# Patient Record
Sex: Male | Born: 1945 | Race: White | Hispanic: No | Marital: Married | Smoking: Former smoker
Health system: Southern US, Community
[De-identification: ages and names within clinical notes are randomized; demographics above are authoritative.]

## PROBLEM LIST (undated history)

## (undated) DIAGNOSIS — F4310 Post-traumatic stress disorder, unspecified: Secondary | ICD-10-CM

## (undated) DIAGNOSIS — E119 Type 2 diabetes mellitus without complications: Secondary | ICD-10-CM

## (undated) DIAGNOSIS — K7689 Other specified diseases of liver: Secondary | ICD-10-CM

## (undated) DIAGNOSIS — R911 Solitary pulmonary nodule: Secondary | ICD-10-CM

## (undated) DIAGNOSIS — K644 Residual hemorrhoidal skin tags: Secondary | ICD-10-CM

## (undated) DIAGNOSIS — M778 Other enthesopathies, not elsewhere classified: Secondary | ICD-10-CM

## (undated) DIAGNOSIS — M47812 Spondylosis without myelopathy or radiculopathy, cervical region: Secondary | ICD-10-CM

## (undated) DIAGNOSIS — I700 Atherosclerosis of aorta: Secondary | ICD-10-CM

## (undated) DIAGNOSIS — Z860100 Personal history of colon polyps, unspecified: Secondary | ICD-10-CM

## (undated) DIAGNOSIS — K759 Inflammatory liver disease, unspecified: Secondary | ICD-10-CM

## (undated) DIAGNOSIS — C4491 Basal cell carcinoma of skin, unspecified: Secondary | ICD-10-CM

## (undated) DIAGNOSIS — I10 Essential (primary) hypertension: Secondary | ICD-10-CM

## (undated) DIAGNOSIS — I639 Cerebral infarction, unspecified: Secondary | ICD-10-CM

## (undated) DIAGNOSIS — F419 Anxiety disorder, unspecified: Secondary | ICD-10-CM

## (undated) DIAGNOSIS — M4802 Spinal stenosis, cervical region: Secondary | ICD-10-CM

## (undated) DIAGNOSIS — M1990 Unspecified osteoarthritis, unspecified site: Secondary | ICD-10-CM

## (undated) DIAGNOSIS — R252 Cramp and spasm: Secondary | ICD-10-CM

## (undated) DIAGNOSIS — M7581 Other shoulder lesions, right shoulder: Secondary | ICD-10-CM

## (undated) DIAGNOSIS — C61 Malignant neoplasm of prostate: Secondary | ICD-10-CM

## (undated) DIAGNOSIS — G4730 Sleep apnea, unspecified: Secondary | ICD-10-CM

## (undated) DIAGNOSIS — M5127 Other intervertebral disc displacement, lumbosacral region: Secondary | ICD-10-CM

## (undated) DIAGNOSIS — I739 Peripheral vascular disease, unspecified: Secondary | ICD-10-CM

## (undated) DIAGNOSIS — I6529 Occlusion and stenosis of unspecified carotid artery: Secondary | ICD-10-CM

## (undated) DIAGNOSIS — G709 Myoneural disorder, unspecified: Secondary | ICD-10-CM

## (undated) DIAGNOSIS — M5030 Other cervical disc degeneration, unspecified cervical region: Secondary | ICD-10-CM

## (undated) HISTORY — DX: Type 2 diabetes mellitus without complications: E11.9

## (undated) HISTORY — PX: TRIGGER FINGER RELEASE: SHX641

## (undated) HISTORY — PX: EYE SURGERY: SHX253

## (undated) HISTORY — DX: Occlusion and stenosis of unspecified carotid artery: I65.29

---

## 1964-05-18 HISTORY — PX: OTHER SURGICAL HISTORY: SHX169

## 2001-02-17 ENCOUNTER — Other Ambulatory Visit: Admission: RE | Admit: 2001-02-17 | Discharge: 2001-02-17 | Payer: Self-pay | Admitting: Dermatology

## 2002-10-13 ENCOUNTER — Encounter: Payer: Self-pay | Admitting: Family Medicine

## 2002-10-13 ENCOUNTER — Ambulatory Visit (HOSPITAL_COMMUNITY): Admission: RE | Admit: 2002-10-13 | Discharge: 2002-10-13 | Payer: Self-pay | Admitting: Family Medicine

## 2005-05-22 DIAGNOSIS — K219 Gastro-esophageal reflux disease without esophagitis: Secondary | ICD-10-CM | POA: Insufficient documentation

## 2009-05-18 HISTORY — PX: COLONOSCOPY W/ POLYPECTOMY: SHX1380

## 2009-06-17 ENCOUNTER — Other Ambulatory Visit: Admission: RE | Admit: 2009-06-17 | Discharge: 2009-06-17 | Payer: Self-pay | Admitting: Orthopaedic Surgery

## 2009-08-13 ENCOUNTER — Ambulatory Visit (HOSPITAL_COMMUNITY): Admission: RE | Admit: 2009-08-13 | Discharge: 2009-08-13 | Payer: Self-pay | Admitting: Family Medicine

## 2011-07-09 ENCOUNTER — Encounter (HOSPITAL_COMMUNITY): Payer: Self-pay | Admitting: Pharmacy Technician

## 2011-07-14 ENCOUNTER — Encounter (HOSPITAL_COMMUNITY): Payer: Self-pay

## 2011-07-14 ENCOUNTER — Encounter (HOSPITAL_COMMUNITY)
Admission: RE | Admit: 2011-07-14 | Discharge: 2011-07-14 | Disposition: A | Discharge status: Home / Self Care | Payer: Medicare Other | Source: Ambulatory Visit | Attending: Ophthalmology | Admitting: Ophthalmology

## 2011-07-14 HISTORY — DX: Essential (primary) hypertension: I10

## 2011-07-14 HISTORY — DX: Sleep apnea, unspecified: G47.30

## 2011-07-14 HISTORY — DX: Cramp and spasm: R25.2

## 2011-07-14 HISTORY — DX: Unspecified osteoarthritis, unspecified site: M19.90

## 2011-07-14 LAB — HEMOGLOBIN AND HEMATOCRIT, BLOOD
HCT: 48.6 % (ref 39.0–52.0)
Hemoglobin: 17 g/dL (ref 13.0–17.0)

## 2011-07-14 LAB — BASIC METABOLIC PANEL
Calcium: 10.9 mg/dL — ABNORMAL HIGH (ref 8.4–10.5)
Creatinine, Ser: 0.93 mg/dL (ref 0.50–1.35)
GFR calc Af Amer: >90 mL/min (ref >90)
GFR calc non Af Amer: 85 mL/min — ABNORMAL LOW (ref >90)

## 2011-07-14 MED ORDER — CYCLOPENTOLATE-PHENYLEPHRINE 0.2-1 % OP SOLN
OPHTHALMIC | Status: AC
  Filled 2011-07-14: qty 2

## 2011-07-14 NOTE — Progress Notes (Signed)
07/14/11 1131  OBSTRUCTIVE SLEEP APNEA  Have you ever been diagnosed with sleep apnea through a sleep study? No  Do you snore loudly (loud enough to be heard through closed doors)?  1  Do you often feel tired, fatigued, or sleepy during the daytime? 0  Has anyone observed you stop breathing during your sleep? 1  Do you have, or are you being treated for high blood pressure? 1  BMI more than 35 kg/m2? 0  Age over 68 years old? 1  Neck circumference greater than 40 cm/18 inches? 0  Gender: 1  Obstructive Sleep Apnea Score 5

## 2011-07-14 NOTE — Pre-Procedure Instructions (Signed)
Pt had a recent EKG from Jan 2013 done at Dr. Lamar Blinks office that he brought with him to pre-op interview. It was shown to Dr. Jayme Cloud and he said he was fine with [place] using it. The pt's label sticker was placed on the EKG and it was put with the pt's chart for his upcoming cataract surgery.

## 2011-07-14 NOTE — Patient Instructions (Signed)
Robert Zhang  07/14/2011   Your procedure is scheduled on:  07/20/11  Report to [facility] at 09:00 AM.  Call this number if you have problems the morning of surgery: [phone]   Remember:   Do not eat food:After Midnight.  May have clear liquids:until Midnight .  Clear liquids include soda, tea, black coffee, apple or grape juice, broth.  Take these medicines the morning of surgery with A SIP OF WATER: Benicar-HCT.   Do not wear jewelry, make-up or nail polish.  Do not wear lotions, powders, or perfumes. You may wear deodorant.  Do not shave 48 hours prior to surgery.  Do not bring valuables to the hospital.  Contacts, dentures or bridgework may not be worn into surgery.  Leave suitcase in the car. After surgery it may be brought to your room.  For patients admitted to the hospital, checkout time is 11:00 AM the day of discharge.   Patients discharged the day of surgery will not be allowed to drive home.  Name and phone number of your driver:   Special Instructions: CHG Shower Use Special Wash: 1/2 bottle night before surgery and 1/2 bottle morning of surgery.   Please read over the following fact sheets that you were given: Anesthesia Post-op Instructions and Care and Recovery After Surgery    Cataract A cataract is a clouding of the lens of the eye. It is most often related to aging. A cataract is not a "film" over the surface of the eye. The lens is inside the eye and changes size of the pupil. The lens can enlarge to let more light enter the eye in dark environments and contract the size of the pupil to let in bright light. The lens is the part of the eye that helps focus light on the retina. The retina is the eye's light-sensitive layer. It is in the back of the eye that sends visual signals to the brain. In a normal eye, light passes through the lens and gets focused on the retina. To help produce a sharp image, the lens must remain clear. When a lens becomes cloudy, vision is  compromised by the degree and nature of the clouding. Certain cataracts make people more near-sighted as they develop, others increase glare, and all reduce vision to some degree or another. A cataract that is so dense that it becomes milky Robert Zhang and a Robert Zhang opacity can be seen through the pupil. When the Robert Zhang color is seen, it is called a "mature" or "hyper-mature cataract." Such cataracts cause total blindness in the affected eye. The cataract must be removed to prevent damage to the eye itself. Some types of cataracts can cause a secondary disease of the eye, such as certain types of glaucoma. In the early stages, better lighting and eyeglasses may lessen vision problems caused by cataracts. At a certain point, surgery may be needed to improve vision. CAUSES   Aging. However, cataracts may occur at any age, even in newborns.   Certain drugs.   Trauma to the eye.   Certain diseases (such as diabetes).   Inherited or acquired medical syndromes.  SYMPTOMS   Gradual, progressive drop in vision in the affected eye. Cataracts may develop at different rates in each eye. Cataracts may even be in just one eye with the other unaffected.   Cataracts due to trauma may develop quickly, sometimes over a matter or days or even hours. The result is severe and rapid visual loss.  DIAGNOSIS  To detect a cataract, an eye doctor examines the lens. A well developed cataract can be diagnosed without dilating the pupil. Early cataracts and others of a specific nature are best diagnosed with an exam of the eyes with the pupils dilated by drops. TREATMENT   For an early cataract, vision may improve by using different eyeglasses or stronger lighting.   If the above measures do not help, surgery is the only effective treatment. This treatment removes the cloudy lens and replaces it with a substitute lens (Intraocular lens, or IOL). Newly developed IOL technology allows the implanted lens to improve vision both at a  distance and up close. Discuss with your eye surgeon about the possibility of still needing glasses. Also discuss how visual coordination between both eyes will be affected.  A cataract needs to be removed only when vision loss interferes with your everyday activities such as driving, reading or watching TV. You and your eye doctor can make that decision together. In most cases, waiting until you are ready to have cataract surgery will not harm your eye. If you have cataracts in both eyes, only one should be removed at a time. This allows the operated eye to heal and be out of danger from serious problems (such as infection or poor wound healing) before having the other eye undergo surgery.  Sometimes, a cataract should be removed even if it does not cause problems with your vision. For example, a cataract should be removed if it prevents examination or treatment of another eye problem. Just as you cannot see out of the affected eye well, your doctor cannot see into your eye well through a cataract. The vast majority of people who have cataract surgery have better vision afterward. CATARACT REMOVAL There are two primary ways to remove a cataract. Your doctor can explain the differences and help determine which is best for you:  Phacoemulsification (small incision cataract surgery). This involves making a small cut (incision) on the edge of the clear, dome-shaped surface that covers the front of the eye (the cornea). An injection behind the eye or eye drops are given to make this a painless procedure. The doctor then inserts a tiny probe into the eye. This device emits ultrasound waves that soften and break up the cloudy center of the lens so it can be removed by suction. Most cataract surgery is done this way. The cuts are usually so small and performed in such a manner that often no sutures are needed to keep it closed.   Extracapsular surgery. Your doctor makes a slightly longer incision on the side of  the cornea. The doctor removes the hard center of the lens. The remainder of the lens is then removed by suction. In some cases, extremely fine sutures are needed which the doctor may, or may not remove in the office after the surgery.  When an IOL is implanted, it needs no care. It becomes a permanent part of your eye and cannot be seen or felt.  Some people cannot have an IOL. They may have problems during surgery, or maybe they have another eye disease. For these people, a soft contact lens may be suggested. If an IOL or contact lens cannot be used, very powerful and thick glasses are required after surgery. Since vision is very different through such thick glasses, it is important to have your doctor discuss the impact on your vision after any cataract surgery where there is no plan to implant an IOL. The normal lens  of the eye is covered by a clear capsule. Both phacoemulsification and extracapsular surgery require that the back surface of this lens capsule be left in place. This helps support IOLs and prevents the IOL from dislocating and falling back into the deeper interior of the eye. Right after surgery, and often permanently this "posterior capsule" remains clear. In some cases however, it can become cloudy, presenting the same type of visual compromise that the original cataract did since light is again obstructed as it passes through the clear IOL. This condition is often referred to as an "after-cataract." Fortunately, after-cataracts are easily treated using a painless and very fast laser treatment that is performed without anesthesia or incisions. It is done in a matter of minutes in an outpatient environment. Visual improvement is often immediate.  HOME CARE INSTRUCTIONS   Your surgeon will discuss pre and post operative care with you prior to surgery. The majority of people are able to do almost all normal activities right away. Although, it is often advised to avoid strenuous activity for a  period of time.   Postoperative drops and careful avoidance of infection will be needed. Many surgeons suggest the use of a protective shield during the first few days after surgery.   There is a very small incidence of complication from modern cataract surgery, but it can happen. Infection that spreads to the inside of the eye (endophthalmitis) can result in total visual loss and even loss of the eye itself. In extremely rare instances, the inflammation of endophthalmitis can spread to both eyes (sympathetic ophthalmia). Appropriate post-operative care under the close observation of your surgeon is essential to a successful outcome.  SEEK IMMEDIATE MEDICAL CARE IF:   You have any sudden drop of vision in the operated eye.   You have pain in the operated eye.   You see a large number of floating dots in the field of vision in the operated eye.   You see flashing lights, or if a portion of your side vision in any direction appears black (like a curtain being drawn into your field of vision) in the operated eye.  Document Released: 05/04/2005 Document Revised: 01/14/2011 Document Reviewed: 06/20/2007 [facility] Patient Information 2012 [place], [place].   PATIENT INSTRUCTIONS POST-ANESTHESIA  IMMEDIATELY FOLLOWING SURGERY:  Do not drive or operate machinery for the first twenty four hours after surgery.  Do not make any important decisions for twenty four hours after surgery or while taking narcotic pain medications or sedatives.  If you develop intractable nausea and vomiting or a severe headache please notify your doctor immediately.  FOLLOW-UP:  Please make an appointment with your surgeon as instructed. You do not need to follow up with anesthesia unless specifically instructed to do so.  WOUND CARE INSTRUCTIONS (if applicable):  Keep a dry clean dressing on the anesthesia/puncture wound site if there is drainage.  Once the wound has quit draining you may leave it open to air.  Generally you  should leave the bandage intact for twenty four hours unless there is drainage.  If the epidural site drains for more than 36-48 hours please call the anesthesia department.  QUESTIONS?:  Please feel free to call your physician or the hospital operator if you have any questions, and they will be happy to assist you.     [facility] Anesthesia Department [address] [place] [phone]

## 2011-07-20 ENCOUNTER — Ambulatory Visit (HOSPITAL_COMMUNITY): Payer: Medicare Other | Admitting: Anesthesiology

## 2011-07-20 ENCOUNTER — Encounter (HOSPITAL_COMMUNITY): Payer: Self-pay

## 2011-07-20 ENCOUNTER — Encounter (HOSPITAL_COMMUNITY)
Admission: RE | Disposition: A | Discharge status: Home / Self Care | Payer: Self-pay | Source: Ambulatory Visit | Attending: Ophthalmology

## 2011-07-20 ENCOUNTER — Ambulatory Visit (HOSPITAL_COMMUNITY)
Admission: RE | Admit: 2011-07-20 | Discharge: 2011-07-20 | Disposition: A | Discharge status: Home / Self Care | Payer: Medicare Other | Source: Ambulatory Visit | Attending: Ophthalmology | Admitting: Ophthalmology

## 2011-07-20 ENCOUNTER — Encounter (HOSPITAL_COMMUNITY): Payer: Self-pay | Admitting: Anesthesiology

## 2011-07-20 DIAGNOSIS — Z79899 Other long term (current) drug therapy: Secondary | ICD-10-CM | POA: Insufficient documentation

## 2011-07-20 DIAGNOSIS — I10 Essential (primary) hypertension: Secondary | ICD-10-CM | POA: Insufficient documentation

## 2011-07-20 DIAGNOSIS — Z01812 Encounter for preprocedural laboratory examination: Secondary | ICD-10-CM | POA: Insufficient documentation

## 2011-07-20 DIAGNOSIS — H2510 Age-related nuclear cataract, unspecified eye: Secondary | ICD-10-CM | POA: Insufficient documentation

## 2011-07-20 DIAGNOSIS — Z01810 Encounter for preprocedural cardiovascular examination: Secondary | ICD-10-CM | POA: Insufficient documentation

## 2011-07-20 HISTORY — PX: CATARACT EXTRACTION W/PHACO: SHX586

## 2011-07-20 SURGERY — PHACOEMULSIFICATION, CATARACT, WITH IOL INSERTION
Anesthesia: Monitor Anesthesia Care | Site: Eye | Laterality: Right | Wound class: Clean

## 2011-07-20 MED ORDER — FENTANYL CITRATE 0.05 MG/ML IJ SOLN
INTRAMUSCULAR | Status: AC
  Filled 2011-07-20: qty 2

## 2011-07-20 MED ORDER — EPINEPHRINE HCL 1 MG/ML IJ SOLN
INTRAMUSCULAR | Status: AC
  Filled 2011-07-20: qty 1

## 2011-07-20 MED ORDER — BSS IO SOLN
INTRAOCULAR | Status: DC | PRN
  Administered 2011-07-20: 15 mL via INTRAOCULAR

## 2011-07-20 MED ORDER — MIDAZOLAM HCL 2 MG/2ML IJ SOLN
1.0000 mg | INTRAMUSCULAR | Status: DC | PRN
  Administered 2011-07-20 (×2): 2 mg via INTRAVENOUS

## 2011-07-20 MED ORDER — MIDAZOLAM HCL 2 MG/2ML IJ SOLN
INTRAMUSCULAR | Status: AC
  Filled 2011-07-20: qty 2

## 2011-07-20 MED ORDER — NA HYALUR & NA CHOND-NA HYALUR 0.55-0.5 ML IO KIT
PACK | INTRAOCULAR | Status: DC | PRN
  Administered 2011-07-20: 1 via OPHTHALMIC

## 2011-07-20 MED ORDER — CYCLOPENTOLATE-PHENYLEPHRINE 0.2-1 % OP SOLN
1.0000 [drp] | Freq: Once | OPHTHALMIC | Status: AC
  Administered 2011-07-20: 1 [drp] via OPHTHALMIC

## 2011-07-20 MED ORDER — LIDOCAINE HCL 3.5 % OP GEL
OPHTHALMIC | Status: AC
  Filled 2011-07-20: qty 5

## 2011-07-20 MED ORDER — GATIFLOXACIN 0.5 % OP SOLN OPTIME - NO CHARGE
OPHTHALMIC | Status: DC | PRN
  Administered 2011-07-20: 1 [drp] via OPHTHALMIC

## 2011-07-20 MED ORDER — MIDAZOLAM HCL 2 MG/2ML IJ SOLN
INTRAMUSCULAR | Status: AC
  Administered 2011-07-20: 2 mg via INTRAVENOUS
  Filled 2011-07-20: qty 2

## 2011-07-20 MED ORDER — LIDOCAINE HCL 3.5 % OP GEL: 1.0000 "application " | Freq: Once | OPHTHALMIC | Status: DC

## 2011-07-20 MED ORDER — TETRACAINE HCL 0.5 % OP SOLN
1.0000 [drp] | Freq: Once | OPHTHALMIC | Status: AC
  Administered 2011-07-20: 1 [drp] via OPHTHALMIC

## 2011-07-20 MED ORDER — FENTANYL CITRATE 0.05 MG/ML IJ SOLN
INTRAMUSCULAR | Status: DC | PRN
  Administered 2011-07-20 (×4): 25 ug via INTRAVENOUS

## 2011-07-20 MED ORDER — TETRACAINE HCL 0.5 % OP SOLN
OPHTHALMIC | Status: AC
  Administered 2011-07-20: 1 [drp] via OPHTHALMIC
  Filled 2011-07-20: qty 2

## 2011-07-20 MED ORDER — KETOROLAC TROMETHAMINE 0.4 % OP SOLN - NO CHARGE
1.0000 [drp] | Freq: Once | OPHTHALMIC | Status: AC
  Administered 2011-07-20: 1 [drp] via OPHTHALMIC
  Filled 2011-07-20: qty 5

## 2011-07-20 MED ORDER — LIDOCAINE 3.5 % OP GEL OPTIME - NO CHARGE
OPHTHALMIC | Status: DC | PRN
  Administered 2011-07-20: 1 [drp] via OPHTHALMIC

## 2011-07-20 MED ORDER — LACTATED RINGERS IV SOLN
INTRAVENOUS | Status: DC
  Administered 2011-07-20: 08:00:00 via INTRAVENOUS

## 2011-07-20 MED ORDER — TETRACAINE 0.5 % OP SOLN OPTIME - NO CHARGE
OPHTHALMIC | Status: DC | PRN
  Administered 2011-07-20: 1 [drp] via OPHTHALMIC

## 2011-07-20 MED ORDER — GATIFLOXACIN 0.5 % OP SOLN OPTIME - NO CHARGE
1.0000 [drp] | Freq: Once | OPHTHALMIC | Status: AC
  Administered 2011-07-20: 1 [drp] via OPHTHALMIC
  Filled 2011-07-20: qty 2.5

## 2011-07-20 MED ORDER — EPINEPHRINE HCL 1 MG/ML IJ SOLN
INTRAOCULAR | Status: DC | PRN
  Administered 2011-07-20: 09:00:00

## 2011-07-20 SURGICAL SUPPLY — 27 items
CAPSULAR TENSION RING-AMO (OPHTHALMIC RELATED) IMPLANT
CLOTH BEACON ORANGE TIMEOUT ST (SAFETY) ×2 IMPLANT
GLOVE BIO SURGEON STRL SZ7.5 (GLOVE) IMPLANT
GLOVE BIOGEL M 6.5 STRL (GLOVE) IMPLANT
GLOVE BIOGEL PI IND STRL 6.5 (GLOVE) IMPLANT
GLOVE BIOGEL PI IND STRL 7.0 (GLOVE) ×1 IMPLANT
GLOVE BIOGEL PI INDICATOR 6.5 (GLOVE)
GLOVE BIOGEL PI INDICATOR 7.0 (GLOVE) ×1
GLOVE ECLIPSE 6.5 STRL STRAW (GLOVE) IMPLANT
GLOVE ECLIPSE 7.5 STRL STRAW (GLOVE) IMPLANT
GLOVE EXAM NITRILE LRG STRL (GLOVE) IMPLANT
GLOVE EXAM NITRILE MD LF STRL (GLOVE) ×2 IMPLANT
GLOVE SKINSENSE NS SZ6.5 (GLOVE)
GLOVE SKINSENSE NS SZ7.0 (GLOVE)
GLOVE SKINSENSE STRL SZ6.5 (GLOVE) IMPLANT
GLOVE SKINSENSE STRL SZ7.0 (GLOVE) IMPLANT
INST SET CATARACT ~~LOC~~ (KITS) ×2 IMPLANT
KIT VITRECTOMY (OPHTHALMIC RELATED) IMPLANT
PAD ARMBOARD 7.5X6 YLW CONV (MISCELLANEOUS) ×2 IMPLANT
PROC W NO LENS (INTRAOCULAR LENS)
PROC W SPEC LENS (INTRAOCULAR LENS)
PROCESS W NO LENS (INTRAOCULAR LENS) IMPLANT
PROCESS W SPEC LENS (INTRAOCULAR LENS) IMPLANT
RING MALYGIN (MISCELLANEOUS) IMPLANT
SIGHTPATH CAT PROC W REG LENS (Ophthalmic Related) ×2 IMPLANT
VISCOELASTIC ADDITIONAL (OPHTHALMIC RELATED) IMPLANT
WATER STERILE IRR 250ML POUR (IV SOLUTION) ×2 IMPLANT

## 2011-07-20 NOTE — Anesthesia Procedure Notes (Signed)
Procedure Name: MAC Date/Time: 07/20/2011 8:29 AM Performed by: Minerva Areola Pre-anesthesia Checklist: Patient identified, Emergency Drugs available, Suction available, Timeout performed and Patient being monitored Patient Re-evaluated:Patient Re-evaluated prior to inductionOxygen Delivery Method: Nasal Cannula

## 2011-07-20 NOTE — Op Note (Signed)
See scanned op note done in another system

## 2011-07-20 NOTE — Transfer of Care (Signed)
Immediate Anesthesia Transfer of Care Note  Patient: Robert Zhang  Procedure(s) Performed: Procedure(s) (LRB): CATARACT EXTRACTION PHACO AND INTRAOCULAR LENS PLACEMENT (IOC) (Right)  Patient Location: Shortstay  Anesthesia Type: MAC  Level of Consciousness: awake  Airway & Oxygen Therapy: Patient Spontanous Breathing   Post-op Assessment: Report given to PACU RN, Post -op Vital signs reviewed and stable and Patient moving all extremities  Post vital signs: Reviewed and stable  Complications: No apparent anesthesia complications   

## 2011-07-20 NOTE — Anesthesia Preprocedure Evaluation (Signed)
Anesthesia Evaluation  Patient identified by MRN, date of birth, ID band Patient awake    Airway Mallampati: II      Dental  (+) Teeth Intact   Pulmonary  breath sounds clear to auscultation        Cardiovascular Rhythm:Regular Rate:Normal     Neuro/Psych    GI/Hepatic   Endo/Other    Renal/GU      Musculoskeletal   Abdominal   Peds  Hematology   Anesthesia Other Findings   Reproductive/Obstetrics                           Anesthesia Physical Anesthesia Plan  ASA: II  Anesthesia Plan: MAC   Post-op Pain Management:    Induction: Intravenous  Airway Management Planned: Nasal Cannula  Additional Equipment:   Intra-op Plan:   Post-operative Plan:   Informed Consent: I have reviewed the patients History and Physical, chart, labs and discussed the procedure including the risks, benefits and alternatives for the proposed anesthesia with the patient or authorized representative who has indicated his/her understanding and acceptance.     Plan Discussed with:   Anesthesia Plan Comments:         Anesthesia Quick Evaluation

## 2011-07-20 NOTE — Brief Op Note (Signed)
07/20/2011  9:19 AM  PATIENT:  Cleda Mccreedy  66 y.o. male  PRE-OPERATIVE DIAGNOSIS:  Surgical Cataract Right Eye  POST-OPERATIVE DIAGNOSIS:  Surgical Cataract Right Eye  PROCEDURE:  Procedure(s): CATARACT EXTRACTION PHACO AND INTRAOCULAR LENS PLACEMENT (IOC)  SURGEON:  Surgeon(s): Susa Simmonds, MD  ASSISTANTS: Valda Lamb, CST   ANESTHESIA STAFF: Minerva Areola, CRNA - CRNA Laurene Footman, MD - Anesthesiologist  ANESTHESIA:   topical/MAC  REQUESTED LENS POWER: 22.0  LENS IMPLANT INFORMATION:  Alcon SN60 WF  S/n:  [id number]  Exp 09/2015  CUMULATIVE DISSIPATED ENERGY:25.73  INDICATIONS:  See H&P  OP FINDINGS:dense NS  COMPLICATIONS:None  DICTATION #: see scanned op note  PLAN OF CARE: see H&P  PATIENT DISPOSITION:  Short Stay

## 2011-07-20 NOTE — Anesthesia Postprocedure Evaluation (Deleted)
Immediate Anesthesia Transfer of Care Note  Patient: Robert Zhang  Procedure(s) Performed: Procedure(s) (LRB): CATARACT EXTRACTION PHACO AND INTRAOCULAR LENS PLACEMENT (IOC) (Right)  Patient Location: Shortstay  Anesthesia Type: MAC  Level of Consciousness: awake  Airway & Oxygen Therapy: Patient Spontanous Breathing   Post-op Assessment: Report given to PACU RN, Post -op Vital signs reviewed and stable and Patient moving all extremities  Post vital signs: Reviewed and stable  Complications: No apparent anesthesia complications

## 2011-07-20 NOTE — H&P (Signed)
The patient was examined and interviewed without significant changes.  Please see full H&P from office.

## 2011-07-20 NOTE — Anesthesia Postprocedure Evaluation (Signed)
  Anesthesia Post-op Note  Patient: Robert Zhang  Procedure(s) Performed: Procedure(s) (LRB): CATARACT EXTRACTION PHACO AND INTRAOCULAR LENS PLACEMENT (IOC) (Right)  Patient Location:  Short Stay  Anesthesia Type: MAC  Level of Consciousness: awake  Airway and Oxygen Therapy: Patient Spontanous Breathing  Post-op Pain: none  Post-op Assessment: Post-op Vital signs reviewed, Patient's Cardiovascular Status Stable, Respiratory Function Stable, Patent Airway, No signs of Nausea or vomiting and Pain level controlled  Post-op Vital Signs: Reviewed and stable  Complications: No apparent anesthesia complications

## 2011-07-22 ENCOUNTER — Encounter (HOSPITAL_COMMUNITY): Payer: Self-pay | Admitting: Ophthalmology

## 2011-08-21 ENCOUNTER — Encounter (HOSPITAL_COMMUNITY): Payer: Self-pay

## 2011-08-24 NOTE — Patient Instructions (Addendum)
18 Robert Zhang  08/24/2011   Your procedure is scheduled on:   08/31/2011  Report to [facility] at  800  AM.  Call this number if you have problems the morning of surgery: [phone]   Remember:   Do not eat food:After Midnight.  May have clear liquids:until Midnight .  Clear liquids include soda, tea, black coffee, apple or grape juice, broth.  Take these medicines the morning of surgery with A SIP OF WATER:  benicar   Do not wear jewelry, make-up or nail polish.  Do not wear lotions, powders, or perfumes. You may wear deodorant.  Do not shave 48 hours prior to surgery.  Do not bring valuables to the hospital.  Contacts, dentures or bridgework may not be worn into surgery.  Leave suitcase in the car. After surgery it may be brought to your room.  For patients admitted to the hospital, checkout time is 11:00 AM the day of discharge.   Patients discharged the day of surgery will not be allowed to drive home.  Name and phone number of your driver: family  Special Instructions: N/A   Please read over the following fact sheets that you were given: Pain Booklet, Surgical Site Infection Prevention, Anesthesia Post-op Instructions and Care and Recovery After Surgery Cataract A cataract is a clouding of the lens of the eye. When a lens becomes cloudy, vision is reduced based on the degree and nature of the clouding. Many cataracts reduce vision to some degree. Some cataracts make people more near-sighted as they develop. Other cataracts increase glare. Cataracts that are ignored and become worse can sometimes look white. The white color can be seen through the pupil. CAUSES   Aging. However, cataracts may occur at any age, even in newborns.   Certain drugs.   Trauma to the eye.   Certain diseases such as diabetes.   Specific eye diseases such as chronic inflammation inside the eye or a sudden attack of a rare form of glaucoma.   Inherited or acquired medical problems.  SYMPTOMS    Gradual, progressive drop in vision in the affected eye.   Severe, rapid visual loss. This most often happens when trauma is the cause.  DIAGNOSIS  To detect a cataract, an eye doctor examines the lens. Cataracts are best diagnosed with an exam of the eyes with the pupils enlarged (dilated) by drops.  TREATMENT  For an early cataract, vision may improve by using different eyeglasses or stronger lighting. If that does not help your vision, surgery is the only effective treatment. A cataract needs to be surgically removed when vision loss interferes with your everyday activities, such as driving, reading, or watching TV. A cataract may also have to be removed if it prevents examination or treatment of another eye problem. Surgery removes the cloudy lens and usually replaces it with a substitute lens (intraocular lens, IOL).  At a time when both you and your doctor agree, the cataract will be surgically removed. If you have cataracts in both eyes, only one is usually removed at a time. This allows the operated eye to heal and be out of danger from any possible problems after surgery (such as infection or poor wound healing). In rare cases, a cataract may be doing damage to your eye. In these cases, your caregiver may advise surgical removal right away. The vast majority of people who have cataract surgery have better vision afterward. HOME CARE INSTRUCTIONS  If you are not planning surgery, you may  be asked to do the following:  Use different eyeglasses.   Use stronger or brighter lighting.   Ask your eye doctor about reducing your medicine dose or changing medicines if it is thought that a medicine caused your cataract. Changing medicines does not make the cataract go away on its own.   Become familiar with your surroundings. Poor vision can lead to injury. Avoid bumping into things on the affected side. You are at a higher risk for tripping or falling.   Exercise extreme care when driving or  operating machinery.   Wear sunglasses if you are sensitive to bright light or experiencing problems with glare.  SEEK IMMEDIATE MEDICAL CARE IF:   You have a worsening or sudden vision loss.   You notice redness, swelling, or increasing pain in the eye.   You have a fever.  Document Released: 05/04/2005 Document Revised: 04/23/2011 Document Reviewed: 12/26/2010 [facility] Patient Information 2012 [place].PATIENT INSTRUCTIONS POST-ANESTHESIA  IMMEDIATELY FOLLOWING SURGERY:  Do not drive or operate machinery for the first twenty four hours after surgery.  Do not make any important decisions for twenty four hours after surgery or while taking narcotic pain medications or sedatives.  If you develop intractable nausea and vomiting or a severe headache please notify your doctor immediately.  FOLLOW-UP:  Please make an appointment with your surgeon as instructed. You do not need to follow up with anesthesia unless specifically instructed to do so.  WOUND CARE INSTRUCTIONS (if applicable):  Keep a dry clean dressing on the anesthesia/puncture wound site if there is drainage.  Once the wound has quit draining you may leave it open to air.  Generally you should leave the bandage intact for twenty four hours unless there is drainage.  If the epidural site drains for more than 36-48 hours please call the anesthesia department.  QUESTIONS?:  Please feel free to call your physician or the hospital operator if you have any questions, and they will be happy to assist you.

## 2011-08-25 ENCOUNTER — Encounter (HOSPITAL_COMMUNITY)
Admission: RE | Admit: 2011-08-25 | Discharge: 2011-08-25 | Disposition: A | Discharge status: Home / Self Care | Payer: Medicare Other | Source: Ambulatory Visit | Attending: Ophthalmology | Admitting: Ophthalmology

## 2011-08-25 ENCOUNTER — Encounter (HOSPITAL_COMMUNITY): Payer: Self-pay

## 2011-08-25 MED ORDER — CYCLOPENTOLATE-PHENYLEPHRINE 0.2-1 % OP SOLN
OPHTHALMIC | Status: AC
  Filled 2011-08-25: qty 2

## 2011-08-28 MED ORDER — LIDOCAINE HCL 3.5 % OP GEL
OPHTHALMIC | Status: AC
  Filled 2011-08-28: qty 5

## 2011-08-28 MED ORDER — TETRACAINE HCL 0.5 % OP SOLN
OPHTHALMIC | Status: AC
  Filled 2011-08-28: qty 2

## 2011-08-31 ENCOUNTER — Encounter (HOSPITAL_COMMUNITY): Payer: Self-pay | Admitting: Anesthesiology

## 2011-08-31 ENCOUNTER — Encounter (HOSPITAL_COMMUNITY)
Admission: RE | Disposition: A | Discharge status: Home / Self Care | Payer: Self-pay | Source: Ambulatory Visit | Attending: Ophthalmology

## 2011-08-31 ENCOUNTER — Encounter (HOSPITAL_COMMUNITY): Payer: Self-pay | Admitting: *Deleted

## 2011-08-31 ENCOUNTER — Ambulatory Visit (HOSPITAL_COMMUNITY): Payer: Medicare Other | Admitting: Anesthesiology

## 2011-08-31 ENCOUNTER — Ambulatory Visit (HOSPITAL_COMMUNITY)
Admission: RE | Admit: 2011-08-31 | Discharge: 2011-08-31 | Disposition: A | Discharge status: Home / Self Care | Payer: Medicare Other | Source: Ambulatory Visit | Attending: Ophthalmology | Admitting: Ophthalmology

## 2011-08-31 DIAGNOSIS — Z01812 Encounter for preprocedural laboratory examination: Secondary | ICD-10-CM | POA: Insufficient documentation

## 2011-08-31 DIAGNOSIS — Z01810 Encounter for preprocedural cardiovascular examination: Secondary | ICD-10-CM | POA: Insufficient documentation

## 2011-08-31 DIAGNOSIS — Z79899 Other long term (current) drug therapy: Secondary | ICD-10-CM | POA: Insufficient documentation

## 2011-08-31 DIAGNOSIS — H2510 Age-related nuclear cataract, unspecified eye: Secondary | ICD-10-CM | POA: Insufficient documentation

## 2011-08-31 DIAGNOSIS — I10 Essential (primary) hypertension: Secondary | ICD-10-CM | POA: Insufficient documentation

## 2011-08-31 HISTORY — PX: CATARACT EXTRACTION W/PHACO: SHX586

## 2011-08-31 SURGERY — PHACOEMULSIFICATION, CATARACT, WITH IOL INSERTION
Anesthesia: Monitor Anesthesia Care | Site: Eye | Laterality: Left | Wound class: Clean

## 2011-08-31 MED ORDER — GATIFLOXACIN 0.5 % OP SOLN OPTIME - NO CHARGE
1.0000 [drp] | Freq: Once | OPHTHALMIC | Status: AC
  Administered 2011-08-31: 1 [drp] via OPHTHALMIC
  Filled 2011-08-31: qty 2.5

## 2011-08-31 MED ORDER — MIDAZOLAM HCL 2 MG/2ML IJ SOLN
1.0000 mg | INTRAMUSCULAR | Status: DC | PRN
Start: 2011-08-31 — End: 2011-08-31
  Administered 2011-08-31: 2 mg via INTRAVENOUS

## 2011-08-31 MED ORDER — MIDAZOLAM HCL 2 MG/2ML IJ SOLN
INTRAMUSCULAR | Status: AC
  Administered 2011-08-31: 2 mg via INTRAVENOUS
  Filled 2011-08-31: qty 2

## 2011-08-31 MED ORDER — EPINEPHRINE HCL 1 MG/ML IJ SOLN
INTRAOCULAR | Status: DC | PRN
  Administered 2011-08-31: 09:00:00

## 2011-08-31 MED ORDER — KETOROLAC TROMETHAMINE 0.4 % OP SOLN - NO CHARGE
1.0000 [drp] | Freq: Once | OPHTHALMIC | Status: AC
  Administered 2011-08-31: 1 [drp] via OPHTHALMIC
  Filled 2011-08-31: qty 5

## 2011-08-31 MED ORDER — ONDANSETRON HCL 4 MG/2ML IJ SOLN: 4.0000 mg | Freq: Once | INTRAMUSCULAR | Status: DC | PRN

## 2011-08-31 MED ORDER — FENTANYL CITRATE 0.05 MG/ML IJ SOLN: 25.0000 ug | INTRAMUSCULAR | Status: DC | PRN

## 2011-08-31 MED ORDER — MOXIFLOXACIN HCL 0.5 % OP SOLN - NO CHARGE
1.0000 [drp] | Freq: Once | OPHTHALMIC | Status: AC
  Administered 2011-08-31: 1 [drp] via OPHTHALMIC
  Filled 2011-08-31: qty 3

## 2011-08-31 MED ORDER — LIDOCAINE 3.5 % OP GEL OPTIME - NO CHARGE
OPHTHALMIC | Status: DC | PRN
  Administered 2011-08-31: 1 [drp] via OPHTHALMIC

## 2011-08-31 MED ORDER — TETRACAINE 0.5 % OP SOLN OPTIME - NO CHARGE
OPHTHALMIC | Status: DC | PRN
  Administered 2011-08-31: 1 [drp] via OPHTHALMIC

## 2011-08-31 MED ORDER — NA HYALUR & NA CHOND-NA HYALUR 0.55-0.5 ML IO KIT
PACK | INTRAOCULAR | Status: DC | PRN
  Administered 2011-08-31: 1 via OPHTHALMIC

## 2011-08-31 MED ORDER — MIDAZOLAM HCL 5 MG/5ML IJ SOLN
INTRAMUSCULAR | Status: DC | PRN
  Administered 2011-08-31: 2 mg via INTRAVENOUS

## 2011-08-31 MED ORDER — LACTATED RINGERS IV SOLN
INTRAVENOUS | Status: DC
  Administered 2011-08-31: 09:00:00 via INTRAVENOUS

## 2011-08-31 MED ORDER — GATIFLOXACIN 0.5 % OP SOLN OPTIME - NO CHARGE
OPHTHALMIC | Status: DC | PRN
  Administered 2011-08-31: 1 [drp] via OPHTHALMIC

## 2011-08-31 MED ORDER — BSS IO SOLN
INTRAOCULAR | Status: DC | PRN
  Administered 2011-08-31: 15 mL via INTRAOCULAR

## 2011-08-31 MED ORDER — MIDAZOLAM HCL 2 MG/2ML IJ SOLN
INTRAMUSCULAR | Status: AC
  Filled 2011-08-31: qty 2

## 2011-08-31 MED ORDER — TETRACAINE HCL 0.5 % OP SOLN
1.0000 [drp] | Freq: Once | OPHTHALMIC | Status: AC
  Administered 2011-08-31: 1 [drp] via OPHTHALMIC

## 2011-08-31 MED ORDER — EPINEPHRINE HCL 1 MG/ML IJ SOLN
INTRAMUSCULAR | Status: AC
  Filled 2011-08-31: qty 1

## 2011-08-31 SURGICAL SUPPLY — 27 items
CAPSULAR TENSION RING-AMO (OPHTHALMIC RELATED) IMPLANT
CLOTH BEACON ORANGE TIMEOUT ST (SAFETY) ×2 IMPLANT
GLOVE BIO SURGEON STRL SZ7.5 (GLOVE) IMPLANT
GLOVE BIOGEL M 6.5 STRL (GLOVE) IMPLANT
GLOVE BIOGEL PI IND STRL 6.5 (GLOVE) ×1 IMPLANT
GLOVE BIOGEL PI IND STRL 7.0 (GLOVE) IMPLANT
GLOVE BIOGEL PI INDICATOR 6.5 (GLOVE) ×1
GLOVE BIOGEL PI INDICATOR 7.0 (GLOVE)
GLOVE ECLIPSE 6.5 STRL STRAW (GLOVE) IMPLANT
GLOVE ECLIPSE 7.5 STRL STRAW (GLOVE) IMPLANT
GLOVE EXAM NITRILE LRG STRL (GLOVE) IMPLANT
GLOVE EXAM NITRILE MD LF STRL (GLOVE) ×2 IMPLANT
GLOVE SKINSENSE NS SZ6.5 (GLOVE)
GLOVE SKINSENSE NS SZ7.0 (GLOVE)
GLOVE SKINSENSE STRL SZ6.5 (GLOVE) IMPLANT
GLOVE SKINSENSE STRL SZ7.0 (GLOVE) IMPLANT
INST SET CATARACT ~~LOC~~ (KITS) ×2 IMPLANT
KIT VITRECTOMY (OPHTHALMIC RELATED) IMPLANT
PAD ARMBOARD 7.5X6 YLW CONV (MISCELLANEOUS) ×2 IMPLANT
PROC W NO LENS (INTRAOCULAR LENS)
PROC W SPEC LENS (INTRAOCULAR LENS)
PROCESS W NO LENS (INTRAOCULAR LENS) IMPLANT
PROCESS W SPEC LENS (INTRAOCULAR LENS) IMPLANT
RING MALYGIN (MISCELLANEOUS) IMPLANT
SIGHTPATH CAT PROC W REG LENS (Ophthalmic Related) ×2 IMPLANT
VISCOELASTIC ADDITIONAL (OPHTHALMIC RELATED) IMPLANT
WATER STERILE IRR 250ML POUR (IV SOLUTION) ×2 IMPLANT

## 2011-08-31 NOTE — H&P (Signed)
I have reviewed the pre printed H&P, the patient was re-examined, and I have identified no significant interval changes in the patient's medical condition.  There is no change in the plan of care since the history and physical of record. 

## 2011-08-31 NOTE — Transfer of Care (Signed)
Immediate Anesthesia Transfer of Care Note  Patient: Robert Zhang  Procedure(s) Performed: Procedure(s) (LRB): CATARACT EXTRACTION PHACO AND INTRAOCULAR LENS PLACEMENT (IOC) (Left)  Patient Location: Shortstay  Anesthesia Type: MAC  Level of Consciousness: awake  Airway & Oxygen Therapy: Patient Spontanous Breathing   Post-op Assessment: Report given to PACU RN, Post -op Vital signs reviewed and stable and Patient moving all extremities  Post vital signs: Reviewed and stable  Complications: No apparent anesthesia complications

## 2011-08-31 NOTE — Anesthesia Postprocedure Evaluation (Signed)
  Anesthesia Post-op Note  Patient: Robert Zhang  Procedure(s) Performed: Procedure(s) (LRB): CATARACT EXTRACTION PHACO AND INTRAOCULAR LENS PLACEMENT (IOC) (Left)  Patient Location:  Short Stay  Anesthesia Type: MAC  Level of Consciousness: awake  Airway and Oxygen Therapy: Patient Spontanous Breathing  Post-op Pain: none  Post-op Assessment: Post-op Vital signs reviewed, Patient's Cardiovascular Status Stable, Respiratory Function Stable, Patent Airway, No signs of Nausea or vomiting and Pain level controlled  Post-op Vital Signs: Reviewed and stable  Complications: No apparent anesthesia complications

## 2011-08-31 NOTE — Anesthesia Preprocedure Evaluation (Signed)
Anesthesia Evaluation  Patient identified by MRN, date of birth, ID band Patient awake    Airway Mallampati: II      Dental  (+) Teeth Intact   Pulmonary  breath sounds clear to auscultation        Cardiovascular hypertension, Pt. on medications Rhythm:Regular Rate:Normal     Neuro/Psych    GI/Hepatic   Endo/Other    Renal/GU      Musculoskeletal   Abdominal   Peds  Hematology   Anesthesia Other Findings   Reproductive/Obstetrics                           Anesthesia Physical Anesthesia Plan  ASA: II  Anesthesia Plan: MAC   Post-op Pain Management:    Induction: Intravenous  Airway Management Planned: Nasal Cannula  Additional Equipment:   Intra-op Plan:   Post-operative Plan:   Informed Consent: I have reviewed the patients History and Physical, chart, labs and discussed the procedure including the risks, benefits and alternatives for the proposed anesthesia with the patient or authorized representative who has indicated his/her understanding and acceptance.     Plan Discussed with:   Anesthesia Plan Comments:         Anesthesia Quick Evaluation

## 2011-08-31 NOTE — Op Note (Signed)
See scanned op note done today in another system 

## 2011-08-31 NOTE — Anesthesia Procedure Notes (Signed)
Procedure Name: MAC Date/Time: 08/31/2011 9:16 AM Performed by: Franco Nones Pre-anesthesia Checklist: Patient identified, Emergency Drugs available, Suction available, Timeout performed and Patient being monitored Patient Re-evaluated:Patient Re-evaluated prior to inductionOxygen Delivery Method: Nasal Cannula

## 2011-08-31 NOTE — Brief Op Note (Signed)
DATE: 08/31/2011   TIME: 9:48 AM   PATIENT:  Robert Zhang, 66 y.o., male   PRE-OPERATIVE DIAGNOSIS: nuclear cataract left eye    POST-OPERATIVE DIAGNOSIS:  nuclear cataract left eye    PROCEDURE(S):  Procedure(s): CATARACT EXTRACTION PHACO AND INTRAOCULAR LENS PLACEMENT (IOC)    SURGEON:  Surgeon(s) and Role:    * Susa Simmonds, MD - Primary    ASSISTANT:  Cyndie Chime, RN - Circulator Hurshel Party, CST - Scrub Person Kirstie Peri, RN - Circulator Assistant Lizabeth Leyden, RN - Relief Circulator Cyndie Chime, RN - Circulator    ANESTHESIA: Topical and MAC   FINDINGS:  Dense nuclear cataract   IMPLANTS:  Implant Name Type Inv. Item Serial No. Manufacturer Lot No. LRB No. Used Action  SIGHTPATH CAT PROC W REG LENS - [id number] Ophthalmic Related SIGHTPATH CAT PROC W REG LENS [id number] SIGHTPATH   Left 1 Implanted      INDICATIONS: See H&P    PLAN OF CARE:  Discharge home per discharge instructions

## 2011-08-31 NOTE — Discharge Instructions (Signed)
Robert Zhang 08/31/2011 Dr. Lita Mains Post operative Instructions for Cataract Patients  These instructions are for Robert Zhang and pertain to the operative eye.  1.  Resume your normal diet and previous oral medicines.  2. Your Follow-up appointment is at Dr. Lita Mains' office in [place] on 09/01/11 at 10:45.  3. You may leave the hospital when your driver is present and your nurse releases you.  4. Begin Pred Forte (prednisolone acetate 1%), Acular LS (ketorolac tromethamine .4%) and Gatifloxacin 0.5% eye drops; 1 drop each 4 times daily to operative eye. Begin 3 hours after discharge from Short Stay Unit.  Moxifloxacin 0.5% may be substituted for Gatifloxacin using the same instructions.  5. Page Dr. Lita Mains via beeper [phone] for significant pain in or around operative eye that is not relieved by Tylenol.  6. If you took Plavix before surgery, restart it at the usual dose on the evening of surgery.  7. Wear dark glasses as necessary for excessive light sensitivity.  8. Do no forcefully rub you your operative eye.  9. Keep your operative eye dry for 1 week. You may gently clean your eyelids with a damp washcloth.  10. You may resume normal occupational activities in one week and resume driving as tolerated after the first post operative visit.  11. It is normal to have blurred vision and a scratchy sensation following surgery.  Dr. Lita Mains: [phone]

## 2011-09-02 ENCOUNTER — Encounter (HOSPITAL_COMMUNITY): Payer: Self-pay | Admitting: Ophthalmology

## 2012-03-21 ENCOUNTER — Other Ambulatory Visit (HOSPITAL_COMMUNITY): Payer: Self-pay | Admitting: Family Medicine

## 2012-03-21 DIAGNOSIS — Z139 Encounter for screening, unspecified: Secondary | ICD-10-CM

## 2012-03-22 ENCOUNTER — Ambulatory Visit (HOSPITAL_COMMUNITY)
Admission: RE | Admit: 2012-03-22 | Discharge: 2012-03-22 | Disposition: A | Discharge status: Home / Self Care | Payer: Medicare Other | Source: Ambulatory Visit | Attending: Family Medicine | Admitting: Family Medicine

## 2012-03-22 DIAGNOSIS — Z139 Encounter for screening, unspecified: Secondary | ICD-10-CM

## 2012-03-22 DIAGNOSIS — I7090 Unspecified atherosclerosis: Secondary | ICD-10-CM | POA: Insufficient documentation

## 2012-03-24 ENCOUNTER — Other Ambulatory Visit (HOSPITAL_COMMUNITY): Payer: Self-pay | Admitting: Family Medicine

## 2012-03-24 DIAGNOSIS — M5137 Other intervertebral disc degeneration, lumbosacral region: Secondary | ICD-10-CM

## 2012-03-25 ENCOUNTER — Ambulatory Visit (HOSPITAL_COMMUNITY)
Admission: RE | Admit: 2012-03-25 | Discharge: 2012-03-25 | Disposition: A | Discharge status: Home / Self Care | Payer: Medicare Other | Source: Ambulatory Visit | Attending: Family Medicine | Admitting: Family Medicine

## 2012-03-25 DIAGNOSIS — M79609 Pain in unspecified limb: Secondary | ICD-10-CM | POA: Insufficient documentation

## 2012-03-25 DIAGNOSIS — M545 Low back pain, unspecified: Secondary | ICD-10-CM | POA: Insufficient documentation

## 2012-03-25 DIAGNOSIS — M5137 Other intervertebral disc degeneration, lumbosacral region: Secondary | ICD-10-CM

## 2012-03-25 DIAGNOSIS — M5126 Other intervertebral disc displacement, lumbar region: Secondary | ICD-10-CM | POA: Insufficient documentation

## 2012-08-22 ENCOUNTER — Ambulatory Visit (HOSPITAL_COMMUNITY)
Admission: RE | Admit: 2012-08-22 | Discharge: 2012-08-22 | Disposition: A | Discharge status: Home / Self Care | Payer: Medicare Other | Source: Ambulatory Visit | Attending: Family Medicine | Admitting: Family Medicine

## 2012-08-22 ENCOUNTER — Other Ambulatory Visit (HOSPITAL_COMMUNITY): Payer: Self-pay | Admitting: Family Medicine

## 2012-08-22 DIAGNOSIS — M7989 Other specified soft tissue disorders: Secondary | ICD-10-CM | POA: Insufficient documentation

## 2012-08-22 DIAGNOSIS — M898X9 Other specified disorders of bone, unspecified site: Secondary | ICD-10-CM | POA: Insufficient documentation

## 2012-08-22 DIAGNOSIS — M79609 Pain in unspecified limb: Secondary | ICD-10-CM | POA: Insufficient documentation

## 2012-08-22 DIAGNOSIS — M109 Gout, unspecified: Secondary | ICD-10-CM

## 2014-05-18 DIAGNOSIS — I639 Cerebral infarction, unspecified: Secondary | ICD-10-CM

## 2014-05-18 HISTORY — DX: Cerebral infarction, unspecified: I63.9

## 2014-06-20 DIAGNOSIS — L821 Other seborrheic keratosis: Secondary | ICD-10-CM | POA: Diagnosis not present

## 2014-06-20 DIAGNOSIS — Z1283 Encounter for screening for malignant neoplasm of skin: Secondary | ICD-10-CM | POA: Diagnosis not present

## 2014-11-02 DIAGNOSIS — I10 Essential (primary) hypertension: Secondary | ICD-10-CM | POA: Diagnosis not present

## 2014-11-02 DIAGNOSIS — Z6826 Body mass index (BMI) 26.0-26.9, adult: Secondary | ICD-10-CM | POA: Diagnosis not present

## 2014-11-02 DIAGNOSIS — E119 Type 2 diabetes mellitus without complications: Secondary | ICD-10-CM | POA: Diagnosis not present

## 2014-11-02 DIAGNOSIS — E782 Mixed hyperlipidemia: Secondary | ICD-10-CM | POA: Diagnosis not present

## 2014-11-06 DIAGNOSIS — I10 Essential (primary) hypertension: Secondary | ICD-10-CM | POA: Diagnosis not present

## 2014-11-06 DIAGNOSIS — M79645 Pain in left finger(s): Secondary | ICD-10-CM | POA: Diagnosis not present

## 2014-11-06 DIAGNOSIS — Z6827 Body mass index (BMI) 27.0-27.9, adult: Secondary | ICD-10-CM | POA: Diagnosis not present

## 2014-11-21 DIAGNOSIS — M151 Heberden's nodes (with arthropathy): Secondary | ICD-10-CM | POA: Diagnosis not present

## 2014-11-21 DIAGNOSIS — M24542 Contracture, left hand: Secondary | ICD-10-CM | POA: Diagnosis not present

## 2014-12-07 DIAGNOSIS — E663 Overweight: Secondary | ICD-10-CM | POA: Diagnosis not present

## 2014-12-07 DIAGNOSIS — M5030 Other cervical disc degeneration, unspecified cervical region: Secondary | ICD-10-CM | POA: Diagnosis not present

## 2014-12-07 DIAGNOSIS — Z1389 Encounter for screening for other disorder: Secondary | ICD-10-CM | POA: Diagnosis not present

## 2014-12-07 DIAGNOSIS — M542 Cervicalgia: Secondary | ICD-10-CM | POA: Diagnosis not present

## 2014-12-07 DIAGNOSIS — Z6826 Body mass index (BMI) 26.0-26.9, adult: Secondary | ICD-10-CM | POA: Diagnosis not present

## 2014-12-24 DIAGNOSIS — E119 Type 2 diabetes mellitus without complications: Secondary | ICD-10-CM | POA: Diagnosis not present

## 2014-12-25 DIAGNOSIS — E119 Type 2 diabetes mellitus without complications: Secondary | ICD-10-CM | POA: Diagnosis not present

## 2014-12-26 ENCOUNTER — Other Ambulatory Visit (HOSPITAL_COMMUNITY): Payer: Self-pay | Admitting: Family Medicine

## 2014-12-26 DIAGNOSIS — M542 Cervicalgia: Secondary | ICD-10-CM

## 2014-12-26 DIAGNOSIS — M5030 Other cervical disc degeneration, unspecified cervical region: Secondary | ICD-10-CM

## 2014-12-26 DIAGNOSIS — Z1389 Encounter for screening for other disorder: Secondary | ICD-10-CM

## 2015-01-09 ENCOUNTER — Ambulatory Visit (HOSPITAL_COMMUNITY)
Admission: RE | Admit: 2015-01-09 | Discharge: 2015-01-09 | Disposition: A | Discharge status: Home / Self Care | Payer: Medicare Other | Source: Ambulatory Visit | Attending: Family Medicine | Admitting: Family Medicine

## 2015-01-09 DIAGNOSIS — M542 Cervicalgia: Secondary | ICD-10-CM

## 2015-01-09 DIAGNOSIS — M5030 Other cervical disc degeneration, unspecified cervical region: Secondary | ICD-10-CM

## 2015-01-09 DIAGNOSIS — Z1389 Encounter for screening for other disorder: Secondary | ICD-10-CM

## 2015-01-09 DIAGNOSIS — M4802 Spinal stenosis, cervical region: Secondary | ICD-10-CM | POA: Diagnosis not present

## 2015-01-09 DIAGNOSIS — I6523 Occlusion and stenosis of bilateral carotid arteries: Secondary | ICD-10-CM | POA: Diagnosis not present

## 2015-02-11 ENCOUNTER — Encounter: Payer: Self-pay | Admitting: Surgery

## 2015-02-11 DIAGNOSIS — M5020 Other cervical disc displacement, unspecified cervical region: Secondary | ICD-10-CM | POA: Insufficient documentation

## 2015-02-11 DIAGNOSIS — M542 Cervicalgia: Secondary | ICD-10-CM | POA: Insufficient documentation

## 2015-02-11 DIAGNOSIS — M5022 Other cervical disc displacement, mid-cervical region: Secondary | ICD-10-CM | POA: Diagnosis not present

## 2015-02-11 DIAGNOSIS — Z6826 Body mass index (BMI) 26.0-26.9, adult: Secondary | ICD-10-CM | POA: Diagnosis not present

## 2015-02-11 DIAGNOSIS — I10 Essential (primary) hypertension: Secondary | ICD-10-CM | POA: Diagnosis not present

## 2015-03-06 ENCOUNTER — Encounter: Payer: Self-pay | Admitting: Surgery

## 2015-03-11 ENCOUNTER — Encounter: Payer: Self-pay | Admitting: Surgery

## 2015-03-11 ENCOUNTER — Ambulatory Visit (INDEPENDENT_AMBULATORY_CARE_PROVIDER_SITE_OTHER): Payer: Medicare Other | Admitting: Surgery

## 2015-03-11 ENCOUNTER — Other Ambulatory Visit (HOSPITAL_COMMUNITY)
Admission: RE | Admit: 2015-03-11 | Discharge: 2015-03-11 | Disposition: A | Discharge status: Home / Self Care | Payer: Medicare Other | Source: Ambulatory Visit | Attending: Surgery | Admitting: Surgery

## 2015-03-11 VITALS — BP 170/102 | HR 75 | Ht 68.0 in | Wt 173.4 lb

## 2015-03-11 DIAGNOSIS — Z01812 Encounter for preprocedural laboratory examination: Secondary | ICD-10-CM | POA: Insufficient documentation

## 2015-03-11 DIAGNOSIS — I6523 Occlusion and stenosis of bilateral carotid arteries: Secondary | ICD-10-CM | POA: Diagnosis not present

## 2015-03-11 LAB — CREATININE, SERUM: Creatinine, Ser: 1.04 mg/dL (ref 0.61–1.24)

## 2015-03-11 NOTE — Progress Notes (Signed)
Patient name: Robert Zhang MRN: [id number] DOB: 01-23-1946 Sex: male   Referred by: Dr. Hilma Favors  Reason for referral:  Chief Complaint  Patient presents with  . New Evaluation    eval carotid stenosis     HISTORY OF PRESENT ILLNESS: This is a very pleasant 69 year old gentleman who is referred to me for evaluation of carotid disease.  The patient states that approximately 3 months ago he had a episode where his left lip went numb.  This lasted for about 1 minute.  Several days later he had a second event where his left lip went numb as well as his left foot.  This also lasted about 1 minute and then completely resolved.  He has not had any symptoms since.  He denies having any weakness.  He denies amaurosis fugax.  He denies slurred speech.  The patient suffers from diabetes.  His most recent hemoglobin A1c was 6.3.  He suffers from hypercholesterolemia.  He is not on a statin as he initially declined using a statin and focused on diet and exercise.  His most recent LDL cholesterol was 83.  He suffers from PTSD as well as hepatitis C.  Past Medical History  Diagnosis Date  . Hypertension   . Leg cramps   . Sleep apnea     Stop Bang score of 5  . Arthritis     bilateral legs /   Neck-Degenerative Disc Disease  . Carotid artery occlusion     Right Carotid   . Diabetes mellitus without complication [facility])     Past Surgical History  Procedure Laterality Date  . Colonoscopy w/ polypectomy  2011    [facility]-Dr. Rehman  . Head reconstruction  1966    due to head injury in war  . Cataract extraction w/phaco  07/20/2011    Procedure: CATARACT EXTRACTION PHACO AND INTRAOCULAR LENS PLACEMENT (IOC);  Surgeon: Williams Che, MD;  Location: [facility];  Service: Ophthalmology;  Laterality: Right;  CDE=25.73  . Cataract extraction w/phaco  08/31/2011    Procedure: CATARACT EXTRACTION PHACO AND INTRAOCULAR LENS PLACEMENT (IOC);  Surgeon: Williams Che, MD;  Location: [facility];   Service: Ophthalmology;  Laterality: Left;  CDE: 38.59  . Trigger finger release      Social History   Social History  . Marital Status: Married    Spouse Name: N/A  . Number of Children: N/A  . Years of Education: N/A   Occupational History  . Not on file.   Social History Main Topics  . Smoking status: Former Smoker    Quit date: 05/18/1993  . Smokeless tobacco: Never Used  . Alcohol Use: 2.4 - 3.6 oz/week    4-6 Shots of liquor per week     Comment: 2-3 mixed drinks on a saturday night  . Drug Use: No  . Sexual Activity: Not on file   Other Topics Concern  . Not on file   Social History Narrative    Family History  Problem Relation Age of Onset  . Anesthesia problems Neg Hx   . Hypotension Neg Hx   . Malignant hyperthermia Neg Hx   . Pseudochol deficiency Neg Hx   . Diabetes Mother   . Hypertension Father   . Heart disease Father     before age 49  . Heart attack Father     Allergies as of 03/11/2015 - Review Complete 03/11/2015  Allergen Reaction Noted  . Penicillins Rash and Other (See Comments)  07/09/2011    Current Outpatient Prescriptions on File Prior to Visit  Medication Sig Dispense Refill  . aspirin 81 MG tablet Take 81 mg by mouth daily.    . Cholecalciferol (VITAMIN D3) 5000 UNITS CAPS Take 1 capsule by mouth daily.    . naproxen sodium (ANAPROX) 220 MG tablet Take 220 mg by mouth 2 (two) times daily as needed. For pain    . tadalafil (CIALIS) 5 MG tablet Take 5 mg by mouth daily as needed for erectile dysfunction.    . fenofibrate 160 MG tablet Take 160 mg by mouth daily.    Javier Docker Oil 300 MG CAPS Take 1 capsule by mouth daily.    . potassium chloride SA (K-DUR,KLOR-CON) 20 MEQ tablet Take 20 mEq by mouth 2 (two) times daily.     No current facility-administered medications on file prior to visit.     REVIEW OF SYSTEMS: Cardiovascular: No chest pain, chest pressure, palpitations, orthopnea, or dyspnea on exertion. No claudication or rest  pain,  No history of DVT or phlebitis. Pulmonary: No productive cough, asthma or wheezing. Neurologic: See history of present illness Hematologic: No bleeding problems or clotting disorders. Musculoskeletal: No joint pain or joint swelling. Gastrointestinal: No blood in stool or hematemesis Genitourinary: No dysuria or hematuria. Psychiatric:: No history of major depression. Integumentary: No rashes or ulcers. Constitutional: No fever or chills.  PHYSICAL EXAMINATION:  Filed Vitals:   03/11/15 0929 03/11/15 0933  BP: 190/94 170/102  Pulse: 75   Height: 5\' 8"  (1.727 m)   Weight: 173 lb 6.4 oz (78.654 kg)   SpO2: 99%    Body mass index is 26.37 kg/(m^2). General: The patient appears their stated age.   HEENT:  No gross abnormalities Pulmonary: Respirations are non-labored Abdomen: Soft and non-tender.  No pulsatile mass  Musculoskeletal: There are no major deformities.   Neurologic: No focal weakness or paresthesias are detected, Skin: There are no ulcer or rashes noted. Psychiatric: The patient has normal affect. Cardiovascular: There is a regular rate and rhythm without significant murmur appreciated.  Palpable pedal pulses.  No carotid bruit.  Diagnostic Studies: I have reviewed his outside Doppler studies which show 50-69% bilateral carotid stenosis   Assessment:  Bilateral carotid stenosis Plan: With the patient's symptoms 3 months ago, I am concerned that this could be related to his carotid occlusive disease.  However his stenosis is in the 50-69 percent range.  Before putting him through an operation, I would like to get more information regarding his stenosis as well as the plaque morphology.  Therefore, I'm sending him for CT angiogram of the head and neck.  If he appears to have an irregular or ulcerated plaque, I feel it be reasonable to proceed with right carotid endarterectomy.  Certainly if the brain CT scan shows evidence of an old stroke that would further  support proceed with carotid endarterectomy.  In the meantime, we will focus on medical management.  He will continue with his current medication profile.  Consideration for starting a statin was discussed.  If I elected not to proceed with surgery, we could consider dual agent antiplatelet therapy     V. Leia Alf, M.D. [facility] Office: [phone] Pager:  [phone]

## 2015-03-11 NOTE — Addendum Note (Signed)
Addended by: Dorthula Rue L on: 03/11/2015 10:59 AM   Modules accepted: Orders

## 2015-03-18 ENCOUNTER — Ambulatory Visit (HOSPITAL_COMMUNITY)
Admission: RE | Admit: 2015-03-18 | Discharge: 2015-03-18 | Disposition: A | Discharge status: Home / Self Care | Payer: Medicare Other | Source: Ambulatory Visit | Attending: Surgery | Admitting: Surgery

## 2015-03-18 DIAGNOSIS — I771 Stricture of artery: Secondary | ICD-10-CM | POA: Insufficient documentation

## 2015-03-18 DIAGNOSIS — I6622 Occlusion and stenosis of left posterior cerebral artery: Secondary | ICD-10-CM | POA: Diagnosis not present

## 2015-03-18 DIAGNOSIS — M542 Cervicalgia: Secondary | ICD-10-CM | POA: Insufficient documentation

## 2015-03-18 DIAGNOSIS — I6522 Occlusion and stenosis of left carotid artery: Secondary | ICD-10-CM | POA: Diagnosis not present

## 2015-03-18 DIAGNOSIS — I6602 Occlusion and stenosis of left middle cerebral artery: Secondary | ICD-10-CM | POA: Insufficient documentation

## 2015-03-18 DIAGNOSIS — I6521 Occlusion and stenosis of right carotid artery: Secondary | ICD-10-CM | POA: Diagnosis not present

## 2015-03-18 DIAGNOSIS — I6523 Occlusion and stenosis of bilateral carotid arteries: Secondary | ICD-10-CM

## 2015-03-18 MED ORDER — IOHEXOL 350 MG/ML SOLN
100.0000 mL | Freq: Once | INTRAVENOUS | Status: AC | PRN
  Administered 2015-03-18: 75 mL via INTRAVENOUS

## 2015-03-28 ENCOUNTER — Encounter: Payer: Self-pay | Admitting: Surgery

## 2015-04-01 ENCOUNTER — Ambulatory Visit (INDEPENDENT_AMBULATORY_CARE_PROVIDER_SITE_OTHER): Payer: Medicare Other | Admitting: Surgery

## 2015-04-01 ENCOUNTER — Encounter: Payer: Self-pay | Admitting: Surgery

## 2015-04-01 VITALS — BP 178/105 | HR 102 | Ht 68.0 in | Wt 173.1 lb

## 2015-04-01 DIAGNOSIS — I6523 Occlusion and stenosis of bilateral carotid arteries: Secondary | ICD-10-CM | POA: Diagnosis not present

## 2015-04-01 NOTE — Progress Notes (Signed)
Patient name: Robert Zhang MRN: [id number] DOB: [birth year] Sex: male     Chief Complaint  Patient presents with  . Re-evaluation    1-2 wk f/u CTA head/neck prior    HISTORY OF PRESENT ILLNESS: The patient is back for follow-up after his CT scan of his neck and head.  Approximate 3 months ago he had an episode where his left lip went numb.  This lasted about a minute.  He had a second event where his left lip and left foot went numb, this also lasting approximately 1 minute.  He has had no other symptoms.  His carotid Doppler study showed 50-69 percent stenosis.  I elected to send him for a CT scan to better evaluate the morphology of the plaque in the carotid arteries and to determine if he had had a stroke in the past.  The patient suffers from diabetes. His most recent hemoglobin A1c was 6.3. He suffers from hypercholesterolemia. He is not on a statin as he initially declined using a statin and focused on diet and exercise. His most recent LDL cholesterol was 83. He suffers from PTSD as well as hepatitis C.   Past Medical History  Diagnosis Date  . Hypertension   . Leg cramps   . Sleep apnea     Stop Bang score of 5  . Arthritis     bilateral legs /   Neck-Degenerative Disc Disease  . Carotid artery occlusion     Right Carotid   . Diabetes mellitus without complication [facility])     Past Surgical History  Procedure Laterality Date  . Colonoscopy w/ polypectomy  2011    [facility]-Dr. Rehman  . Head reconstruction  1966    due to head injury in war  . Cataract extraction w/phaco  07/20/2011    Procedure: CATARACT EXTRACTION PHACO AND INTRAOCULAR LENS PLACEMENT (IOC);  Surgeon: Williams Che, MD;  Location: [facility];  Service: Ophthalmology;  Laterality: Right;  CDE=25.73  . Cataract extraction w/phaco  08/31/2011    Procedure: CATARACT EXTRACTION PHACO AND INTRAOCULAR LENS PLACEMENT (IOC);  Surgeon: Williams Che, MD;  Location: [facility];  Service: Ophthalmology;   Laterality: Left;  CDE: 38.59  . Trigger finger release      Social History   Social History  . Marital Status: Married    Spouse Name: N/A  . Number of Children: N/A  . Years of Education: N/A   Occupational History  . Not on file.   Social History Main Topics  . Smoking status: Former Smoker    Quit date: 05/18/1993  . Smokeless tobacco: Never Used  . Alcohol Use: 2.4 - 3.6 oz/week    4-6 Shots of liquor per week     Comment: 2-3 mixed drinks on a saturday night  . Drug Use: No  . Sexual Activity: Not on file   Other Topics Concern  . Not on file   Social History Narrative    Family History  Problem Relation Age of Onset  . Anesthesia problems Neg Hx   . Hypotension Neg Hx   . Malignant hyperthermia Neg Hx   . Pseudochol deficiency Neg Hx   . Diabetes Mother   . Hypertension Father   . Heart disease Father     before age 39  . Heart attack Father     Allergies as of 04/01/2015 - Review Complete 04/01/2015  Allergen Reaction Noted  . Penicillins Rash and Other (See Comments) 07/09/2011  Current Outpatient Prescriptions on File Prior to Visit  Medication Sig Dispense Refill  . aspirin 81 MG tablet Take 81 mg by mouth daily.    . Cholecalciferol (VITAMIN D3) 5000 UNITS CAPS Take 1 capsule by mouth daily.    . fenofibrate 160 MG tablet Take 160 mg by mouth daily.    Javier Docker Oil 300 MG CAPS Take 1 capsule by mouth daily.    . naproxen sodium (ANAPROX) 220 MG tablet Take 220 mg by mouth 2 (two) times daily as needed. For pain    . olmesartan-hydrochlorothiazide (BENICAR HCT) 20-12.5 MG tablet Take 1 tablet by mouth daily.    . potassium chloride SA (K-DUR,KLOR-CON) 20 MEQ tablet Take 20 mEq by mouth 2 (two) times daily as needed.     . tadalafil (CIALIS) 5 MG tablet Take 5 mg by mouth daily as needed for erectile dysfunction.     No current facility-administered medications on file prior to visit.     REVIEW OF SYSTEMS: No changes from visit last  week   PHYSICAL EXAMINATION:   Vital signs are  Filed Vitals:   04/01/15 1137 04/01/15 1139  BP: 190/106 178/105  Pulse: 102   Height: 5\' 8"  (1.727 m)   Weight: 173 lb 1.6 oz (78.518 kg)   SpO2: 97%    Body mass index is 26.33 kg/(m^2). General: The patient appears their stated age. HEENT:  No gross abnormalities Pulmonary:  Non labored breathing  Musculoskeletal: There are no major deformities. Neurologic: No focal weakness or paresthesias are detected, Skin: There are no ulcer or rashes noted. Psychiatric: The patient has normal affect.    Diagnostic Studies I have reviewed his CT scan with the following findings: 1. 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 2. There is also moderate to severe more distal left PCA stenosis. No major circle of Willis branch occlusion. 3. But otherwise mild intracranial and mild for age extracranial atherosclerosis. There is confluent soft plaque in the proximal left subclavian artery, but hemodynamically significant stenosis in the proximal great vessels or neck. Dominant left vertebral artery.  Assessment:  carotid stenosis Plan:  the CT scan does not show significant cervical carotid disease which would warrant revascularization.  The intracranial CT scan shows a small left MCA infarct.  I would recommend medical management at this time.  Consideration for dual antiplatelet therapy.  Also consideration for initiation of statin therapy despite his good cholesterol profile  I have him scheduled for follow-up in one year with a repeat carotid ultrasound  V. Leia Alf, M.D. [facility] [place] Office: [phone] Pager:  [phone]

## 2015-04-02 NOTE — Addendum Note (Signed)
Addended by: Dorthula Rue L on: 04/02/2015 03:30 PM   Modules accepted: Orders

## 2015-04-10 DIAGNOSIS — R7309 Other abnormal glucose: Secondary | ICD-10-CM | POA: Diagnosis not present

## 2015-04-10 DIAGNOSIS — I10 Essential (primary) hypertension: Secondary | ICD-10-CM | POA: Diagnosis not present

## 2015-04-10 DIAGNOSIS — Z6826 Body mass index (BMI) 26.0-26.9, adult: Secondary | ICD-10-CM | POA: Diagnosis not present

## 2015-04-10 DIAGNOSIS — E782 Mixed hyperlipidemia: Secondary | ICD-10-CM | POA: Diagnosis not present

## 2015-04-10 DIAGNOSIS — E663 Overweight: Secondary | ICD-10-CM | POA: Diagnosis not present

## 2015-04-10 DIAGNOSIS — Z1389 Encounter for screening for other disorder: Secondary | ICD-10-CM | POA: Diagnosis not present

## 2015-04-10 DIAGNOSIS — Z0000 Encounter for general adult medical examination without abnormal findings: Secondary | ICD-10-CM | POA: Diagnosis not present

## 2015-04-10 DIAGNOSIS — I708 Atherosclerosis of other arteries: Secondary | ICD-10-CM | POA: Diagnosis not present

## 2015-05-06 DIAGNOSIS — I10 Essential (primary) hypertension: Secondary | ICD-10-CM | POA: Diagnosis not present

## 2015-05-06 DIAGNOSIS — Z1389 Encounter for screening for other disorder: Secondary | ICD-10-CM | POA: Diagnosis not present

## 2015-05-06 DIAGNOSIS — Z0000 Encounter for general adult medical examination without abnormal findings: Secondary | ICD-10-CM | POA: Diagnosis not present

## 2015-05-06 DIAGNOSIS — R7309 Other abnormal glucose: Secondary | ICD-10-CM | POA: Diagnosis not present

## 2015-05-06 DIAGNOSIS — E782 Mixed hyperlipidemia: Secondary | ICD-10-CM | POA: Diagnosis not present

## 2015-12-26 DIAGNOSIS — M542 Cervicalgia: Secondary | ICD-10-CM | POA: Diagnosis not present

## 2015-12-26 DIAGNOSIS — I10 Essential (primary) hypertension: Secondary | ICD-10-CM | POA: Diagnosis not present

## 2016-01-02 ENCOUNTER — Other Ambulatory Visit: Payer: Self-pay | Admitting: Neurosurgery

## 2016-01-02 DIAGNOSIS — M25511 Pain in right shoulder: Secondary | ICD-10-CM

## 2016-01-02 DIAGNOSIS — M542 Cervicalgia: Secondary | ICD-10-CM

## 2016-01-12 ENCOUNTER — Ambulatory Visit
Admission: RE | Admit: 2016-01-12 | Discharge: 2016-01-12 | Disposition: A | Discharge status: Home / Self Care | Payer: Medicare Other | Source: Ambulatory Visit | Attending: Neurosurgery | Admitting: Neurosurgery

## 2016-01-12 DIAGNOSIS — M25511 Pain in right shoulder: Secondary | ICD-10-CM

## 2016-01-13 ENCOUNTER — Ambulatory Visit
Admission: RE | Admit: 2016-01-13 | Discharge: 2016-01-13 | Disposition: A | Discharge status: Home / Self Care | Payer: Medicare Other | Source: Ambulatory Visit | Attending: Neurosurgery | Admitting: Neurosurgery

## 2016-01-13 DIAGNOSIS — M4802 Spinal stenosis, cervical region: Secondary | ICD-10-CM | POA: Diagnosis not present

## 2016-01-13 DIAGNOSIS — M542 Cervicalgia: Secondary | ICD-10-CM

## 2016-01-13 HISTORY — PX: OTHER SURGICAL HISTORY: SHX169

## 2016-01-13 MED ORDER — IOPAMIDOL (ISOVUE-M 300) INJECTION 61%
10.0000 mL | Freq: Once | INTRAMUSCULAR | Status: AC | PRN
  Administered 2016-01-13: 10 mL via INTRATHECAL

## 2016-01-13 MED ORDER — DIAZEPAM 5 MG PO TABS
5.0000 mg | ORAL_TABLET | Freq: Once | ORAL | Status: AC
  Administered 2016-01-13: 5 mg via ORAL

## 2016-01-13 MED ORDER — ONDANSETRON HCL 4 MG/2ML IJ SOLN
4.0000 mg | Freq: Once | INTRAMUSCULAR | Status: AC
  Administered 2016-01-13: 4 mg via INTRAMUSCULAR

## 2016-01-13 MED ORDER — MEPERIDINE HCL 100 MG/ML IJ SOLN
75.0000 mg | Freq: Once | INTRAMUSCULAR | Status: AC
  Administered 2016-01-13: 75 mg via INTRAMUSCULAR

## 2016-01-13 NOTE — Discharge Instructions (Signed)

## 2016-01-15 ENCOUNTER — Telehealth: Payer: Self-pay

## 2016-01-15 DIAGNOSIS — M542 Cervicalgia: Secondary | ICD-10-CM | POA: Diagnosis not present

## 2016-01-15 DIAGNOSIS — I10 Essential (primary) hypertension: Secondary | ICD-10-CM | POA: Diagnosis not present

## 2016-01-15 NOTE — Telephone Encounter (Signed)
Spoke with patient's wife to see how Robert Zhang is doing after his myelogram here 01/13/16.  She said he was "a good patient" and stayed in bed even longer than we requested.  He has no headache and currently is out mowing the yard.  jkl

## 2016-01-16 DIAGNOSIS — E119 Type 2 diabetes mellitus without complications: Secondary | ICD-10-CM | POA: Diagnosis not present

## 2016-01-16 DIAGNOSIS — Z961 Presence of intraocular lens: Secondary | ICD-10-CM | POA: Diagnosis not present

## 2016-01-22 ENCOUNTER — Ambulatory Visit (INDEPENDENT_AMBULATORY_CARE_PROVIDER_SITE_OTHER): Payer: Medicare Other | Admitting: Orthopedic Surgery

## 2016-01-22 ENCOUNTER — Encounter: Payer: Self-pay | Admitting: Orthopedic Surgery

## 2016-01-22 VITALS — BP 196/113 | HR 85 | Ht 67.0 in | Wt 171.0 lb

## 2016-01-22 DIAGNOSIS — M75101 Unspecified rotator cuff tear or rupture of right shoulder, not specified as traumatic: Secondary | ICD-10-CM | POA: Diagnosis not present

## 2016-01-22 NOTE — Patient Instructions (Signed)
[place] [facility] DE[facility] TO SCHEDULE THERAPY VISITS [phone]

## 2016-01-22 NOTE — Progress Notes (Signed)
Chief Complaint  Patient presents with  . Shoulder Injury    RIGHT SHOULDER INJURY, 6-12 MONTHS OLD   HPI  ROS  Past Medical History:  Diagnosis Date  . Arthritis    bilateral legs /   Neck-Degenerative Disc Disease  . Carotid artery occlusion    Right Carotid   . Diabetes mellitus without complication ([place])   . Hypertension   . Leg cramps   . Sleep apnea    Stop Bang score of 5    Past Surgical History:  Procedure Laterality Date  . CATARACT EXTRACTION W/PHACO  07/20/2011   Procedure: CATARACT EXTRACTION PHACO AND INTRAOCULAR LENS PLACEMENT (IOC);  Surgeon: Williams Che, MD;  Location: [facility];  Service: Ophthalmology;  Laterality: Right;  CDE=25.73  . CATARACT EXTRACTION W/PHACO  08/31/2011   Procedure: CATARACT EXTRACTION PHACO AND INTRAOCULAR LENS PLACEMENT (IOC);  Surgeon: Williams Che, MD;  Location: [facility];  Service: Ophthalmology;  Laterality: Left;  CDE: 38.59  . COLONOSCOPY W/ POLYPECTOMY  2011   [facility]-Dr. Rehman  . head reconstruction  1966   due to head injury in war  . TRIGGER FINGER RELEASE     Family History  Problem Relation Age of Onset  . Anesthesia problems Neg Hx   . Hypotension Neg Hx   . Malignant hyperthermia Neg Hx   . Pseudochol deficiency Neg Hx   . Diabetes Mother   . Hypertension Father   . Heart disease Father     before age 9  . Heart attack Father    Social History  Substance Use Topics  . Smoking status: Former Smoker    Quit date: 05/18/1993  . Smokeless tobacco: Never Used  . Alcohol use 2.4 - 3.6 oz/week    4 - 6 Shots of liquor per week     Comment: 2-3 mixed drinks on a saturday night   Current Meds  Medication Sig  . aspirin 81 MG tablet Take 81 mg by mouth daily.  . Cholecalciferol (VITAMIN D3) 5000 UNITS CAPS Take 1 capsule by mouth daily.  Javier Docker Oil 300 MG CAPS Take 1 capsule by mouth daily.  Marland Kitchen olmesartan-hydrochlorothiazide (BENICAR HCT) 20-12.5 MG tablet Take 1 tablet by mouth daily.  . Omega-3  Fatty Acids (OMEGA 3 PO) Take by mouth.  . potassium chloride SA (K-DUR,KLOR-CON) 20 MEQ tablet Take 20 mEq by mouth 2 (two) times daily as needed.     BP (!) 196/113   Pulse 85   Ht 5\' 7"  (1.702 m)   Wt 171 lb (77.6 kg)   BMI 26.78 kg/m   Physical Exam  Constitutional: He is oriented to person, place, and time. He appears well-developed and well-nourished. No distress.  Cardiovascular: Normal rate and intact distal pulses.   Neurological: He is alert and oriented to person, place, and time.  Skin: Skin is warm and dry. No rash noted. He is not diaphoretic. No erythema. No pallor.  Psychiatric: He has a normal mood and affect. His behavior is normal. Judgment and thought content normal.    Ortho Exam No weakness in the supraspinatus tendon on either side. Passive range of motion is normal with a painful Neer impingement sign no instability. Skin normal axillary lymph nodes normal pulses normal sensation is normal in the right arm  Left shoulder normal range of motion strength and stability  ASSESSMENT: My personal interpretation of the images:  MRI of the shoulder shows a small high-grade partial thickness bursal side tear  6 weeks of physical therapy  Subacromial injection  Follow-up 6 weeks PLAN Arther Abbott, MD 01/22/2016 2:55 PM  .meds

## 2016-01-24 NOTE — Addendum Note (Signed)
Addended by: Baldomero Lamy B on: 01/24/2016 02:11 PM   Modules accepted: Orders

## 2016-01-27 ENCOUNTER — Ambulatory Visit (HOSPITAL_COMMUNITY): Payer: Medicare Other | Attending: Orthopedic Surgery

## 2016-01-27 ENCOUNTER — Encounter (HOSPITAL_COMMUNITY): Payer: Self-pay

## 2016-01-27 DIAGNOSIS — M25611 Stiffness of right shoulder, not elsewhere classified: Secondary | ICD-10-CM

## 2016-01-27 DIAGNOSIS — M25511 Pain in right shoulder: Secondary | ICD-10-CM | POA: Insufficient documentation

## 2016-01-27 NOTE — Patient Instructions (Addendum)
*  Complete these exercises 2-3 times.*  Wall Flexion  Using a towel, slide your arm up the wall until a stretch is felt in your shoulder . Hold for 10 seconds and complete twice.    (Home) Extension: Isometric / Bilateral Arm Retraction - Sitting   Facing anchor, hold hands and elbow at shoulder height, with elbow bent.  Pull arms back to squeeze shoulder blades together. Repeat 10-15 times.  Copyright  VHI. All rights reserved.   (Home) Retraction: Row - Bilateral (Anchor)   Facing anchor, arms reaching forward, pull hands toward stomach, keeping elbows bent and at your sides and pinching shoulder blades together. Repeat 10-15 times.  Copyright  V[employer]. All rights reserved.   (Clinic) Extension / Flexion (Assist)   Face anchor, pull arms back, keeping elbow straight, and squeze shoulder blades together. Repeat 10-15 times.   Copyright  VHI. All rights reserved.

## 2016-01-27 NOTE — Therapy (Signed)
[place], [place], [address] Phone: [phone]   Fax:  [phone]  Occupational Therapy Evaluation  Patient Details  Name: Robert Zhang MRN: [id number] Date of Birth: 11-18-1945 Referring Provider: Arther Abbott, MD  Encounter Date: 01/27/2016      OT End of Session - 01/27/16 1428    Visit Number 1   Number of Visits 6   Date for OT Re-Evaluation 03/09/16   Authorization Type [employer] medicare $40 co-pay   Authorization Time Period before 10th visit   Authorization - Visit Number 1   Authorization - Number of Visits 10   OT Start Time [id number]   OT Stop Time 1430   OT Time Calculation (min) 45 min   Activity Tolerance Patient tolerated treatment well   Behavior During Therapy [facility] for tasks assessed/performed      Past Medical History:  Diagnosis Date  . Arthritis    bilateral legs /   Neck-Degenerative Disc Disease  . Carotid artery occlusion    Right Carotid   . Diabetes mellitus without complication ([place])   . Hypertension   . Leg cramps   . Sleep apnea    Stop Bang score of 5    Past Surgical History:  Procedure Laterality Date  . CATARACT EXTRACTION W/PHACO  07/20/2011   Procedure: CATARACT EXTRACTION PHACO AND INTRAOCULAR LENS PLACEMENT (IOC);  Surgeon: Williams Che, MD;  Location: [facility];  Service: Ophthalmology;  Laterality: Right;  CDE=25.73  . CATARACT EXTRACTION W/PHACO  08/31/2011   Procedure: CATARACT EXTRACTION PHACO AND INTRAOCULAR LENS PLACEMENT (IOC);  Surgeon: Williams Che, MD;  Location: [facility];  Service: Ophthalmology;  Laterality: Left;  CDE: 38.59  . COLONOSCOPY W/ POLYPECTOMY  2011   [facility]-Dr. Rehman  . head reconstruction  1966   due to head injury in war  . TRIGGER FINGER RELEASE      There were no vitals filed for this visit.      Subjective Assessment - 01/27/16 1348    Subjective  S: I got a shot when i saw the doctor and it brought my pain down  significantly.    Pertinent History Patient is a 70 y/o male S/P right rotator cuff tear which occured 6-8 months ago from no known injury. patient received a shot from Dr. Aline Brochure with a significant decrease in pain level. patient reports that he mainly was having difficulty with pain and some strength. Dr. Aline Brochure has referred patient to occupational therapy for evaluation and treatment.    Special Tests FOTO score: 81/100    Patient Stated Goals To be able to maintain a low level of pain and increase functional use of RUE.    Currently in Pain? Yes   Pain Score 2    Pain Location Shoulder   Pain Orientation Right   Pain Descriptors / Indicators Aching;Throbbing   Pain Type Chronic pain   Pain Radiating Towards Towards neck and down arm   Pain Onset More than a month ago   Pain Frequency Occasional   Aggravating Factors  Movement   Pain Relieving Factors resting, cortisone shot, pain meds   Effect of Pain on Daily Activities Min effect   Multiple Pain Sites No           [facility] OT Assessment - 01/27/16 1345      Assessment   Diagnosis Right rotator cuff tear   Referring Provider Arther Abbott, MD   Onset Date --  6-8 months ago   Assessment 03/10/16   Prior Therapy None     Precautions   Precautions None     Restrictions   Weight Bearing Restrictions No     Balance Screen   Has the patient fallen in the past 6 months No     Home  Environment   Family/patient expects to be discharged to: Private residence     Prior Function   Level of Minor Retired   Leisure Pt has a 65 year old grandson. Enjoys dancing.      ADL   ADL comments Difficulty with strength when lifting heavy items.      Mobility   Mobility Status Independent     Written Expression   Dominant Hand Right     Vision - History   Baseline Vision No visual deficits     Cognition   Overall Cognitive Status Within Functional Limits for tasks assessed     ROM /  Strength   AROM / PROM / Strength AROM;PROM;Strength     Palpation   Palpation comment Max fascial restrictions in right upper arm and deltoid region.      AROM   Overall AROM Comments Assessed seated. IR/er adducted.   AROM Assessment Site Shoulder   Right/Left Shoulder Right   Right Shoulder Flexion 150 Degrees   Right Shoulder ABduction 126 Degrees   Right Shoulder Internal Rotation 90 Degrees   Right Shoulder External Rotation 35 Degrees     PROM   Overall PROM Comments Assessed supine. IR/er adducted   PROM Assessment Site Shoulder   Right/Left Shoulder Right   Right Shoulder Flexion 155 Degrees   Right Shoulder ABduction 180 Degrees   Right Shoulder Internal Rotation 90 Degrees   Right Shoulder External Rotation 90 Degrees     Strength   Overall Strength Comments Assessed seated. IR/er adducted.   Strength Assessment Site Shoulder   Right/Left Shoulder Right   Right Shoulder Flexion 5/5   Right Shoulder ABduction 5/5   Right Shoulder Internal Rotation 5/5   Right Shoulder External Rotation 3+/5                         OT Education - 01/27/16 1426    Education provided Yes   Education Details scapular theraband exercises (red) and shoulder flexion stretch.    Person(s) Educated Patient   Methods Explanation;Demonstration;Verbal cues;Handout;Tactile cues   Comprehension Returned demonstration;Verbalized understanding          OT Short Term Goals - 01/27/16 1437      OT SHORT TERM GOAL #1   Title Patient will be educated and independent with HEP to increase functional use of RUE during daily tasks.    Time 3   Period Weeks   Status New     OT SHORT TERM GOAL #2   Title Patient will decrease fascial restrictions to mod amount to increase functional mobility needed during reaching tasks.    Time 3   Period Weeks   Status New           OT Long Term Goals - 01/27/16 1438      OT LONG TERM GOAL #1   Title Patient will return to  highest level of independence with all daily tasks using RUE.    Time 6   Period Weeks   Status New     OT LONG TERM GOAL #2   Title Patient will increase A/ROM  to WNL with RUE during all functional reaching tasks requiring flexion and abduction.    Time 6   Period Weeks   Status New     OT LONG TERM GOAL #3   Title Patient will report a pain level that remains or is less than 2/10 to be able to participate in normal lifting tasks with more comfort using RUE.    Time 6   Period Weeks   Status New     OT LONG TERM GOAL #4   Title Patient will increase Shoulder strength during external rotation and scapular strength to increase ability to complete normal lifting tasks.    Time 6   Period Weeks   Status New     OT LONG TERM GOAL #5   Title Patient will decrease fascial restrictions to min amount or less to increase functional mobility and comfort in RUE.    Time 6   Period Weeks   Status New               Plan - 01/27/16 1430    Clinical Impression Statement A: Patient is a 70 y/o male S/P right rotator cuff tear causing increased pain and fascial restrictions and decreased strength and ROM resulting in difficulty completing daily tasks using RUE as dominant extremity.    Rehab Potential Excellent   OT Frequency 1x / week   OT Duration 6 weeks   OT Treatment/Interventions Self-care/ADL training;Therapeutic exercise;Patient/family education;Manual Therapy;Ultrasound;Cryotherapy;Electrical Stimulation;Moist Heat;Passive range of motion;Therapeutic activities   Plan P: Patient will benefit from skilled OT services to increase functional performance during ADl tasks using RUE as dominant. Treatment Plan: myofascial release, manual stretching, A/ROM, shoulder and scapular strengthening. Upgrade HEP weekly if needed as patient will be attending therapy 1x/week.    OT Home Exercise Plan 9/11: red scapular theraband exercises and shoulder flexion stretch   Consulted and Agree with  Plan of Care Patient      Patient will benefit from skilled therapeutic intervention in order to improve the following deficits and impairments:  Decreased strength, Decreased range of motion, Pain, Increased fascial restricitons  Visit Diagnosis: Pain in right shoulder - Plan: Ot plan of care cert/re-cert  Stiffness of right shoulder, not elsewhere classified - Plan: Ot plan of care cert/re-cert      G-Codes - [id number]    Functional Assessment Tool Used FOTO score: 81/100 (19% impaired)   Functional Limitation Carrying, moving and handling objects   Carrying, Moving and Handling Objects Current Status [id number]) At least 1 percent but less than 20 percent impaired, limited or restricted   Carrying, Moving and Handling Objects Goal Status [id number]) 0 percent impaired, limited or restricted      Problem List There are no active problems to display for this patient.  Ailene Ravel, OTR/L,CBIS  [phone]  01/27/2016, 3:07 PM  [facility] [address] [place], [place], [address] Phone: [phone]   Fax:  [phone]  Name: ODIES HAMMACK MRN: [id number] Date of Birth: 05/13/1946

## 2016-02-27 DIAGNOSIS — I10 Essential (primary) hypertension: Secondary | ICD-10-CM | POA: Diagnosis not present

## 2016-02-27 DIAGNOSIS — Z1389 Encounter for screening for other disorder: Secondary | ICD-10-CM | POA: Diagnosis not present

## 2016-02-27 DIAGNOSIS — I708 Atherosclerosis of other arteries: Secondary | ICD-10-CM | POA: Diagnosis not present

## 2016-02-27 DIAGNOSIS — Z0001 Encounter for general adult medical examination with abnormal findings: Secondary | ICD-10-CM | POA: Diagnosis not present

## 2016-02-27 DIAGNOSIS — M5030 Other cervical disc degeneration, unspecified cervical region: Secondary | ICD-10-CM | POA: Diagnosis not present

## 2016-02-27 DIAGNOSIS — E782 Mixed hyperlipidemia: Secondary | ICD-10-CM | POA: Diagnosis not present

## 2016-02-27 DIAGNOSIS — J449 Chronic obstructive pulmonary disease, unspecified: Secondary | ICD-10-CM | POA: Diagnosis not present

## 2016-02-27 DIAGNOSIS — Z125 Encounter for screening for malignant neoplasm of prostate: Secondary | ICD-10-CM | POA: Diagnosis not present

## 2016-02-27 DIAGNOSIS — E119 Type 2 diabetes mellitus without complications: Secondary | ICD-10-CM | POA: Diagnosis not present

## 2016-03-10 ENCOUNTER — Ambulatory Visit: Payer: Medicare Other | Admitting: Orthopedic Surgery

## 2016-03-10 DIAGNOSIS — Z23 Encounter for immunization: Secondary | ICD-10-CM | POA: Diagnosis not present

## 2016-04-06 ENCOUNTER — Encounter (HOSPITAL_COMMUNITY): Payer: Medicare Other

## 2016-04-06 ENCOUNTER — Ambulatory Visit: Payer: Medicare Other | Admitting: Surgery

## 2016-04-22 ENCOUNTER — Encounter: Payer: Self-pay | Admitting: Surgery

## 2016-04-27 ENCOUNTER — Ambulatory Visit: Payer: Medicare Other | Admitting: Surgery

## 2016-04-27 ENCOUNTER — Encounter (HOSPITAL_COMMUNITY): Payer: Medicare Other

## 2016-05-15 ENCOUNTER — Other Ambulatory Visit: Payer: Self-pay | Admitting: Urology

## 2016-05-15 ENCOUNTER — Ambulatory Visit (INDEPENDENT_AMBULATORY_CARE_PROVIDER_SITE_OTHER): Payer: Medicare Other | Admitting: Urology

## 2016-05-15 DIAGNOSIS — R351 Nocturia: Secondary | ICD-10-CM

## 2016-05-15 DIAGNOSIS — R972 Elevated prostate specific antigen [PSA]: Secondary | ICD-10-CM

## 2016-05-15 DIAGNOSIS — N401 Enlarged prostate with lower urinary tract symptoms: Secondary | ICD-10-CM

## 2016-05-20 ENCOUNTER — Encounter (HOSPITAL_COMMUNITY): Payer: Self-pay

## 2016-05-20 ENCOUNTER — Ambulatory Visit (HOSPITAL_COMMUNITY)
Admission: RE | Admit: 2016-05-20 | Discharge: 2016-05-20 | Disposition: A | Discharge status: Home / Self Care | Payer: Medicare Other | Source: Ambulatory Visit | Attending: Urology | Admitting: Urology

## 2016-05-20 DIAGNOSIS — R972 Elevated prostate specific antigen [PSA]: Secondary | ICD-10-CM | POA: Insufficient documentation

## 2016-05-20 DIAGNOSIS — C61 Malignant neoplasm of prostate: Secondary | ICD-10-CM | POA: Diagnosis not present

## 2016-05-20 HISTORY — PX: PROSTATE BIOPSY: SHX241

## 2016-05-20 MED ORDER — LIDOCAINE HCL (PF) 2 % IJ SOLN
INTRAMUSCULAR | Status: AC
  Administered 2016-05-20: 10 mL
  Filled 2016-05-20: qty 10

## 2016-05-20 MED ORDER — GENTAMICIN SULFATE 40 MG/ML IJ SOLN
INTRAMUSCULAR | Status: AC
  Administered 2016-05-20: 160 mg via INTRAMUSCULAR
  Filled 2016-05-20: qty 4

## 2016-05-20 MED ORDER — GENTAMICIN SULFATE 40 MG/ML IJ SOLN
160.0000 mg | Freq: Once | INTRAMUSCULAR | Status: AC
  Administered 2016-05-20: 160 mg via INTRAMUSCULAR

## 2016-05-20 NOTE — Therapy (Signed)
[place], [place], [address] Phone: [phone]   Fax:  [phone]  Patient Details  Name: Robert Zhang MRN: [id number] Date of Birth: 21-Jan-1946 Referring Provider:  No ref. provider found  Encounter Date: 05/20/2016 OCCUPATIONAL THERAPY DISCHARGE SUMMARY  Visits from Start of Care: 1  Current functional level related to goals / functional outcomes: Patient did not return after initial OT evaluation. No goals met.   Remaining deficits: All deficits remain as patient has not returned since initial evaluation.   Education / Equipment: Scapular theraband strengthening with red band, shoulder flexion stretch Plan: Patient agrees to discharge.  Patient goals were not met. Patient is being discharged due to not returning since the last visit.  ?????         Ailene Ravel, OTR/L,CBIS  [phone]   05/20/2016, 10:37 AM  [facility] [address] [place], [place], [address] Phone: [phone]   Fax:  [phone]

## 2016-06-10 ENCOUNTER — Ambulatory Visit (INDEPENDENT_AMBULATORY_CARE_PROVIDER_SITE_OTHER): Payer: Medicare Other | Admitting: Urology

## 2016-06-10 DIAGNOSIS — C61 Malignant neoplasm of prostate: Secondary | ICD-10-CM

## 2016-06-16 ENCOUNTER — Encounter (INDEPENDENT_AMBULATORY_CARE_PROVIDER_SITE_OTHER): Payer: Self-pay

## 2016-06-16 ENCOUNTER — Encounter (INDEPENDENT_AMBULATORY_CARE_PROVIDER_SITE_OTHER): Payer: Self-pay | Admitting: *Deleted

## 2016-06-23 ENCOUNTER — Encounter: Payer: Self-pay | Admitting: Surgery

## 2016-06-29 ENCOUNTER — Ambulatory Visit (HOSPITAL_COMMUNITY)
Admission: RE | Admit: 2016-06-29 | Discharge: 2016-06-29 | Disposition: A | Discharge status: Home / Self Care | Payer: Medicare Other | Source: Ambulatory Visit | Attending: Surgery | Admitting: Surgery

## 2016-06-29 ENCOUNTER — Encounter: Payer: Self-pay | Admitting: Surgery

## 2016-06-29 ENCOUNTER — Ambulatory Visit (INDEPENDENT_AMBULATORY_CARE_PROVIDER_SITE_OTHER): Payer: Medicare Other | Admitting: Surgery

## 2016-06-29 VITALS — BP 173/95 | HR 82 | Temp 97.7°F | Resp 20 | Ht 68.0 in | Wt 171.5 lb

## 2016-06-29 DIAGNOSIS — I6523 Occlusion and stenosis of bilateral carotid arteries: Secondary | ICD-10-CM | POA: Insufficient documentation

## 2016-06-29 DIAGNOSIS — I6521 Occlusion and stenosis of right carotid artery: Secondary | ICD-10-CM | POA: Diagnosis not present

## 2016-06-29 NOTE — Progress Notes (Signed)
[facility]  Patient name: Robert Zhang MRN: [id number] DOB: [birth year] Sex: male   REASON FOR VISIT:   Follow up carotid  HISOTRY OF PRESENT ILLNESS:    Robert Zhang is a 71 y.o. male returns today for follow-up.  In 2016 he had an episode where his left lip went numb.  This lasted about a minute.  He had a second event were his left lip and left foot went numb, this also lasting approximately 1 minute.  He has not had any additional symptoms.  A carotid Doppler study showed 50-69% stenosis.  He was sent for a CT scan for further evaluation.  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  There was only mild extracranial carotid disease.  Medical management was recommended.  The patient has not had any interval medical problems with the exception of being diagnosed with a low-grade prostate cancer.  He has not had any neurologic symptoms.   The patient suffers from diabetes. His most recent hemoglobin A1c was 6.3. He suffers from hypercholesterolemia. He is not on a statin as he initially declined using a statin and focused on diet and exercise. His most recent LDL cholesterol was 83. He suffers from PTSD as well as hepatitis C.  PAST MEDICAL HISTORY:   Past Medical History:  Diagnosis Date  . Arthritis    bilateral legs /   Neck-Degenerative Disc Disease  . Carotid artery occlusion    Right Carotid   . Diabetes mellitus without complication ([place])   . Hypertension   . Leg cramps   . Sleep apnea    Stop Bang score of 5     FAMILY HISTORY:   Family History  Problem Relation Age of Onset  . Diabetes Mother   . Hypertension Father   . Heart disease Father     before age 39  . Heart attack Father   . Anesthesia problems Neg Hx   . Hypotension Neg Hx   . Malignant hyperthermia Neg Hx   . Pseudochol deficiency Neg Hx     SOCIAL  HISTORY:   Social History  Substance Use Topics  . Smoking status: Former Smoker    Quit date: 05/18/1993  . Smokeless tobacco: Never Used  . Alcohol use 2.4 - 3.6 oz/week    4 - 6 Shots of liquor per week     Comment: 2-3 mixed drinks on a saturday night     ALLERGIES:   Allergies  Allergen Reactions  . Penicillins Rash and Other (See Comments)    Childhood allergy     CURRENT MEDICATIONS:   Current Outpatient Prescriptions  Medication Sig Dispense Refill  . aspirin 81 MG tablet Take 81 mg by mouth daily.    . Cholecalciferol (VITAMIN D3) 5000 UNITS CAPS Take 1 capsule by mouth daily.    . naproxen sodium (ANAPROX) 220 MG tablet Take 220 mg by mouth 2 (two) times daily as needed. For pain    . olmesartan-hydrochlorothiazide (BENICAR HCT) 20-12.5 MG tablet Take 1 tablet by mouth daily.    . potassium chloride SA (K-DUR,KLOR-CON) 20 MEQ tablet Take 20 mEq by mouth 2 (two) times daily as needed.     Javier Docker Oil 300 MG CAPS Take 1 capsule by mouth daily.    . Omega-3 Fatty Acids (OMEGA 3 PO) Take by mouth.     No current facility-administered medications for this visit.  REVIEW OF SYSTEMS:   [X]  denotes positive finding, [ ]  denotes negative finding Cardiac  Comments:  Chest pain or chest pressure:    Shortness of breath upon exertion:    Short of breath when lying flat:    Irregular heart rhythm:        Vascular    Pain in calf, thigh, or hip brought on by ambulation: x   Pain in feet at night that wakes you up from your sleep:     Blood clot in your veins:    Leg swelling:         Pulmonary    Oxygen at home:    Productive cough:     Wheezing:         Neurologic    Sudden weakness in arms or legs:     Sudden numbness in arms or legs:     Sudden onset of difficulty speaking or slurred speech:    Temporary loss of vision in one eye:     Problems with dizziness:         Gastrointestinal    Blood in stool:     Vomited blood:         Genitourinary      Burning when urinating:     Blood in urine:        Psychiatric    Major depression:         Hematologic    Bleeding problems:    Problems with blood clotting too easily:        Skin    Rashes or ulcers:        Constitutional    Fever or chills:      PHYSICAL EXAM:   Vitals:   06/29/16 1424 06/29/16 1426  BP: (!) 181/98 (!) 173/95  Pulse: 82   Resp: 20   Temp: 97.7 F (36.5 C)   TempSrc: Oral   SpO2: 98%   Weight: 171 lb 8 oz (77.8 kg)   Height: 5\' 8"  (1.727 m)     GENERAL: The patient is a well-nourished male, in no acute distress. The vital signs are documented above. CARDIAC: There is a regular rate and rhythm.  VASCULAR:  No Carotid bruit PULMONARY: Non-labored respirations MUSCULOSKELETAL: There are no major deformities or cyanosis. NEUROLOGIC: No focal weakness or paresthesias are detected. SKIN: There are no ulcers or rashes noted. PSYCHIATRIC: The patient has a normal affect.  STUDIES:   I reviewed his carotid duplex today this shows less than 40% bilateral carotid stenosis.  There is less than 50% right common carotid artery disease.  MEDICAL ISSUES:   History of left brain stroke.  I'm following him for extracranial carotid disease.  There is no significant disease identified by duplex today.  He remains asymptomatic.  He will continue with his current medical regimen with plans for follow-up in 1 year with a repeat carotid duplex.   Annamarie Major, MD [facility] [phone] Pager [phone]

## 2016-07-03 ENCOUNTER — Other Ambulatory Visit (INDEPENDENT_AMBULATORY_CARE_PROVIDER_SITE_OTHER): Payer: Self-pay | Admitting: *Deleted

## 2016-07-03 DIAGNOSIS — Z860100 Personal history of colon polyps, unspecified: Secondary | ICD-10-CM | POA: Insufficient documentation

## 2016-07-08 NOTE — Addendum Note (Signed)
Addended by: Lianne Cure A on: 07/08/2016 04:08 PM   Modules accepted: Orders

## 2016-07-17 ENCOUNTER — Encounter (INDEPENDENT_AMBULATORY_CARE_PROVIDER_SITE_OTHER): Payer: Self-pay | Admitting: *Deleted

## 2016-07-17 ENCOUNTER — Telehealth (INDEPENDENT_AMBULATORY_CARE_PROVIDER_SITE_OTHER): Payer: Self-pay | Admitting: *Deleted

## 2016-07-17 NOTE — Telephone Encounter (Signed)
Patient needs trilyte 

## 2016-07-20 MED ORDER — PEG 3350-KCL-NA BICARB-NACL 420 G PO SOLR: 4000.0000 mL | Freq: Once | ORAL | 0 refills | Status: AC

## 2016-07-22 ENCOUNTER — Telehealth (INDEPENDENT_AMBULATORY_CARE_PROVIDER_SITE_OTHER): Payer: Self-pay | Admitting: *Deleted

## 2016-07-22 NOTE — Telephone Encounter (Signed)
Patient is sch'd for repeat TCS 09/10/16 -- he cannot drink solution and wants pills however he is diabetic but is not on any medications just diet control -- can he take the pill prep -- please advise

## 2016-07-23 NOTE — Telephone Encounter (Signed)
Just to clarify, is it ok for patient to do the Osmo Pill prep even though he is diabetic--

## 2016-07-23 NOTE — Telephone Encounter (Signed)
What prep he wants to take.

## 2016-07-25 NOTE — Telephone Encounter (Signed)
I am okay with patient using loss MoviPrep as long as he understands risk or potential of kidney damage.

## 2016-07-28 ENCOUNTER — Telehealth (INDEPENDENT_AMBULATORY_CARE_PROVIDER_SITE_OTHER): Payer: Self-pay | Admitting: *Deleted

## 2016-07-28 ENCOUNTER — Encounter (INDEPENDENT_AMBULATORY_CARE_PROVIDER_SITE_OTHER): Payer: Self-pay | Admitting: *Deleted

## 2016-07-28 MED ORDER — SOD PHOS MONO-SOD PHOS DIBASIC 1.102-0.398 G PO TABS: 1.0000 | ORAL_TABLET | Freq: Once | ORAL | 0 refills | Status: AC

## 2016-07-28 NOTE — Telephone Encounter (Signed)
RX for osmo pill sent to pharmacy, instruction sheet mailed to patient

## 2016-07-28 NOTE — Telephone Encounter (Signed)
Patient needs osmo pill 

## 2016-08-11 DIAGNOSIS — M1000 Idiopathic gout, unspecified site: Secondary | ICD-10-CM | POA: Diagnosis not present

## 2016-08-11 DIAGNOSIS — C61 Malignant neoplasm of prostate: Secondary | ICD-10-CM | POA: Diagnosis not present

## 2016-08-11 DIAGNOSIS — M25561 Pain in right knee: Secondary | ICD-10-CM | POA: Diagnosis not present

## 2016-08-11 DIAGNOSIS — Z1389 Encounter for screening for other disorder: Secondary | ICD-10-CM | POA: Diagnosis not present

## 2016-08-13 ENCOUNTER — Telehealth (INDEPENDENT_AMBULATORY_CARE_PROVIDER_SITE_OTHER): Payer: Self-pay | Admitting: *Deleted

## 2016-08-13 NOTE — Telephone Encounter (Signed)
Referring MD/PCP: golding   Procedure: tcs  Reason/Indication:  Hx polyps  Has patient had this procedure before?  Yes, 2011  If so, when, by whom and where?    Is there a family history of colon cancer?  no  Who?  What age when diagnosed?    Is patient diabetic?   yes      Does patient have prosthetic heart valve or mechanical valve?  no  Do you have a pacemaker?  no  Has patient ever had endocarditis? no  Has patient had joint replacement within last 12 months?  no  Does patient tend to be constipated or take laxatives? no  Does patient have a history of alcohol/drug use?  no  Is patient on Coumadin, Plavix and/or Aspirin? yes  Medications: asa 81 mg daily, naproxen 220 mg daily, benicar/hct 20/12.5 mg daily, potassium 20 mg prn, vit d 5000 iu daily  Allergies: pcn  Medication Adjustment per Dr Laural Golden: asa & naproxen 2 days   Procedure date & time: 09/10/16 at 1200

## 2016-08-18 NOTE — Telephone Encounter (Signed)
agree

## 2016-09-07 DIAGNOSIS — E119 Type 2 diabetes mellitus without complications: Secondary | ICD-10-CM | POA: Diagnosis not present

## 2016-09-09 ENCOUNTER — Encounter (INDEPENDENT_AMBULATORY_CARE_PROVIDER_SITE_OTHER): Payer: Self-pay | Admitting: *Deleted

## 2016-09-09 NOTE — Telephone Encounter (Signed)
This encounter was created in error - please disregard.

## 2016-09-10 ENCOUNTER — Encounter (HOSPITAL_COMMUNITY)
Admission: RE | Disposition: A | Discharge status: Home / Self Care | Payer: Self-pay | Source: Ambulatory Visit | Attending: Internal Medicine

## 2016-09-10 ENCOUNTER — Encounter (HOSPITAL_COMMUNITY): Payer: Self-pay | Admitting: *Deleted

## 2016-09-10 ENCOUNTER — Ambulatory Visit (HOSPITAL_COMMUNITY)
Admission: RE | Admit: 2016-09-10 | Discharge: 2016-09-10 | Disposition: A | Discharge status: Home / Self Care | Payer: Medicare Other | Source: Ambulatory Visit | Attending: Internal Medicine | Admitting: Internal Medicine

## 2016-09-10 DIAGNOSIS — Z8546 Personal history of malignant neoplasm of prostate: Secondary | ICD-10-CM | POA: Insufficient documentation

## 2016-09-10 DIAGNOSIS — D123 Benign neoplasm of transverse colon: Secondary | ICD-10-CM | POA: Diagnosis not present

## 2016-09-10 DIAGNOSIS — E119 Type 2 diabetes mellitus without complications: Secondary | ICD-10-CM | POA: Insufficient documentation

## 2016-09-10 DIAGNOSIS — Z880 Allergy status to penicillin: Secondary | ICD-10-CM | POA: Diagnosis not present

## 2016-09-10 DIAGNOSIS — D125 Benign neoplasm of sigmoid colon: Secondary | ICD-10-CM | POA: Insufficient documentation

## 2016-09-10 DIAGNOSIS — I10 Essential (primary) hypertension: Secondary | ICD-10-CM | POA: Diagnosis not present

## 2016-09-10 DIAGNOSIS — Z87891 Personal history of nicotine dependence: Secondary | ICD-10-CM | POA: Insufficient documentation

## 2016-09-10 DIAGNOSIS — Z09 Encounter for follow-up examination after completed treatment for conditions other than malignant neoplasm: Secondary | ICD-10-CM | POA: Diagnosis not present

## 2016-09-10 DIAGNOSIS — K644 Residual hemorrhoidal skin tags: Secondary | ICD-10-CM | POA: Insufficient documentation

## 2016-09-10 DIAGNOSIS — Z7982 Long term (current) use of aspirin: Secondary | ICD-10-CM | POA: Insufficient documentation

## 2016-09-10 DIAGNOSIS — Z86010 Personal history of colonic polyps: Secondary | ICD-10-CM | POA: Insufficient documentation

## 2016-09-10 DIAGNOSIS — G4730 Sleep apnea, unspecified: Secondary | ICD-10-CM | POA: Insufficient documentation

## 2016-09-10 DIAGNOSIS — Z1211 Encounter for screening for malignant neoplasm of colon: Secondary | ICD-10-CM | POA: Insufficient documentation

## 2016-09-10 DIAGNOSIS — M1990 Unspecified osteoarthritis, unspecified site: Secondary | ICD-10-CM | POA: Insufficient documentation

## 2016-09-10 HISTORY — DX: Malignant neoplasm of prostate: C61

## 2016-09-10 HISTORY — PX: COLONOSCOPY: SHX5424

## 2016-09-10 SURGERY — COLONOSCOPY
Anesthesia: Moderate Sedation

## 2016-09-10 MED ORDER — SODIUM CHLORIDE 0.9 % IV SOLN
INTRAVENOUS | Status: DC
  Administered 2016-09-10: 11:00:00 via INTRAVENOUS

## 2016-09-10 MED ORDER — MEPERIDINE HCL 50 MG/ML IJ SOLN
INTRAMUSCULAR | Status: AC
  Filled 2016-09-10: qty 1

## 2016-09-10 MED ORDER — MIDAZOLAM HCL 5 MG/5ML IJ SOLN
INTRAMUSCULAR | Status: DC | PRN
  Administered 2016-09-10 (×3): 2 mg via INTRAVENOUS

## 2016-09-10 MED ORDER — MEPERIDINE HCL 50 MG/ML IJ SOLN
INTRAMUSCULAR | Status: DC | PRN
  Administered 2016-09-10 (×2): 25 mg via INTRAVENOUS

## 2016-09-10 MED ORDER — STERILE WATER FOR IRRIGATION IR SOLN
Status: DC | PRN
  Administered 2016-09-10: 2.5 mL

## 2016-09-10 MED ORDER — MIDAZOLAM HCL 5 MG/5ML IJ SOLN
INTRAMUSCULAR | Status: AC
  Filled 2016-09-10: qty 10

## 2016-09-10 NOTE — H&P (Signed)
Robert Zhang is an 71 y.o. male.   Chief Complaint: Patient is here for colonoscopy. HPI: Patient is 71 year old Caucasian male with for small polyps removed 7 years ago and one was tubular adenoma. He was advised to return for surveillance exam in 7 years. He denies abdominal pain change in bowel habits or rectal bleeding. He was recently diagnosed with prostate carcinoma and Gleason score was low. He is being monitored. Family history is negative for CRC.  Past Medical History:  Diagnosis Date  . Arthritis    bilateral legs /   Neck-Degenerative Disc Disease  . Carotid artery occlusion    Right Carotid   . Diabetes mellitus without complication ([place])   . Hypertension   . Leg cramps   . Prostate CA ([place])   . Sleep apnea    Stop Bang score of 5    Past Surgical History:  Procedure Laterality Date  . CATARACT EXTRACTION W/PHACO  07/20/2011   Procedure: CATARACT EXTRACTION PHACO AND INTRAOCULAR LENS PLACEMENT (IOC);  Surgeon: Williams Che, MD;  Location: [facility];  Service: Ophthalmology;  Laterality: Right;  CDE=25.73  . CATARACT EXTRACTION W/PHACO  08/31/2011   Procedure: CATARACT EXTRACTION PHACO AND INTRAOCULAR LENS PLACEMENT (IOC);  Surgeon: Williams Che, MD;  Location: [facility];  Service: Ophthalmology;  Laterality: Left;  CDE: 38.59  . COLONOSCOPY W/ POLYPECTOMY  2011   [facility]-Dr. Rehman  . head reconstruction  1966   due to head injury in war  . TRIGGER FINGER RELEASE      Family History  Problem Relation Age of Onset  . Diabetes Mother   . Hypertension Father   . Heart disease Father     before age 28  . Heart attack Father   . Anesthesia problems Neg Hx   . Hypotension Neg Hx   . Malignant hyperthermia Neg Hx   . Pseudochol deficiency Neg Hx    Social History:  reports that he quit smoking about 23 years ago. He has never used smokeless tobacco. He reports that he drinks about 2.4 - 3.6 oz of alcohol per week . He reports that he does not use  drugs.  Allergies:  Allergies  Allergen Reactions  . Penicillins Rash and Other (See Comments)    Childhood allergy Has patient had a PCN reaction causing immediate rash, facial/tongue/throat swelling, SOB or lightheadedness with hypotension: yes Has patient had a PCN reaction causing severe rash involving mucus membranes or skin necrosis: no Has patient had a PCN reaction that required hospitalization no Has patient had a PCN reaction occurring within the last 10 years: no If all of the above answers are "NO", then may proceed with Cephalosporin use.      Medications Prior to Admission  Medication Sig Dispense Refill  . aspirin 81 MG tablet Take 81 mg by mouth daily.    . Cholecalciferol (VITAMIN D3) 5000 UNITS CAPS Take 1 capsule by mouth daily.    . naproxen (NAPROSYN) 500 MG tablet Take 250 mg by mouth 2 (two) times daily as needed for mild pain.    Marland Kitchen olmesartan-hydrochlorothiazide (BENICAR HCT) 20-12.5 MG tablet Take 1 tablet by mouth daily.    . potassium chloride SA (K-DUR,KLOR-CON) 20 MEQ tablet Take 20 mEq by mouth daily as needed (cramping).     . vitamin C (ASCORBIC ACID) 500 MG tablet Take 500 mg by mouth every evening.      No results found for this or any previous visit (from the past  48 hour(s)). No results found.  ROS  Blood pressure (!) 156/131, pulse 90, temperature 97.8 F (36.6 C), temperature source Oral, resp. rate 19, height 5\' 8"  (1.727 m), weight 164 lb (74.4 kg), SpO2 100 %. Physical Exam  Constitutional: He appears well-developed and well-nourished.  HENT:  Mouth/Throat: Oropharynx is clear and moist.  Eyes: Conjunctivae are normal. No scleral icterus.  Neck: No thyromegaly present.  Cardiovascular: Normal rate, regular rhythm and normal heart sounds.   No murmur heard. Respiratory: Effort normal and breath sounds normal.  GI: Soft. He exhibits no distension and no mass. There is no tenderness.  Musculoskeletal: He exhibits no edema.   Lymphadenopathy:    He has no cervical adenopathy.  Neurological: He is alert.  Skin: Skin is warm and dry.     Assessment/Plan History of colonic adenoma. Surveillance colonoscopy.  Hildred Laser, MD 09/10/2016, 11:19 AM

## 2016-09-10 NOTE — Discharge Instructions (Signed)
Resume aspirin on 09/11/2016. Resume other medications and diet as before. No driving for 24 hours. Physician will call with biopsy results.    Colonoscopy, Adult, Care After This sheet gives you information about how to care for yourself after your procedure. Your doctor may also give you more specific instructions. If you have problems or questions, call your doctor. Follow these instructions at home: General instructions    For the first 24 hours after the procedure:  Do not drive or use machinery.  Do not sign important documents.  Do not drink alcohol.  Do your daily activities more slowly than normal.  Eat foods that are soft and easy to digest.  Rest often.  Take over-the-counter or prescription medicines only as told by your doctor.  It is up to you to get the results of your procedure. Ask your doctor, or the department performing the procedure, when your results will be ready. To help cramping and bloating:   Try walking around.  Put heat on your belly (abdomen) as told by your doctor. Use a heat source that your doctor recommends, such as a moist heat pack or a heating pad.  Put a towel between your skin and the heat source.  Leave the heat on for 20-30 minutes.  Remove the heat if your skin turns bright red. This is especially important if you cannot feel pain, heat, or cold. You can get burned. Eating and drinking   Drink enough fluid to keep your pee (urine) clear or pale yellow.  Return to your normal diet as told by your doctor. Avoid heavy or fried foods that are hard to digest.  Avoid drinking alcohol for as long as told by your doctor. Contact a doctor if:  You have blood in your poop (stool) 2-3 days after the procedure. Get help right away if:  You have more than a small amount of blood in your poop.  You see large clumps of tissue (blood clots) in your poop.  Your belly is swollen.  You feel sick to your stomach (nauseous).  You throw  up (vomit).  You have a fever.  You have belly pain that gets worse, and medicine does not help your pain.    Colon Polyps Polyps are tissue growths inside the body. Polyps can grow in many places, including the large intestine (colon). A polyp may be a round bump or a mushroom-shaped growth. You could have one polyp or several. Most colon polyps are noncancerous (benign). However, some colon polyps can become cancerous over time. What are the causes? The exact cause of colon polyps is not known. What increases the risk? This condition is more likely to develop in people who:  Have a family history of colon cancer or colon polyps.  Are older than 64 or older than 45 if they are African American.  Have inflammatory bowel disease, such as ulcerative colitis or Crohn disease.  Are overweight.  Smoke cigarettes.  Do not get enough exercise.  Drink too much alcohol.  Eat a diet that is:  High in fat and red meat.  Low in fiber.  Had childhood cancer that was treated with abdominal radiation. What are the signs or symptoms? Most polyps do not cause symptoms. If you have symptoms, they may include:  Blood coming from your rectum when having a bowel movement.  Blood in your stool.The stool may look dark red or black.  A change in bowel habits, such as constipation or diarrhea. How is this  diagnosed? This condition is diagnosed with a colonoscopy. This is a procedure that uses a lighted, flexible scope to look at the inside of your colon. How is this treated? Treatment for this condition involves removing any polyps that are found. Those polyps will then be tested for cancer. If cancer is found, your health care provider will talk to you about options for colon cancer treatment. Follow these instructions at home: Diet   Eat plenty of fiber, such as fruits, vegetables, and whole grains.  Eat foods that are high in calcium and vitamin D, such as milk, cheese, yogurt, eggs,  liver, fish, and broccoli.  Limit foods high in fat, red meats, and processed meats, such as hot dogs, sausage, bacon, and lunch meats.  Maintain a healthy weight, or lose weight if recommended by your health care provider. General instructions   Do not smoke cigarettes.  Do not drink alcohol excessively.  Keep all follow-up visits as told by your health care provider. This is important. This includes keeping regularly scheduled colonoscopies. Talk to your health care provider about when you need a colonoscopy.  Exercise every day or as told by your health care provider. Contact a health care provider if:  You have new or worsening bleeding during a bowel movement.  You have new or increased blood in your stool.  You have a change in bowel habits.  You unexpectedly lose weight.    Hemorrhoids Hemorrhoids are swollen veins in and around the rectum or anus. There are two types of hemorrhoids:  Internal hemorrhoids. These occur in the veins that are just inside the rectum. They may poke through to the outside and become irritated and painful.  External hemorrhoids. These occur in the veins that are outside of the anus and can be felt as a painful swelling or hard lump near the anus. Most hemorrhoids do not cause serious problems, and they can be managed with home treatments such as diet and lifestyle changes. If home treatments do not help your symptoms, procedures can be done to shrink or remove the hemorrhoids. What are the causes? This condition is caused by increased pressure in the anal area. This pressure may result from various things, including:  Constipation.  Straining to have a bowel movement.  Diarrhea.  Pregnancy.  Obesity.  Sitting for long periods of time.  Heavy lifting or other activity that causes you to strain.  Anal sex. What are the signs or symptoms? Symptoms of this condition include:  Pain.  Anal itching or irritation.  Rectal  bleeding.  Leakage of stool (feces).  Anal swelling.  One or more lumps around the anus. How is this diagnosed? This condition can often be diagnosed through a visual exam. Other exams or tests may also be done, such as:  Examination of the rectal area with a gloved hand (digital rectal exam).  Examination of the anal canal using a small tube (anoscope).  A blood test, if you have lost a significant amount of blood.  A test to look inside the colon (sigmoidoscopy or colonoscopy). How is this treated? This condition can usually be treated at home. However, various procedures may be done if dietary changes, lifestyle changes, and other home treatments do not help your symptoms. These procedures can help make the hemorrhoids smaller or remove them completely. Some of these procedures involve surgery, and others do not. Common procedures include:  Rubber band ligation. Rubber bands are placed at the base of the hemorrhoids to cut off the  blood supply to them.  Sclerotherapy. Medicine is injected into the hemorrhoids to shrink them.  Infrared coagulation. A type of light energy is used to get rid of the hemorrhoids.  Hemorrhoidectomy surgery. The hemorrhoids are surgically removed, and the veins that supply them are tied off.  Stapled hemorrhoidopexy surgery. A circular stapling device is used to remove the hemorrhoids and use staples to cut off the blood supply to them. Follow these instructions at home: Eating and drinking   Eat foods that have a lot of fiber in them, such as whole grains, beans, nuts, fruits, and vegetables. Ask your health care provider about taking products that have added fiber (fiber supplements).  Drink enough fluid to keep your urine clear or pale yellow. Managing pain and swelling   Take warm sitz baths for 20 minutes, 3-4 times a day to ease pain and discomfort.  If directed, apply ice to the affected area. Using ice packs between sitz baths may be  helpful.  Put ice in a plastic bag.  Place a towel between your skin and the bag.  Leave the ice on for 20 minutes, 2-3 times a day. General instructions   Take over-the-counter and prescription medicines only as told by your health care provider.  Use medicated creams or suppositories as told.  Exercise regularly.  Go to the bathroom when you have the urge to have a bowel movement. Do not wait.  Avoid straining to have bowel movements.  Keep the anal area dry and clean. Use wet toilet paper or moist towelettes after a bowel movement.  Do not sit on the toilet for long periods of time. This increases blood pooling and pain. Contact a health care provider if:  You have increasing pain and swelling that are not controlled by treatment or medicine.  You have uncontrolled bleeding.  You have difficulty having a bowel movement, or you are unable to have a bowel movement.  You have pain or inflammation outside the area of the hemorrhoids. This information is not intended to replace advice given to you by your health care provider. Make sure you discuss any questions you have with your health care provider. Document Released: 05/01/2000 Document Revised: 10/02/2015 Document Reviewed: 01/16/2015 Elsevier Interactive Patient Education  2017 [employer].

## 2016-09-10 NOTE — Op Note (Signed)
[facility] Patient Name: Robert Zhang Procedure Date: 09/10/2016 11:02 AM MRN: [id number] Date of Birth: Feb 16, 1946 Attending MD: Hildred Laser , MD CSN: [id number] Age: 71 Admit Type: Outpatient Procedure:                Colonoscopy Indications:              High risk colon cancer surveillance: Personal                            history of colonic polyps Providers:                Hildred Laser, MD, Charlyne Petrin RN, RN, Aram Candela Referring MD:             Halford Chessman, MD Medicines:                Meperidine 50 mg IV, Midazolam 6 mg IV Complications:            No immediate complications. Estimated Blood Loss:     Estimated blood loss was minimal. Procedure:                Pre-Anesthesia Assessment:                           - Prior to the procedure, a History and Physical                            was performed, and patient medications and                            allergies were reviewed. The patient's tolerance of                            previous anesthesia was also reviewed. The risks                            and benefits of the procedure and the sedation                            options and risks were discussed with the patient.                            All questions were answered, and informed consent                            was obtained. Prior Anticoagulants: The patient                            last took aspirin 2 days prior to the procedure.                            ASA Grade Assessment: II - A patient with mild  systemic disease. After reviewing the risks and                            benefits, the patient was deemed in satisfactory                            condition to undergo the procedure.                           After obtaining informed consent, the colonoscope                            was passed under direct vision. Throughout the                            procedure, the  patient's blood pressure, pulse, and                            oxygen saturations were monitored continuously. The                            EC-[id number] ([id number]) scope was introduced through                            the anus and advanced to the the cecum, identified                            by appendiceal orifice and ileocecal valve. The                            colonoscopy was performed without difficulty. The                            patient tolerated the procedure well. The quality                            of the bowel preparation was adequate to identify                            polyps. The ileocecal valve, appendiceal orifice,                            and rectum were photographed. Scope In: 11:30:31 AM Scope Out: 11:55:06 AM Scope Withdrawal Time: 0 hours 21 minutes 35 seconds  Total Procedure Duration: 0 hours 24 minutes 35 seconds  Findings:      The perianal exam findings include a skin tag.      Two sessile polyps were found in the splenic flexure. The polyps were       small in size. These polyps were removed with a cold snare. Resection       was complete, but one polyp was not retrieved. The pathology specimen       was placed into Bottle Number 1.      Two sessile polyps were found in the sigmoid colon. The polyps were  diminutive in size. These were biopsied with a cold forceps for       histology. The pathology specimen was placed into Bottle Number 1.      External hemorrhoids were found during retroflexion. The hemorrhoids       were small.      Anal papilla(e) were hypertrophied. Impression:               - Perianal skin tag found on perianal exam.                           - Two small polyps at the splenic flexure, removed                            with a cold snare. Complete resection. Polyp tissue                            not retrieved.                           - Two diminutive polyps in the sigmoid colon.                             Biopsied.                           - External hemorrhoids.                           - Anal papilla(e) were hypertrophied.                           Commrnt: Four small polyps removed and one one was                            lost. Moderate Sedation:      Moderate (conscious) sedation was administered by the endoscopy nurse       and supervised by the endoscopist. The following parameters were       monitored: oxygen saturation, heart rate, blood pressure, CO2       capnography and response to care. Total physician intraservice time was       31 minutes. Recommendation:           - Patient has a contact number available for                            emergencies. The signs and symptoms of potential                            delayed complications were discussed with the                            patient. Return to normal activities tomorrow.                            Written discharge instructions were provided to the  patient.                           - Resume previous diet today.                           - Continue present medications.                           - Resume aspirin at prior dose tomorrow.                           - Await pathology results.                           - Repeat colonoscopy for surveillance based on                            pathology results. Procedure Code(s):        --- Professional ---                           [phone], Colonoscopy, flexible; with removal of                            tumor(s), polyp(s), or other lesion(s) by snare                            technique                           45380, 59, Colonoscopy, flexible; with biopsy,                            single or multiple                           99152, Moderate sedation services provided by the                            same physician or other qualified health care                            professional performing the diagnostic or                             therapeutic service that the sedation supports,                            requiring the presence of an independent trained                            observer to assist in the monitoring of the                            patient's level of consciousness and physiological  status; initial 15 minutes of intraservice time,                            patient age 109 years or older                           [phone], Moderate sedation services; each additional                            15 minutes intraservice time Diagnosis Code(s):        --- Professional ---                           K64.4, Residual hemorrhoidal skin tags                           Z86.010, Personal history of colonic polyps                           D12.3, Benign neoplasm of transverse colon (hepatic                            flexure or splenic flexure)                           D12.5, Benign neoplasm of sigmoid colon                           K62.89, Other specified diseases of anus and rectum CPT copyright 2016 American Medical Association. All rights reserved. The codes documented in this report are preliminary and upon coder review may  be revised to meet current compliance requirements. Hildred Laser, MD Hildred Laser, MD 09/10/2016 12:05:22 PM This report has been signed electronically. Number of Addenda: 0

## 2016-09-15 ENCOUNTER — Encounter (HOSPITAL_COMMUNITY): Payer: Self-pay | Admitting: Internal Medicine

## 2016-09-15 ENCOUNTER — Telehealth (INDEPENDENT_AMBULATORY_CARE_PROVIDER_SITE_OTHER): Payer: Self-pay | Admitting: Internal Medicine

## 2016-09-15 NOTE — Telephone Encounter (Signed)
Dr.Rehman has addressed with the patient.

## 2016-09-15 NOTE — Telephone Encounter (Signed)
Patient called, stated that he had a colonoscopy last week and that Dr. Laural Golden had to send a polyp off for a biopsy.  He is concerned and would like to know the results of the biopsy.  [phone]

## 2016-09-16 ENCOUNTER — Ambulatory Visit (INDEPENDENT_AMBULATORY_CARE_PROVIDER_SITE_OTHER): Payer: Medicare Other | Admitting: Urology

## 2016-09-16 DIAGNOSIS — N401 Enlarged prostate with lower urinary tract symptoms: Secondary | ICD-10-CM

## 2016-09-16 DIAGNOSIS — C61 Malignant neoplasm of prostate: Secondary | ICD-10-CM

## 2016-11-17 DIAGNOSIS — C61 Malignant neoplasm of prostate: Secondary | ICD-10-CM | POA: Diagnosis not present

## 2016-11-25 ENCOUNTER — Encounter (HOSPITAL_COMMUNITY): Payer: Self-pay

## 2016-11-25 ENCOUNTER — Other Ambulatory Visit: Payer: Self-pay | Admitting: Urology

## 2016-11-25 ENCOUNTER — Ambulatory Visit (INDEPENDENT_AMBULATORY_CARE_PROVIDER_SITE_OTHER): Payer: Medicare Other | Admitting: Urology

## 2016-11-25 ENCOUNTER — Encounter (HOSPITAL_COMMUNITY)
Admission: RE | Admit: 2016-11-25 | Discharge: 2016-11-25 | Disposition: A | Discharge status: Home / Self Care | Payer: Medicare Other | Source: Ambulatory Visit | Attending: Urology | Admitting: Urology

## 2016-11-25 DIAGNOSIS — R351 Nocturia: Secondary | ICD-10-CM | POA: Diagnosis not present

## 2016-11-25 DIAGNOSIS — N401 Enlarged prostate with lower urinary tract symptoms: Secondary | ICD-10-CM

## 2016-11-25 DIAGNOSIS — C61 Malignant neoplasm of prostate: Secondary | ICD-10-CM

## 2016-11-25 MED ORDER — TECHNETIUM TC 99M MEDRONATE IV KIT
20.0000 | PACK | Freq: Once | INTRAVENOUS | Status: AC | PRN
  Administered 2016-11-25: 19.5 via INTRAVENOUS

## 2016-11-26 ENCOUNTER — Other Ambulatory Visit (HOSPITAL_COMMUNITY): Payer: Medicare Other

## 2016-11-26 ENCOUNTER — Ambulatory Visit (HOSPITAL_COMMUNITY)
Admission: RE | Admit: 2016-11-26 | Discharge: 2016-11-26 | Disposition: A | Discharge status: Home / Self Care | Payer: Medicare Other | Source: Ambulatory Visit | Attending: Urology | Admitting: Urology

## 2016-11-26 DIAGNOSIS — R911 Solitary pulmonary nodule: Secondary | ICD-10-CM | POA: Diagnosis not present

## 2016-11-26 DIAGNOSIS — N400 Enlarged prostate without lower urinary tract symptoms: Secondary | ICD-10-CM | POA: Insufficient documentation

## 2016-11-26 DIAGNOSIS — R938 Abnormal findings on diagnostic imaging of other specified body structures: Secondary | ICD-10-CM | POA: Insufficient documentation

## 2016-11-26 DIAGNOSIS — C61 Malignant neoplasm of prostate: Secondary | ICD-10-CM | POA: Diagnosis not present

## 2016-11-26 DIAGNOSIS — I700 Atherosclerosis of aorta: Secondary | ICD-10-CM | POA: Insufficient documentation

## 2016-11-26 HISTORY — DX: Solitary pulmonary nodule: R91.1

## 2016-11-26 MED ORDER — IOPAMIDOL (ISOVUE-300) INJECTION 61%
100.0000 mL | Freq: Once | INTRAVENOUS | Status: AC | PRN
  Administered 2016-11-26: 100 mL via INTRAVENOUS

## 2017-02-11 DIAGNOSIS — R21 Rash and other nonspecific skin eruption: Secondary | ICD-10-CM | POA: Diagnosis not present

## 2017-02-18 DIAGNOSIS — Z1389 Encounter for screening for other disorder: Secondary | ICD-10-CM | POA: Diagnosis not present

## 2017-02-18 DIAGNOSIS — Z23 Encounter for immunization: Secondary | ICD-10-CM | POA: Diagnosis not present

## 2017-02-18 DIAGNOSIS — C61 Malignant neoplasm of prostate: Secondary | ICD-10-CM | POA: Diagnosis not present

## 2017-03-03 ENCOUNTER — Ambulatory Visit (INDEPENDENT_AMBULATORY_CARE_PROVIDER_SITE_OTHER): Payer: Medicare Other | Admitting: Urology

## 2017-03-03 DIAGNOSIS — N401 Enlarged prostate with lower urinary tract symptoms: Secondary | ICD-10-CM | POA: Diagnosis not present

## 2017-03-03 DIAGNOSIS — R351 Nocturia: Secondary | ICD-10-CM | POA: Diagnosis not present

## 2017-03-03 DIAGNOSIS — C61 Malignant neoplasm of prostate: Secondary | ICD-10-CM

## 2017-03-05 DIAGNOSIS — H33311 Horseshoe tear of retina without detachment, right eye: Secondary | ICD-10-CM | POA: Diagnosis not present

## 2017-03-05 DIAGNOSIS — H26492 Other secondary cataract, left eye: Secondary | ICD-10-CM | POA: Diagnosis not present

## 2017-03-05 DIAGNOSIS — Z961 Presence of intraocular lens: Secondary | ICD-10-CM | POA: Diagnosis not present

## 2017-03-05 DIAGNOSIS — H31091 Other chorioretinal scars, right eye: Secondary | ICD-10-CM | POA: Diagnosis not present

## 2017-03-05 DIAGNOSIS — E119 Type 2 diabetes mellitus without complications: Secondary | ICD-10-CM | POA: Diagnosis not present

## 2017-03-05 HISTORY — PX: REFRACTIVE SURGERY: SHX103

## 2017-03-10 DIAGNOSIS — X32XXXD Exposure to sunlight, subsequent encounter: Secondary | ICD-10-CM | POA: Diagnosis not present

## 2017-03-10 DIAGNOSIS — L570 Actinic keratosis: Secondary | ICD-10-CM | POA: Diagnosis not present

## 2017-03-10 DIAGNOSIS — D225 Melanocytic nevi of trunk: Secondary | ICD-10-CM | POA: Diagnosis not present

## 2017-03-10 DIAGNOSIS — L821 Other seborrheic keratosis: Secondary | ICD-10-CM | POA: Diagnosis not present

## 2017-03-11 DIAGNOSIS — I708 Atherosclerosis of other arteries: Secondary | ICD-10-CM | POA: Diagnosis not present

## 2017-03-11 DIAGNOSIS — Z125 Encounter for screening for malignant neoplasm of prostate: Secondary | ICD-10-CM | POA: Diagnosis not present

## 2017-03-11 DIAGNOSIS — Z86010 Personal history of colonic polyps: Secondary | ICD-10-CM | POA: Diagnosis not present

## 2017-03-11 DIAGNOSIS — E782 Mixed hyperlipidemia: Secondary | ICD-10-CM | POA: Diagnosis not present

## 2017-03-11 DIAGNOSIS — I10 Essential (primary) hypertension: Secondary | ICD-10-CM | POA: Diagnosis not present

## 2017-03-11 DIAGNOSIS — Z0000 Encounter for general adult medical examination without abnormal findings: Secondary | ICD-10-CM | POA: Diagnosis not present

## 2017-03-11 DIAGNOSIS — R7309 Other abnormal glucose: Secondary | ICD-10-CM | POA: Diagnosis not present

## 2017-05-31 DIAGNOSIS — C61 Malignant neoplasm of prostate: Secondary | ICD-10-CM | POA: Diagnosis not present

## 2017-05-31 DIAGNOSIS — R972 Elevated prostate specific antigen [PSA]: Secondary | ICD-10-CM | POA: Diagnosis not present

## 2017-05-31 DIAGNOSIS — Z0000 Encounter for general adult medical examination without abnormal findings: Secondary | ICD-10-CM | POA: Diagnosis not present

## 2017-05-31 DIAGNOSIS — E782 Mixed hyperlipidemia: Secondary | ICD-10-CM | POA: Diagnosis not present

## 2017-05-31 DIAGNOSIS — Z125 Encounter for screening for malignant neoplasm of prostate: Secondary | ICD-10-CM | POA: Diagnosis not present

## 2017-06-28 ENCOUNTER — Other Ambulatory Visit: Payer: Self-pay

## 2017-06-28 ENCOUNTER — Ambulatory Visit (HOSPITAL_COMMUNITY)
Admission: RE | Admit: 2017-06-28 | Discharge: 2017-06-28 | Disposition: A | Discharge status: Home / Self Care | Payer: Medicare Other | Source: Ambulatory Visit | Attending: Surgery | Admitting: Surgery

## 2017-06-28 ENCOUNTER — Ambulatory Visit (INDEPENDENT_AMBULATORY_CARE_PROVIDER_SITE_OTHER): Payer: Medicare Other | Admitting: Surgery

## 2017-06-28 ENCOUNTER — Encounter: Payer: Self-pay | Admitting: Surgery

## 2017-06-28 VITALS — BP 183/90 | HR 79 | Temp 98.7°F | Resp 18 | Ht 67.0 in | Wt 173.0 lb

## 2017-06-28 DIAGNOSIS — Z87891 Personal history of nicotine dependence: Secondary | ICD-10-CM | POA: Diagnosis not present

## 2017-06-28 DIAGNOSIS — I10 Essential (primary) hypertension: Secondary | ICD-10-CM | POA: Insufficient documentation

## 2017-06-28 DIAGNOSIS — Z794 Long term (current) use of insulin: Secondary | ICD-10-CM | POA: Diagnosis not present

## 2017-06-28 DIAGNOSIS — E119 Type 2 diabetes mellitus without complications: Secondary | ICD-10-CM | POA: Diagnosis not present

## 2017-06-28 DIAGNOSIS — I6521 Occlusion and stenosis of right carotid artery: Secondary | ICD-10-CM | POA: Diagnosis not present

## 2017-06-28 LAB — VAS US CAROTID
LCCADDIAS: -23 cm/s
LCCAPDIAS: 19 cm/s
LEFT ECA DIAS: -15 cm/s
LEFT VERTEBRAL DIAS: 12 cm/s
LICADSYS: -63 cm/s
LICAPDIAS: -20 cm/s
Left CCA dist sys: -119 cm/s
Left CCA prox sys: 131 cm/s
Left ICA dist dias: -19 cm/s
Left ICA prox sys: -68 cm/s
RCCADSYS: -100 cm/s
RCCAPDIAS: 16 cm/s
RCCAPSYS: 148 cm/s
RIGHT CCA MID DIAS: 22 cm/s
RIGHT ECA DIAS: -17 cm/s
RIGHT VERTEBRAL DIAS: -4 cm/s

## 2017-06-28 NOTE — Progress Notes (Signed)
[facility]  Patient name: Robert Zhang MRN: [id number] DOB: 04-Jan-1946 Sex: male   REASON FOR VISIT:    Follow up  HISOTRY OF PRESENT ILLNESS:   Robert Zhang is a 72 y.o. male returns today for follow-up.  In 2016 he had an episode where his left lip went numb.  This lasted about a minute.  He had a second event were his left lip and left foot went numb, this also lasting approximately 1 minute.  He has not had any additional symptoms.  A carotid Doppler study showed 50-69% stenosis.  He was sent for a CT scan for further evaluation.  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  There was only mild extracranial carotid disease.  Medical management was recommended.  He denies any new symptoms since I last saw him.  He does describe getting a tingling in his right leg and right scapula area however he comments that he has known back issues.  He continues to be medically managed for his diabetes.   PAST MEDICAL HISTORY:   Past Medical History:  Diagnosis Date  . Arthritis    bilateral legs /   Neck-Degenerative Disc Disease  . Carotid artery occlusion    Right Carotid   . Diabetes mellitus without complication ([place])   . Hypertension   . Leg cramps   . Prostate CA ([place])   . Sleep apnea    Stop Bang score of 5     FAMILY HISTORY:   Family History  Problem Relation Age of Onset  . Diabetes Mother   . Hypertension Father   . Heart disease Father        before age 63  . Heart attack Father   . Anesthesia problems Neg Hx   . Hypotension Neg Hx   . Malignant hyperthermia Neg Hx   . Pseudochol deficiency Neg Hx     SOCIAL HISTORY:   Social History   Tobacco Use  . Smoking status: Former Smoker    Last attempt to quit: 05/18/1993    Years since quitting: 24.1  . Smokeless tobacco: Never Used  Substance Use Topics  . Alcohol use: Yes     Alcohol/week: 2.4 - 3.6 oz    Types: 4 - 6 Shots of liquor per week    Comment: 2-3 mixed drinks on a saturday night     ALLERGIES:   Allergies  Allergen Reactions  . Penicillins Rash and Other (See Comments)    Childhood allergy Has patient had a PCN reaction causing immediate rash, facial/tongue/throat swelling, SOB or lightheadedness with hypotension: yes Has patient had a PCN reaction causing severe rash involving mucus membranes or skin necrosis: no Has patient had a PCN reaction that required hospitalization no Has patient had a PCN reaction occurring within the last 10 years: no If all of the above answers are "NO", then may proceed with Cephalosporin use.       CURRENT MEDICATIONS:   Current Outpatient Medications  Medication Sig Dispense Refill  . aspirin 81 MG tablet Take 1 tablet (81 mg total) by mouth daily. 30 tablet   . Cholecalciferol (VITAMIN D3) 5000 UNITS CAPS Take 1 capsule by mouth daily.    . Lancets (ONETOUCH ULTRASOFT) lancets     . naproxen (NAPROSYN) 500 MG tablet Take 250 mg by mouth 2 (two) times daily as needed for mild pain.    Marland Kitchen  olmesartan-hydrochlorothiazide (BENICAR HCT) 20-12.5 MG tablet Take 1 tablet by mouth daily.    . ONE TOUCH ULTRA TEST test strip     . potassium chloride SA (K-DUR,KLOR-CON) 20 MEQ tablet Take 20 mEq by mouth daily as needed (cramping).     . vitamin C (ASCORBIC ACID) 500 MG tablet Take 500 mg by mouth every evening.     No current facility-administered medications for this visit.     REVIEW OF SYSTEMS:   [X]  denotes positive finding, [ ]  denotes negative finding Cardiac  Comments:  Chest pain or chest pressure:    Shortness of breath upon exertion:    Short of breath when lying flat:    Irregular heart rhythm:        Vascular    Pain in calf, thigh, or hip brought on by ambulation:    Pain in feet at night that wakes you up from your sleep:  x   Blood clot in your veins:    Leg swelling:          Pulmonary    Oxygen at home:    Productive cough:     Wheezing:         Neurologic    Sudden weakness in arms or legs:     Sudden numbness in arms or legs:  x   Sudden onset of difficulty speaking or slurred speech:    Temporary loss of vision in one eye:     Problems with dizziness:         Gastrointestinal    Blood in stool:     Vomited blood:         Genitourinary    Burning when urinating:     Blood in urine:        Psychiatric    Major depression:         Hematologic    Bleeding problems:    Problems with blood clotting too easily:        Skin    Rashes or ulcers:        Constitutional    Fever or chills:      PHYSICAL EXAM:   Vitals:   06/28/17 1051 06/28/17 1054 06/28/17 1055  BP: (!) 183/90 (!) 162/92 (!) 183/90  Pulse: 78 79 79  Resp: 18    Temp: 98.7 F (37.1 C)    TempSrc: Oral    SpO2: 98%    Weight: 173 lb (78.5 kg)    Height: 5\' 7"  (1.702 m)      GENERAL: The patient is a well-nourished male, in no acute distress. The vital signs are documented above. CARDIAC: There is a regular rate and rhythm.  VASCULAR: Left carotid bruit PULMONARY: Non-labored respirations ABDOMEN: Soft and non-tender.  No pulsatile mass MUSCULOSKELETAL: There are no major deformities or cyanosis. NEUROLOGIC: No focal weakness or paresthesias are detected. SKIN: There are no ulcers or rashes noted. PSYCHIATRIC: The patient has a normal affect.  STUDIES:   I have reviewed his carotid duplex.  This shows 1-39% right carotid stenosis with a less than 50% common carotid stenosis.  The left carotid is without hemodynamically significant lesion.  The left vertebral artery is widely patent.  There is a high resistance signal in the right suggesting distal obstruction.  MEDICAL ISSUES:   History of left brain stroke: Patient is currently being followed for extracranial carotid disease.  He does not have any significant disease identified by duplex today.  He remains  asymptomatic.  We will continue with our plan of follow-up in 1 year.  At that time, he will have his duplex repeated.    Annamarie Major, MD [facility] [phone] Pager [phone]

## 2017-08-02 DIAGNOSIS — E782 Mixed hyperlipidemia: Secondary | ICD-10-CM | POA: Diagnosis not present

## 2017-08-02 DIAGNOSIS — E119 Type 2 diabetes mellitus without complications: Secondary | ICD-10-CM | POA: Diagnosis not present

## 2017-08-12 DIAGNOSIS — R972 Elevated prostate specific antigen [PSA]: Secondary | ICD-10-CM | POA: Diagnosis not present

## 2017-08-12 DIAGNOSIS — N410 Acute prostatitis: Secondary | ICD-10-CM | POA: Diagnosis not present

## 2017-08-12 DIAGNOSIS — Z1389 Encounter for screening for other disorder: Secondary | ICD-10-CM | POA: Diagnosis not present

## 2017-08-31 DIAGNOSIS — N411 Chronic prostatitis: Secondary | ICD-10-CM | POA: Diagnosis not present

## 2017-08-31 DIAGNOSIS — R972 Elevated prostate specific antigen [PSA]: Secondary | ICD-10-CM | POA: Diagnosis not present

## 2017-08-31 DIAGNOSIS — C61 Malignant neoplasm of prostate: Secondary | ICD-10-CM | POA: Diagnosis not present

## 2017-08-31 DIAGNOSIS — Z1389 Encounter for screening for other disorder: Secondary | ICD-10-CM | POA: Diagnosis not present

## 2017-08-31 DIAGNOSIS — N410 Acute prostatitis: Secondary | ICD-10-CM | POA: Diagnosis not present

## 2017-09-21 DIAGNOSIS — M67919 Unspecified disorder of synovium and tendon, unspecified shoulder: Secondary | ICD-10-CM | POA: Diagnosis not present

## 2017-09-21 DIAGNOSIS — M719 Bursopathy, unspecified: Secondary | ICD-10-CM

## 2017-09-21 DIAGNOSIS — IMO0002 Reserved for concepts with insufficient information to code with codable children: Secondary | ICD-10-CM | POA: Insufficient documentation

## 2017-09-21 HISTORY — PX: OTHER SURGICAL HISTORY: SHX169

## 2017-09-22 ENCOUNTER — Ambulatory Visit: Payer: Medicare Other | Admitting: Urology

## 2017-09-22 DIAGNOSIS — R351 Nocturia: Secondary | ICD-10-CM

## 2017-09-22 DIAGNOSIS — N401 Enlarged prostate with lower urinary tract symptoms: Secondary | ICD-10-CM | POA: Diagnosis not present

## 2017-09-22 DIAGNOSIS — C61 Malignant neoplasm of prostate: Secondary | ICD-10-CM

## 2017-09-23 ENCOUNTER — Encounter: Payer: Self-pay | Admitting: Radiation Oncology

## 2017-09-23 DIAGNOSIS — C61 Malignant neoplasm of prostate: Secondary | ICD-10-CM | POA: Diagnosis not present

## 2017-09-28 ENCOUNTER — Other Ambulatory Visit: Payer: Self-pay | Admitting: Urology

## 2017-09-28 DIAGNOSIS — C61 Malignant neoplasm of prostate: Secondary | ICD-10-CM

## 2017-10-10 IMAGING — MR MR SHOULDER*R* W/O CM
4 of 5 series · 30 of 40 positions shown · non-contrast
Comparison: None.

CLINICAL DATA: Right shoulder pain. Aggravated by any movement. No
specific injury.

EXAM:
MRI OF THE RIGHT SHOULDER WITHOUT CONTRAST
TECHNIQUE: Multiplanar, multisequence MR imaging of the shoulder was performed.
No intravenous contrast was administered.

[Series 7: T2 fat-sat · sagittal · left · 3.0mm · 0.55mm/px · 7 of 21 slices shown (1 of 3)]
[im 1/21]
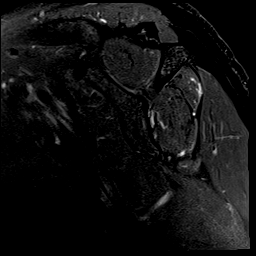
[im 4/21]
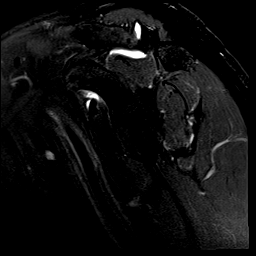
[im 7/21]
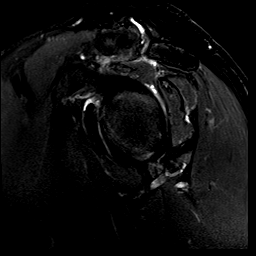
[im 11/21]
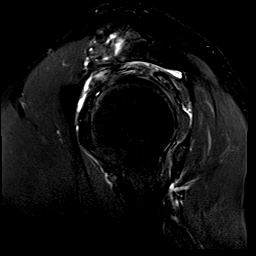
[im 14/21]
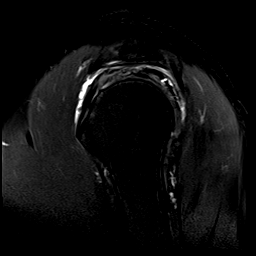
[im 17/21]
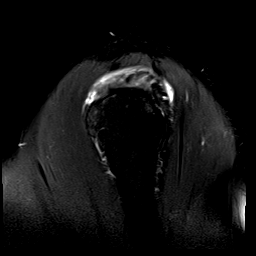
[im 21/21]
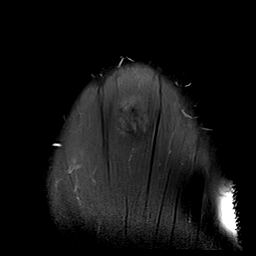

[Series 8: T2 fat-sat · axial · left · 3.0mm · 0.55mm/px · z∈[-81,+18]mm · 9 of 28 slices shown (2 of 3)]
[im 1/28]
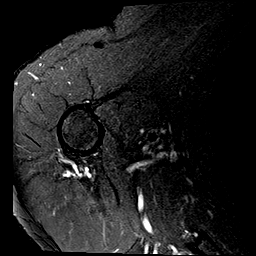
[im 4/28]
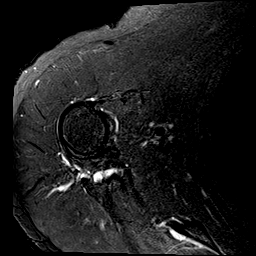
[im 7/28]
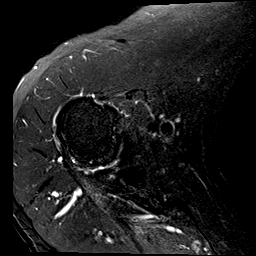
[im 11/28]
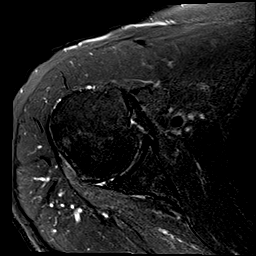
[im 14/28]
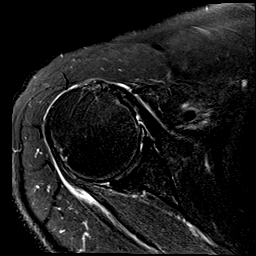
[im 17/28]
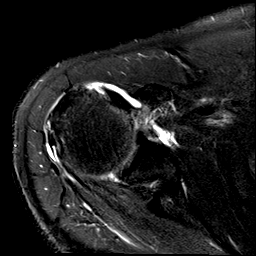
[im 21/28]
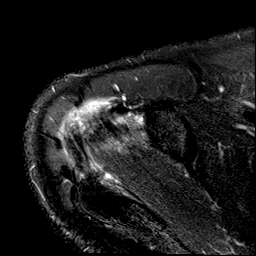
[im 24/28]
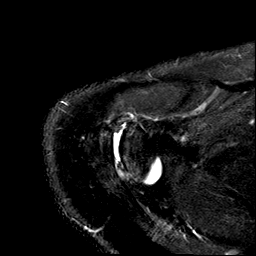
[im 28/28]
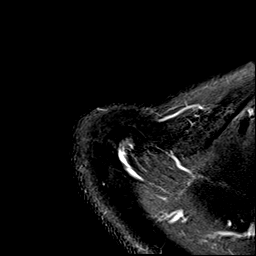

[Series 10: PD · oblique · left · 3.0mm · 0.27mm/px · 8 of 21 slices shown]
[im 1/21]
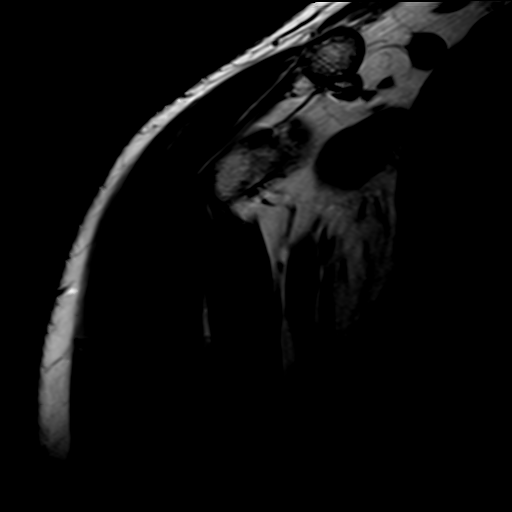
[im 3/21]
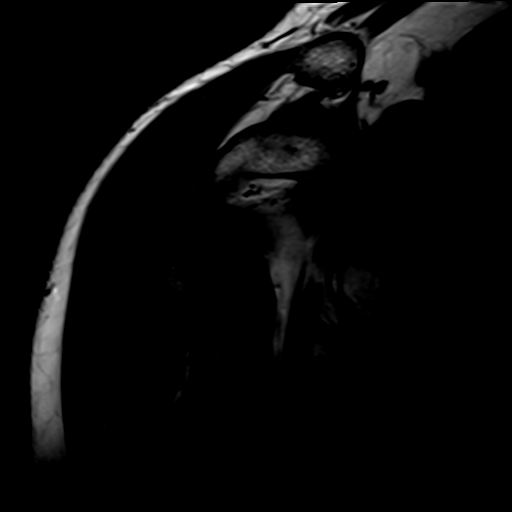
[im 6/21]
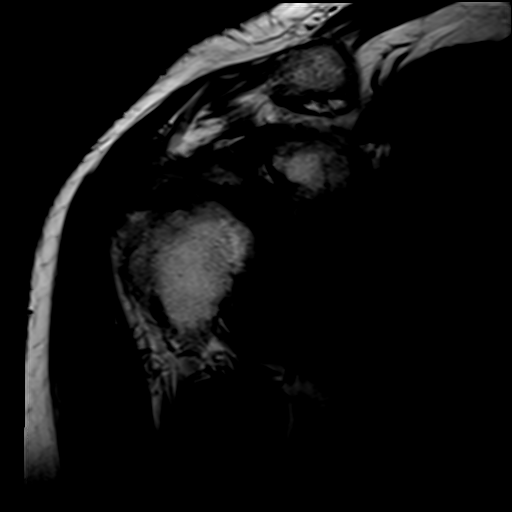
[im 9/21]
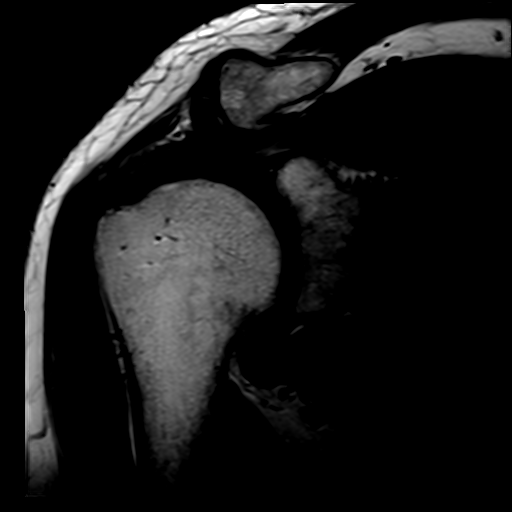
[im 12/21]
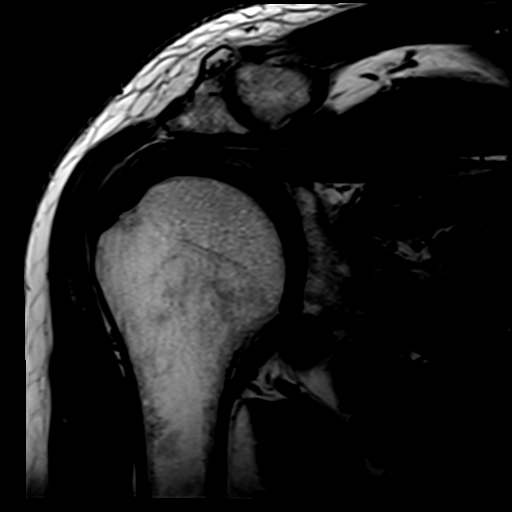
[im 15/21]
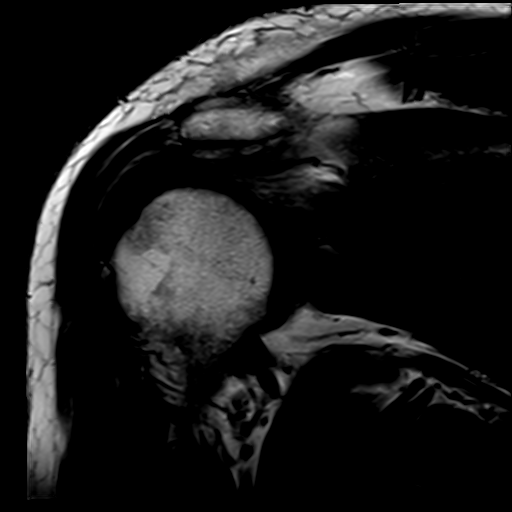
[im 18/21]
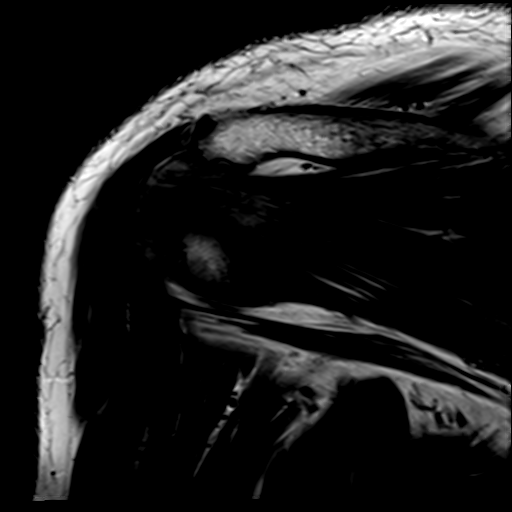
[im 21/21]
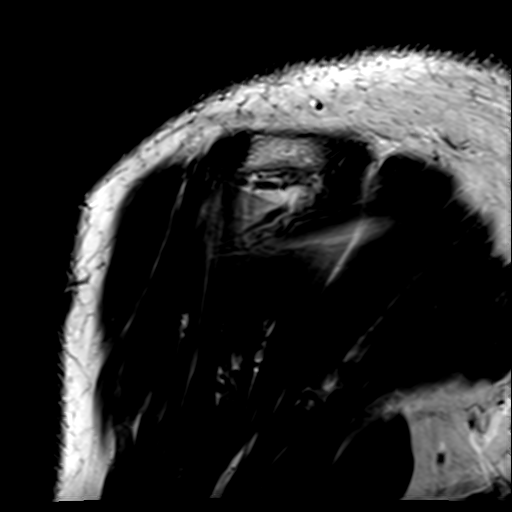

[Series 11: T2 fat-sat · oblique · left · 3.0mm · 0.27mm/px · 6 of 21 slices shown (3 of 3)]
[im 1/21]
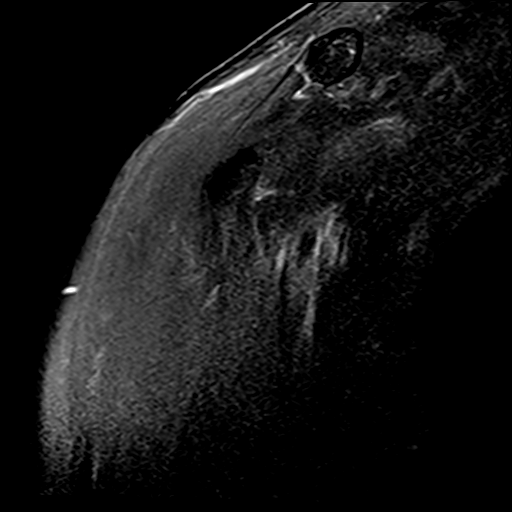
[im 3/21]
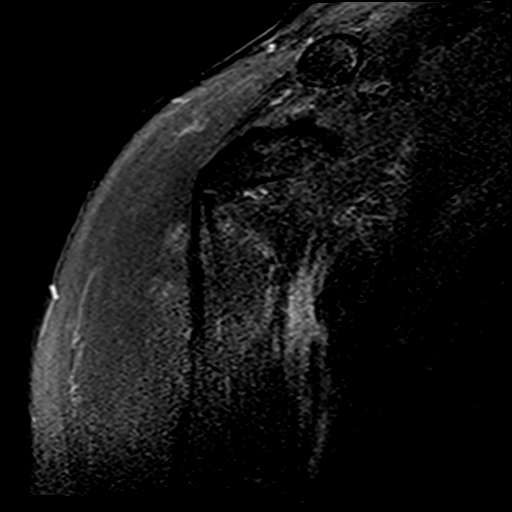
[im 6/21]
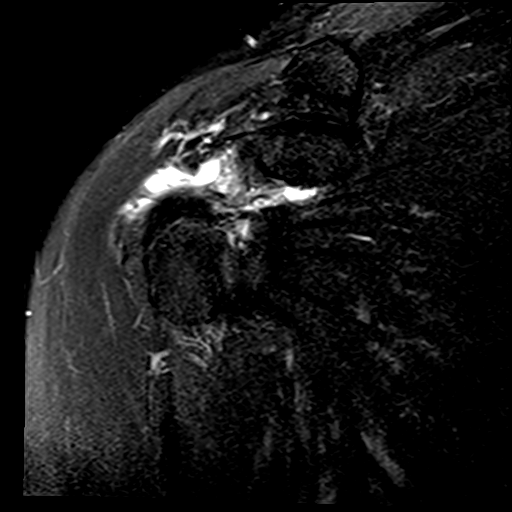
[im 9/21]
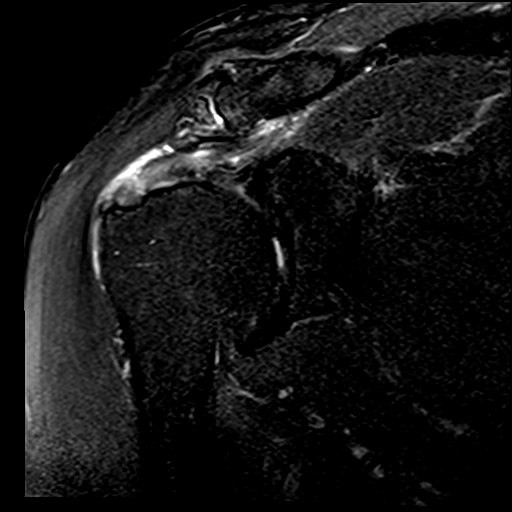
[im 12/21]
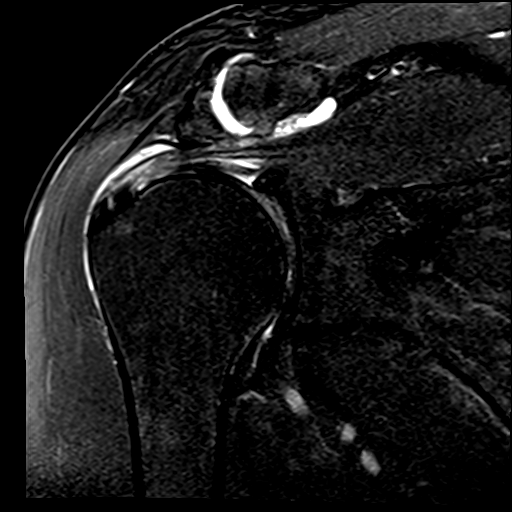
[im 18/21]
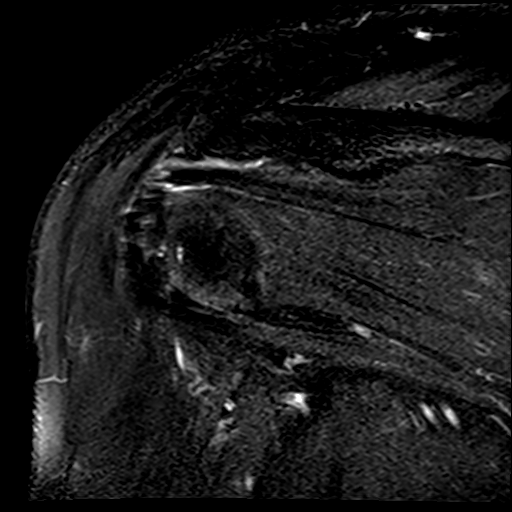

[30 of 40 positions shown; findings below may reference images not displayed]

FINDINGS: Rotator cuff: Severe tendinosis of the supraspinatus tendon with a
small high-grade partial-thickness bursal surface tear. Severe
tendinosis of the infraspinatus tendon. Teres minor tendon is
intact. Subscapularis tendon is intact.

Muscles: No atrophy or fatty replacement of nor abnormal signal
within, the muscles of the rotator cuff.

Biceps long head: Intraarticular portion of the long head of the
biceps tendon is not visualized concerning for a complete tear.

Acromioclavicular Joint: Moderate degenerative changes of the
acromioclavicular joint. Type II acromion. Small amount
subacromial/subdeltoid bursal fluid.

Glenohumeral Joint: No joint effusion.  No chondral defect.

Labrum: Grossly intact, but evaluation is limited by lack of
intraarticular fluid.

Bones:  No marrow signal abnormality.  No fracture or dislocation.

Other: None
IMPRESSION: 1. Severe tendinosis of the supraspinatus tendon with a small
high-grade partial-thickness bursal surface tear.
2. Severe tendinosis of the infraspinatus tendon.
3. Intraarticular portion of the long head of the biceps tendon is
not visualized concerning for a complete tear.
4. Mild subacromial/subdeltoid bursitis.

## 2017-10-12 ENCOUNTER — Encounter (HOSPITAL_COMMUNITY)
Admission: RE | Admit: 2017-10-12 | Discharge: 2017-10-12 | Disposition: A | Discharge status: Home / Self Care | Payer: Medicare Other | Source: Ambulatory Visit | Attending: Urology | Admitting: Urology

## 2017-10-12 ENCOUNTER — Ambulatory Visit (HOSPITAL_COMMUNITY)
Admission: RE | Admit: 2017-10-12 | Discharge: 2017-10-12 | Disposition: A | Discharge status: Home / Self Care | Payer: Medicare Other | Source: Ambulatory Visit | Attending: Urology | Admitting: Urology

## 2017-10-12 ENCOUNTER — Encounter (HOSPITAL_COMMUNITY): Payer: Self-pay

## 2017-10-12 DIAGNOSIS — K7689 Other specified diseases of liver: Secondary | ICD-10-CM

## 2017-10-12 DIAGNOSIS — C61 Malignant neoplasm of prostate: Secondary | ICD-10-CM

## 2017-10-12 DIAGNOSIS — I700 Atherosclerosis of aorta: Secondary | ICD-10-CM

## 2017-10-12 DIAGNOSIS — K769 Liver disease, unspecified: Secondary | ICD-10-CM | POA: Insufficient documentation

## 2017-10-12 HISTORY — DX: Other specified diseases of liver: K76.89

## 2017-10-12 HISTORY — DX: Atherosclerosis of aorta: I70.0

## 2017-10-12 LAB — POCT I-STAT CREATININE: CREATININE: 0.9 mg/dL (ref 0.61–1.24)

## 2017-10-12 MED ORDER — TECHNETIUM TC 99M MEDRONATE IV KIT
20.0000 | PACK | Freq: Once | INTRAVENOUS | Status: AC | PRN
  Administered 2017-10-12: 21 via INTRAVENOUS

## 2017-10-12 MED ORDER — IOPAMIDOL (ISOVUE-300) INJECTION 61%
100.0000 mL | Freq: Once | INTRAVENOUS | Status: AC | PRN
  Administered 2017-10-12: 100 mL via INTRAVENOUS

## 2017-10-22 ENCOUNTER — Encounter: Payer: Self-pay | Admitting: Radiation Oncology

## 2017-10-22 NOTE — Progress Notes (Addendum)
GU Location of Tumor / Histology: prostatic adenocarcinoma  If Prostate Cancer, Gleason Score is (3 + 3) and PSA is (7.2) at diagnosis. Prostate volume: 40.3 grams.  Robert Zhang has been followed at the [facility] by Dr. Hilma Favors. His PSA has been elevated since 2012. He has severe LUTS and has never had treatment. Dr. Hilma Favors referred the patient to Dr. Alyson Ingles in January for further evaluation of his elevated PSA.   08/31/17 PSA  10.1 08/03/17 PSA  11.30 05/31/17 PSA  8.8 02/18/17 PSA  7.7 11/17/16  PSA  7.9 08/11/16 PSA  9.5 08/08/15 PSA  7.68  Biopsies of prostate (if applicable) revealed:    Past/Anticipated interventions by urology, if any: biopsy, discussion of treatment options, referral to radiation oncology to discuss IMRT vs. brachytherapy  Past/Anticipated interventions by medical oncology, if any: no  Weight changes, if any: no  Bowel/Bladder complaints, if any: urinary frequency, nocturia, and urinary leakage related to urgency and diuretic. Denies dysuria or hematuria.    Nausea/Vomiting, if any: no  Pain issues, if any:  Right shoulder related to tear in rotator cuff taking cortisone injections  SAFETY ISSUES:  Prior radiation? no  Pacemaker/ICD? no  Possible current pregnancy? no  Is the patient on methotrexate? no  Current Complaints / other details:  72 year old male. Married. Retired. Resides in [place]. Hasn't smoked since 1977. UL:AGTXMIWOEH Brother passed from pancreatic ca. Most interested in seeding.

## 2017-10-25 ENCOUNTER — Other Ambulatory Visit: Payer: Self-pay

## 2017-10-25 ENCOUNTER — Encounter: Payer: Self-pay | Admitting: Radiation Oncology

## 2017-10-25 ENCOUNTER — Ambulatory Visit
Admission: RE | Admit: 2017-10-25 | Discharge: 2017-10-25 | Disposition: A | Discharge status: Home / Self Care | Payer: Medicare Other | Source: Ambulatory Visit | Attending: Radiation Oncology | Admitting: Radiation Oncology

## 2017-10-25 ENCOUNTER — Encounter: Payer: Self-pay | Admitting: Medical Oncology

## 2017-10-25 DIAGNOSIS — R972 Elevated prostate specific antigen [PSA]: Secondary | ICD-10-CM | POA: Diagnosis not present

## 2017-10-25 DIAGNOSIS — C61 Malignant neoplasm of prostate: Secondary | ICD-10-CM | POA: Diagnosis not present

## 2017-10-25 DIAGNOSIS — K429 Umbilical hernia without obstruction or gangrene: Secondary | ICD-10-CM | POA: Insufficient documentation

## 2017-10-25 DIAGNOSIS — I6529 Occlusion and stenosis of unspecified carotid artery: Secondary | ICD-10-CM | POA: Diagnosis not present

## 2017-10-25 DIAGNOSIS — K760 Fatty (change of) liver, not elsewhere classified: Secondary | ICD-10-CM | POA: Diagnosis not present

## 2017-10-25 DIAGNOSIS — K5730 Diverticulosis of large intestine without perforation or abscess without bleeding: Secondary | ICD-10-CM | POA: Insufficient documentation

## 2017-10-25 DIAGNOSIS — Z8042 Family history of malignant neoplasm of prostate: Secondary | ICD-10-CM | POA: Diagnosis not present

## 2017-10-25 DIAGNOSIS — Z7982 Long term (current) use of aspirin: Secondary | ICD-10-CM | POA: Diagnosis not present

## 2017-10-25 DIAGNOSIS — Z809 Family history of malignant neoplasm, unspecified: Secondary | ICD-10-CM | POA: Insufficient documentation

## 2017-10-25 DIAGNOSIS — E119 Type 2 diabetes mellitus without complications: Secondary | ICD-10-CM | POA: Diagnosis not present

## 2017-10-25 DIAGNOSIS — Z79899 Other long term (current) drug therapy: Secondary | ICD-10-CM | POA: Insufficient documentation

## 2017-10-25 DIAGNOSIS — Z87891 Personal history of nicotine dependence: Secondary | ICD-10-CM | POA: Diagnosis not present

## 2017-10-25 DIAGNOSIS — I700 Atherosclerosis of aorta: Secondary | ICD-10-CM | POA: Diagnosis not present

## 2017-10-25 DIAGNOSIS — G4730 Sleep apnea, unspecified: Secondary | ICD-10-CM | POA: Diagnosis not present

## 2017-10-25 HISTORY — DX: Post-traumatic stress disorder, unspecified: F43.10

## 2017-10-25 HISTORY — DX: Cerebral infarction, unspecified: I63.9

## 2017-10-25 HISTORY — DX: Other cervical disc degeneration, unspecified cervical region: M50.30

## 2017-10-25 HISTORY — DX: Other intervertebral disc displacement, lumbosacral region: M51.27

## 2017-10-25 NOTE — Progress Notes (Signed)
Introduced myself to Mr. Robert Zhang as the prostate nurse navigator and my role. He states he has biopsy in January 2018 that was positive for cancer. In May 2019 his PSA continued to rise and he was encourage to have a repeat biopsy. He elected to move forward with treatment instead of another biopsy. He has research the various treatments for prostate cancer and has chosen brachytherapy. I will continue to follow and asked him to call with questions and or concerns.

## 2017-10-25 NOTE — Addendum Note (Signed)
Encounter addended by: Heywood Footman, RN on: 10/25/2017 4:06 PM  Actions taken: Charge Capture section accepted

## 2017-10-25 NOTE — Progress Notes (Signed)
Radiation Oncology         [phone] ________________________________  Initial outpatient Consultation  Name: Robert Zhang MRN: [id number]  Date: 10/25/2017  DOB: 11-Aug-1945  EV:[id number], Jenny Reichmann, MD  McKenzie, Candee Furbish, MD   REFERRING PHYSICIAN: Cleon Gustin, MD  DIAGNOSIS: 72 y.o. gentleman with Stage T1c adenocarcinoma of the prostate with Gleason Score of 3+3, and PSA of 10.1.    ICD-10-CM   1. Malignant neoplasm of prostate ([place]) [facility] Referral to Genetics  2. Family history of cancer Z80.9 Ambulatory Referral to Genetics    HISTORY OF PRESENT ILLNESS: Robert Zhang is a 72 y.o. male with a diagnosis of prostate cancer. He was noted to have an elevated PSA of 7.2 by his primary care physician, Dr. Hilma Favors.  His PSA has been elevated since 2012. Accordingly, he was referred for evaluation in urology by Dr. Alyson Ingles in January 2018. The patient proceeded to transrectal ultrasound with 12 biopsies of the prostate on 05/20/2016.  The prostate volume measured 40.3 cc.  Out of 12 core biopsies, 3 were positive.  The maximum Gleason score was 3+3=6, and this was seen in left apex, right apex, and right mid. The patient has severe LUTS but has not received treatment for this. On 09/22/17, his PSA had increased to 10.1. CT and bone scans on 10/12/17 were negative for metastasis. The patient reviewed the biopsy results with his urologist and he has kindly been referred today for discussion of potential radiation treatment options.    PREVIOUS RADIATION THERAPY: No  PAST MEDICAL HISTORY:  Past Medical History:  Diagnosis Date  . Arthritis    bilateral legs /   Neck-Degenerative Disc Disease  . Carotid artery occlusion    Right Carotid   . Diabetes mellitus without complication ([place])   . Hypertension   . Leg cramps   . Prostate CA ([place])   . Sleep apnea    Stop Bang score of 5      PAST SURGICAL HISTORY: Past Surgical History:  Procedure Laterality Date  . CATARACT  EXTRACTION W/PHACO  07/20/2011   Procedure: CATARACT EXTRACTION PHACO AND INTRAOCULAR LENS PLACEMENT (IOC);  Surgeon: Williams Che, MD;  Location: [facility];  Service: Ophthalmology;  Laterality: Right;  CDE=25.73  . CATARACT EXTRACTION W/PHACO  08/31/2011   Procedure: CATARACT EXTRACTION PHACO AND INTRAOCULAR LENS PLACEMENT (IOC);  Surgeon: Williams Che, MD;  Location: [facility];  Service: Ophthalmology;  Laterality: Left;  CDE: 38.59  . COLONOSCOPY N/A 09/10/2016   Procedure: COLONOSCOPY;  Surgeon: Rogene Houston, MD;  Location: [facility] SU[facility];  Service: Endoscopy;  Laterality: N/A;  1200  . COLONOSCOPY W/ POLYPECTOMY  2011   [facility]-Dr. Rehman  . head reconstruction  1966   due to head injury in war  . TRIGGER FINGER RELEASE      FAMILY HISTORY:  Family History  Problem Relation Age of Onset  . Diabetes Mother   . Hypertension Father   . Heart disease Father        before age 5  . Heart attack Father   . Pancreatic cancer Brother   . Anesthesia problems Neg Hx   . Hypotension Neg Hx   . Malignant hyperthermia Neg Hx   . Pseudochol deficiency Neg Hx     SOCIAL HISTORY:  Social History   Socioeconomic History  . Marital status: Married    Spouse name: Not on file  . Number of children: Not on file  . Years  of education: Not on file  . Highest education level: Not on file  Occupational History  . Occupation: retired  Scientific laboratory technician  . Financial resource strain: Not on file  . Food insecurity:    Worry: Not on file    Inability: Not on file  . Transportation needs:    Medical: Not on file    Non-medical: Not on file  Tobacco Use  . Smoking status: Former Smoker    Packs/day: 1.00    Years: 13.00    Pack years: 13.00    Types: Cigarettes    Last attempt to quit: 04/17/1976    Years since quitting: 41.5  . Smokeless tobacco: Never Used  Substance and Sexual Activity  . Alcohol use: Yes    Alcohol/week: 2.4 - 3.6 oz    Types: 4 - 6 Shots of liquor per week     Comment: 2-3 mixed drinks on a saturday night  . Drug use: No  . Sexual activity: Not Currently  Lifestyle  . Physical activity:    Days per week: Not on file    Minutes per session: Not on file  . Stress: Not on file  Relationships  . Social connections:    Talks on phone: Not on file    Gets together: Not on file    Attends religious service: Not on file    Active member of club or organization: Not on file    Attends meetings of clubs or organizations: Not on file    Relationship status: Not on file  . Intimate partner violence:    Fear of current or ex partner: Not on file    Emotionally abused: Not on file    Physically abused: Not on file    Forced sexual activity: Not on file  Other Topics Concern  . Not on file  Social History Narrative  . 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  ALLERGIES: Penicillins  MEDICATIONS:  Current Outpatient Medications  Medication Sig Dispense Refill  . aspirin 81 MG tablet Take 1 tablet (81 mg total) by mouth daily. 30 tablet   . Aspirin-Calcium Carbonate (BAYER WOMENS) [phone] MG TABS Take by mouth.    . Cholecalciferol (VITAMIN D3) 5000 UNITS CAPS Take 1 capsule by mouth daily.    . hydrochlorothiazide (HYDRODIURIL) 12.5 MG tablet Take by mouth.    . Lancets (ONETOUCH ULTRASOFT) lancets     . naproxen (NAPROSYN) 500 MG tablet Take 250 mg by mouth 2 (two) times daily as needed for mild pain.    Marland Kitchen olmesartan-hydrochlorothiazide (BENICAR HCT) 20-12.5 MG tablet Take 1 tablet by mouth daily.    . ONE TOUCH ULTRA TEST test strip     . potassium chloride SA (K-DUR,KLOR-CON) 20 MEQ tablet Take 20 mEq by mouth daily as needed (cramping).     . vitamin C (ASCORBIC ACID) 500 MG tablet Take 500 mg by mouth every evening.     No current facility-administered medications for this encounter.     REVIEW OF SYSTEMS:  On review of systems, the patient reports that he is doing well overall. He denies any chest  pain, shortness of breath, cough, fevers, chills, night sweats, unintended weight changes. He denies any bowel disturbances, and denies abdominal pain, nausea or vomiting. He denies any new musculoskeletal or joint aches or pains. His IPSS was 11, indicating moderate urinary symptoms. He is able to complete sexual activity with some attempts. He endorses urinary frequency, nocturia,  and leakage related to leakage and diuretic. He denies dysuria and hematuria. He reports that his brother died from pancreatic cancer. He does report of exposure to [place] in [place]. A complete review of systems is obtained and is otherwise negative.    PHYSICAL EXAM:  Wt Readings from Last 3 Encounters:  10/25/17 171 lb 3.2 oz (77.7 kg)  06/28/17 173 lb (78.5 kg)  09/10/16 164 lb (74.4 kg)   Temp Readings from Last 3 Encounters:  10/25/17 97.7 F (36.5 C)  06/28/17 98.7 F (37.1 C) (Oral)  09/10/16 97.8 F (36.6 C) (Oral)   BP Readings from Last 3 Encounters:  10/25/17 (!) 188/93  06/28/17 (!) 183/90  09/10/16 138/81   Pulse Readings from Last 3 Encounters:  10/25/17 71  06/28/17 79  09/10/16 87   Pain Assessment Pain Score: 0-No pain/10  In general this is a well appearing caucasian male in no acute distress. He is alert and oriented x4 and appropriate throughout the examination. HEENT reveals that the patient is normocephalic, atraumatic. EOMs are intact. PERRLA. Skin is intact without any evidence of gross lesions. Cardiopulmonary assessment is negative for acute distress and he exhibits normal effort.     KPS = 100  100 - Normal; no complaints; no evidence of disease. 90   - Able to carry on normal activity; minor signs or symptoms of disease. 80   - Normal activity with effort; some signs or symptoms of disease. 27   - Cares for self; unable to carry on normal activity or to do active work. 60   - Requires occasional assistance, but is able to care for most of his personal needs. 50    - Requires considerable assistance and frequent medical care. 64   - Disabled; requires special care and assistance. 38   - Severely disabled; hospital admission is indicated although death not imminent. 81   - Very sick; hospital admission necessary; active supportive treatment necessary. 10   - Moribund; fatal processes progressing rapidly. 0     - Dead  Karnofsky DA, Abelmann Clifton, Craver LS and Burchenal [facility] [phone]) The use of the nitrogen mustards in the palliative treatment of carcinoma: with particular reference to bronchogenic carcinoma Cancer 1 634-56  LABORATORY DATA:  Lab Results  Component Value Date   HGB 17.0 07/14/2011   HCT 48.6 07/14/2011   Lab Results  Component Value Date   NA 139 07/14/2011   K 4.1 07/14/2011   CL 99 07/14/2011   CO2 29 07/14/2011   No results found for: ALT, AST, GGT, ALKPHOS, BILITOT   RADIOGRAPHY: Ct Abdomen Pelvis W Wo Contrast  Result Date: 10/12/2017 CLINICAL DATA:  Prostate cancer diagnosed in January 2018. Urinary frequency. History of diabetes. EXAM: CT ABDOMEN AND PELVIS WITHOUT AND WITH CONTRAST TECHNIQUE: Multidetector CT imaging of the abdomen and pelvis was performed following the standard protocol before and following the bolus administration of intravenous contrast. CONTRAST:  186m ISOVUE-300 IOPAMIDOL (ISOVUE-300) INJECTION 61% COMPARISON:  Abdominopelvic CT 11/26/2016. FINDINGS: Lower chest: The visualized lung bases are clear. No significant pleural or pericardial effusion. Hepatobiliary: Mild hepatic steatosis. Postcontrast, the liver enhances normally. No change in probable 5 mm subcapsular cyst in the right hepatic lobe, best seen on coronal image 47/10. No evidence of gallstones, gallbladder wall thickening or biliary dilatation. Pancreas: Unremarkable. No pancreatic ductal dilatation or surrounding inflammatory changes. Spleen: Normal in size without focal abnormality. Adrenals/Urinary Tract: Stable mild fullness of the left adrenal  gland. The right adrenal  gland appears normal. Pre contrast images demonstrate no renal, ureteral or bladder calculi. There is mild symmetric perinephric soft tissue stranding bilaterally. Post-contrast, both kidneys enhance normally. There is no evidence of renal mass or hydronephrosis. The bladder is mildly distended without focal abnormality. Stomach/Bowel: No evidence of bowel wall thickening, distention or surrounding inflammatory change. The appendix appears normal. There is mild sigmoid diverticulosis. Vascular/Lymphatic: There are no enlarged abdominal or pelvic lymph nodes. Aortic and branch vessel atherosclerosis. No acute vascular findings. Reproductive: Stable mild enlargement of and calcifications within the prostate gland. Probable bilateral vasectomy clips. Other: Stable small umbilical hernia containing only fat. No ascites. Musculoskeletal: No acute or significant osseous findings. Multilevel spondylosis. IMPRESSION: 1. Stable examination without evidence of metastatic disease in the abdomen or pelvis. 2. Stable low-density lesion in the right hepatic lobe, likely a cyst. 3.  Aortic Atherosclerosis (ICD10-I70.0). Electronically Signed   By: Richardean Sale M.D.   On: 10/12/2017 14:24   Nm Bone Scan Whole Body  Result Date: 10/12/2017 CLINICAL DATA:  Prostate cancer. Right shoulder and occasional right ankle pain. No recent injury. EXAM: NUCLEAR MEDICINE WHOLE BODY BONE SCAN TECHNIQUE: Whole body anterior and posterior images were obtained approximately 3 hours after intravenous injection of radiopharmaceutical. RADIOPHARMACEUTICALS:  21.0 mCi Technetium-57mMDP IV COMPARISON:  CT same date.  Whole-body bone scan 11/25/2016. FINDINGS: There is no osseous uptake suspicious for metastatic disease. The proximal femoral activity appears normal. There is scattered mild degenerative activity in the cervical and lumbar spine. There is also stable mild degenerative activity in the shoulders, wrists,  knees and ankles. The soft tissue activity is normal. IMPRESSION: No evidence of osseous metastatic disease. Electronically Signed   By: WRichardean SaleM.D.   On: 10/12/2017 14:27      IMPRESSION/PLAN: 1. 72y.o. gentleman with Stage T1c adenocarcinoma of the prostate with Gleason Score of 3+3, and PSA of 10.1. We met with the patient and reviewed the findings and workup thus far.  We discussed the natural history of prostate cancer.  We reviewed the the implications of T-stage, Gleason's Score, and PSA on decision-making and outcomes in prostate cancer.  We discussed radiation treatment in the management of prostate cancer with regard to the logistics and delivery of external beam radiation treatment as well as the logistics and delivery of prostate brachytherapy.  We compared and contrasted each of these approaches and also compared these against prostatectomy.  The patient expressed interest in prostate brachytherapy and we will notify Dr. MAlyson Inglesso we can move forward in scheduling. We discussed the role of SpaceOAR gel placement as well at the time of his procedure.  2. Possible genetic predisposition to malignancy. The patient is a candidate for genetic testing given his personal and family history. He was offered referral and is interested if he can meet with genetics and we will try to schedule this at a time when he is already at the cancer center.  We enjoyed meeting with him today, and will look forward to participating in the care of this very nice gentleman.     ACarola Rhine PAC  And  MSheral ApleyMTammi Klippel M.D.  This document serves as a record of services personally performed by MTyler Pita MD and AShona Simpson PA-C. It was created on their behalf by JLinward Natal a trained medical scribe. The creation of this record is based on the scribe's personal observations and the provider's statements to them. This document has been checked and approved by the attending  provider.

## 2017-10-25 NOTE — Progress Notes (Signed)
See progress note under physician encounter. 

## 2017-10-26 ENCOUNTER — Encounter: Payer: Self-pay | Admitting: Radiation Oncology

## 2017-10-26 NOTE — Addendum Note (Signed)
Encounter addended by: Heywood Footman, RN on: 10/26/2017 10:08 AM  Actions taken: Vitals modified, Sign clinical note, Order list changed, Order Reconciliation Section accessed, Home Medications modified, Medication List reviewed, Medication taking status modified

## 2017-10-29 ENCOUNTER — Telehealth: Payer: Self-pay | Admitting: *Deleted

## 2017-10-29 NOTE — Telephone Encounter (Signed)
CALLED PATIENT TO INFORM OF PRE-SEED PLANNING CT FOR 12-03-17, LVM FOR A RETURN CALL

## 2017-12-02 ENCOUNTER — Telehealth: Payer: Self-pay | Admitting: *Deleted

## 2017-12-02 NOTE — Telephone Encounter (Signed)
CALLED PATIENT TO REMIND OF PRE-SEED APPT. FOR 12-03-17, SPOKE WITH PATIENT AND HE IS AWARE OF THESE APPTS.

## 2017-12-02 NOTE — Progress Notes (Signed)
  Radiation Oncology         [phone]) [phone] ________________________________  Name: Robert Zhang MRN: [id number]  Date: 12/03/2017  DOB: 02/18/1946  SIMULATION AND TREATMENT PLANNING NOTE PUBIC ARCH STUDY  DE:[id number], Jenny Reichmann, MD  Cleon Gustin, MD  DIAGNOSIS: 72 y.o. gentleman with Stage T1c adenocarcinoma of the prostate with Gleason Score of 3+3, and PSA of 10.1.     ICD-10-CM   1. Malignant neoplasm of prostate ([place]) C61     COMPLEX SIMULATION:  The patient presented today for evaluation for possible prostate seed implant. He was brought to the radiation planning suite and placed supine on the CT couch. A 3-dimensional image study set was obtained in upload to the planning computer. There, on each axial slice, I contoured the prostate gland. Then, using three-dimensional radiation planning tools I reconstructed the prostate in view of the structures from the transperineal needle pathway to assess for possible pubic arch interference. In doing so, I did not appreciate any pubic arch interference. Also, the patient's prostate volume was estimated based on the drawn structure. The volume was 30 cc.  Given the pubic arch appearance and prostate volume, patient remains a good candidate to proceed with prostate seed implant. Today, he freely provided informed written consent to proceed.    PLAN: The patient will undergo prostate seed implant.   ________________________________  Sheral Apley. Tammi Klippel, M.D.    This document serves as a record of services personally performed by Tyler Pita, MD. It was created on his behalf by Margit Banda, a trained medical scribe. The creation of this record is based on the scribe's personal observations and the provider's statements to them. This document has been checked and approved by the attending provider.

## 2017-12-03 ENCOUNTER — Ambulatory Visit (HOSPITAL_COMMUNITY)
Admission: RE | Admit: 2017-12-03 | Discharge: 2017-12-03 | Disposition: A | Discharge status: Home / Self Care | Payer: Medicare Other | Source: Ambulatory Visit | Attending: Urology | Admitting: Urology

## 2017-12-03 ENCOUNTER — Other Ambulatory Visit: Payer: Self-pay

## 2017-12-03 ENCOUNTER — Encounter (HOSPITAL_COMMUNITY)
Admission: RE | Admit: 2017-12-03 | Discharge: 2017-12-03 | Disposition: A | Discharge status: Home / Self Care | Payer: Medicare Other | Source: Ambulatory Visit | Attending: Urology | Admitting: Urology

## 2017-12-03 ENCOUNTER — Ambulatory Visit
Admission: RE | Admit: 2017-12-03 | Discharge: 2017-12-03 | Disposition: A | Discharge status: Home / Self Care | Payer: Medicare Other | Source: Ambulatory Visit | Attending: Radiation Oncology | Admitting: Radiation Oncology

## 2017-12-03 ENCOUNTER — Other Ambulatory Visit: Payer: Self-pay | Admitting: Urology

## 2017-12-03 ENCOUNTER — Encounter: Payer: Self-pay | Admitting: Medical Oncology

## 2017-12-03 DIAGNOSIS — R918 Other nonspecific abnormal finding of lung field: Secondary | ICD-10-CM | POA: Diagnosis not present

## 2017-12-03 DIAGNOSIS — C61 Malignant neoplasm of prostate: Secondary | ICD-10-CM

## 2017-12-03 DIAGNOSIS — Z01818 Encounter for other preprocedural examination: Secondary | ICD-10-CM | POA: Diagnosis not present

## 2018-01-03 DIAGNOSIS — X32XXXD Exposure to sunlight, subsequent encounter: Secondary | ICD-10-CM | POA: Diagnosis not present

## 2018-01-03 DIAGNOSIS — L821 Other seborrheic keratosis: Secondary | ICD-10-CM | POA: Diagnosis not present

## 2018-01-03 DIAGNOSIS — L738 Other specified follicular disorders: Secondary | ICD-10-CM | POA: Diagnosis not present

## 2018-01-03 DIAGNOSIS — L570 Actinic keratosis: Secondary | ICD-10-CM | POA: Diagnosis not present

## 2018-01-11 NOTE — Progress Notes (Signed)
Pt called via phone. Inquiring if could get his lab work done in [place] since he has an annual pcp visit same day.  Pt verbalized understanding about certain lab work that is ordered for procedure.  Pt agreed to get lab work done at [facility] 01-13-2018 morning after his pcp visit.  Spoke w/ [facility] , margaret , and printed lab orders and faxed to [facility].

## 2018-01-12 ENCOUNTER — Other Ambulatory Visit: Payer: Self-pay | Admitting: Urology

## 2018-01-12 DIAGNOSIS — C61 Malignant neoplasm of prostate: Secondary | ICD-10-CM

## 2018-01-13 ENCOUNTER — Inpatient Hospital Stay (HOSPITAL_COMMUNITY): Admission: RE | Admit: 2018-01-13 | Payer: Medicare Other | Source: Ambulatory Visit

## 2018-01-13 ENCOUNTER — Other Ambulatory Visit: Payer: Self-pay

## 2018-01-13 ENCOUNTER — Other Ambulatory Visit (HOSPITAL_COMMUNITY)
Admission: RE | Admit: 2018-01-13 | Discharge: 2018-01-13 | Disposition: A | Discharge status: Home / Self Care | Payer: Medicare Other | Source: Ambulatory Visit | Attending: Urology | Admitting: Urology

## 2018-01-13 ENCOUNTER — Encounter (HOSPITAL_BASED_OUTPATIENT_CLINIC_OR_DEPARTMENT_OTHER): Payer: Self-pay

## 2018-01-13 DIAGNOSIS — C61 Malignant neoplasm of prostate: Secondary | ICD-10-CM | POA: Diagnosis not present

## 2018-01-13 DIAGNOSIS — Z01818 Encounter for other preprocedural examination: Secondary | ICD-10-CM | POA: Insufficient documentation

## 2018-01-13 DIAGNOSIS — Z0001 Encounter for general adult medical examination with abnormal findings: Secondary | ICD-10-CM | POA: Diagnosis not present

## 2018-01-13 DIAGNOSIS — R7309 Other abnormal glucose: Secondary | ICD-10-CM | POA: Diagnosis not present

## 2018-01-13 DIAGNOSIS — I10 Essential (primary) hypertension: Secondary | ICD-10-CM | POA: Diagnosis not present

## 2018-01-13 DIAGNOSIS — Z1389 Encounter for screening for other disorder: Secondary | ICD-10-CM | POA: Diagnosis not present

## 2018-01-13 DIAGNOSIS — E782 Mixed hyperlipidemia: Secondary | ICD-10-CM | POA: Diagnosis not present

## 2018-01-13 LAB — PROTIME-INR
INR: 0.95
PROTHROMBIN TIME: 12.6 s (ref 11.4–15.2)

## 2018-01-13 LAB — COMPREHENSIVE METABOLIC PANEL
ALK PHOS: 81 U/L (ref 38–126)
ALT: 26 U/L (ref 0–44)
AST: 24 U/L (ref 15–41)
Albumin: 4.5 g/dL (ref 3.5–5.0)
Anion gap: 9 (ref 5–15)
BILIRUBIN TOTAL: 0.8 mg/dL (ref 0.3–1.2)
BUN: 16 mg/dL (ref 8–23)
CALCIUM: 9.4 mg/dL (ref 8.9–10.3)
CO2: 29 mmol/L (ref 22–32)
CREATININE: 0.91 mg/dL (ref 0.61–1.24)
Chloride: 102 mmol/L (ref 98–111)
GFR calc Af Amer: >60 mL/min (ref >60)
GFR calc non Af Amer: >60 mL/min (ref >60)
Glucose, Bld: 118 mg/dL — ABNORMAL HIGH (ref 70–99)
Potassium: 4.2 mmol/L (ref 3.5–5.1)
SODIUM: 140 mmol/L (ref 135–145)
Total Protein: 7.6 g/dL (ref 6.5–8.1)

## 2018-01-13 LAB — CBC
HCT: 48.5 % (ref 39.0–52.0)
Hemoglobin: 16.5 g/dL (ref 13.0–17.0)
MCH: 31.2 pg (ref 26.0–34.0)
MCHC: 34 g/dL (ref 30.0–36.0)
MCV: 91.7 fL (ref 78.0–100.0)
PLATELETS: 171 10*3/uL (ref 150–400)
RBC: 5.29 MIL/uL (ref 4.22–5.81)
RDW: 13.2 % (ref 11.5–15.5)
WBC: 6 10*3/uL (ref 4.0–10.5)

## 2018-01-13 LAB — APTT: aPTT: 32 seconds (ref 24–36)

## 2018-01-13 NOTE — Progress Notes (Signed)
Spoke with: [place] NPO:   No food after midnight/Clear liquids until 8:00am  DOS Arrival time: 12Noon Labs: (EKG, CXR, CBC, CMP, PT, PTT in epic) AM medications: None, Fleet enema Pre op orders: Yes Ride home:   Thayer Headings (wife) [phone]

## 2018-01-13 NOTE — Pre-Procedure Instructions (Signed)
CMP results 01/13/2018 faxed to Dr. Alyson Ingles via epic.

## 2018-01-18 DIAGNOSIS — Z23 Encounter for immunization: Secondary | ICD-10-CM | POA: Diagnosis not present

## 2018-01-19 ENCOUNTER — Telehealth: Payer: Self-pay | Admitting: *Deleted

## 2018-01-19 NOTE — Telephone Encounter (Signed)
CALLED PATIENT TO REMIND OF PROCEDURE FOR 01-20-18, SPOKE WITH PATIENT'S WIFE AND SHE IS AWARE OF THIS PROCEDURE

## 2018-01-20 ENCOUNTER — Ambulatory Visit (HOSPITAL_BASED_OUTPATIENT_CLINIC_OR_DEPARTMENT_OTHER)
Admission: RE | Admit: 2018-01-20 | Discharge: 2018-01-20 | Disposition: A | Discharge status: Home / Self Care | Payer: Medicare Other | Source: Ambulatory Visit | Attending: Urology | Admitting: Urology

## 2018-01-20 ENCOUNTER — Other Ambulatory Visit: Payer: Self-pay

## 2018-01-20 ENCOUNTER — Ambulatory Visit (HOSPITAL_COMMUNITY): Payer: Medicare Other

## 2018-01-20 ENCOUNTER — Ambulatory Visit (HOSPITAL_BASED_OUTPATIENT_CLINIC_OR_DEPARTMENT_OTHER): Payer: Medicare Other | Admitting: Anesthesiology

## 2018-01-20 ENCOUNTER — Encounter (HOSPITAL_BASED_OUTPATIENT_CLINIC_OR_DEPARTMENT_OTHER): Payer: Self-pay | Admitting: Certified Registered"

## 2018-01-20 ENCOUNTER — Encounter (HOSPITAL_BASED_OUTPATIENT_CLINIC_OR_DEPARTMENT_OTHER)
Admission: RE | Disposition: A | Discharge status: Home / Self Care | Payer: Self-pay | Source: Ambulatory Visit | Attending: Urology

## 2018-01-20 DIAGNOSIS — Z87891 Personal history of nicotine dependence: Secondary | ICD-10-CM | POA: Diagnosis not present

## 2018-01-20 DIAGNOSIS — Z8673 Personal history of transient ischemic attack (TIA), and cerebral infarction without residual deficits: Secondary | ICD-10-CM | POA: Diagnosis not present

## 2018-01-20 DIAGNOSIS — C61 Malignant neoplasm of prostate: Secondary | ICD-10-CM | POA: Insufficient documentation

## 2018-01-20 DIAGNOSIS — Z79899 Other long term (current) drug therapy: Secondary | ICD-10-CM | POA: Diagnosis not present

## 2018-01-20 DIAGNOSIS — E1151 Type 2 diabetes mellitus with diabetic peripheral angiopathy without gangrene: Secondary | ICD-10-CM | POA: Insufficient documentation

## 2018-01-20 DIAGNOSIS — G4730 Sleep apnea, unspecified: Secondary | ICD-10-CM | POA: Diagnosis not present

## 2018-01-20 DIAGNOSIS — I10 Essential (primary) hypertension: Secondary | ICD-10-CM | POA: Insufficient documentation

## 2018-01-20 DIAGNOSIS — Z01818 Encounter for other preprocedural examination: Secondary | ICD-10-CM

## 2018-01-20 HISTORY — DX: Inflammatory liver disease, unspecified: K75.9

## 2018-01-20 HISTORY — PX: RADIOACTIVE SEED IMPLANT: SHX5150

## 2018-01-20 HISTORY — DX: Personal history of colon polyps, unspecified: Z86.0100

## 2018-01-20 HISTORY — PX: CYSTOSCOPY: SHX5120

## 2018-01-20 HISTORY — DX: Residual hemorrhoidal skin tags: K64.4

## 2018-01-20 HISTORY — DX: Personal history of colonic polyps: Z86.010

## 2018-01-20 HISTORY — DX: Spondylosis without myelopathy or radiculopathy, cervical region: M47.812

## 2018-01-20 HISTORY — DX: Solitary pulmonary nodule: R91.1

## 2018-01-20 HISTORY — DX: Other enthesopathies, not elsewhere classified: M77.8

## 2018-01-20 HISTORY — PX: SPACE OAR INSTILLATION: SHX6769

## 2018-01-20 HISTORY — DX: Other specified diseases of liver: K76.89

## 2018-01-20 HISTORY — DX: Other shoulder lesions, right shoulder: M75.81

## 2018-01-20 HISTORY — DX: Spinal stenosis, cervical region: M48.02

## 2018-01-20 HISTORY — DX: Atherosclerosis of aorta: I70.0

## 2018-01-20 LAB — GLUCOSE, CAPILLARY
GLUCOSE-CAPILLARY: 109 mg/dL — AB (ref 70–99)
Glucose-Capillary: 109 mg/dL — ABNORMAL HIGH (ref 70–99)

## 2018-01-20 SURGERY — INSERTION, RADIATION SOURCE, PROSTATE
Anesthesia: General | Site: Rectum

## 2018-01-20 MED ORDER — MEPERIDINE HCL 25 MG/ML IJ SOLN
6.2500 mg | INTRAMUSCULAR | Status: DC | PRN
  Filled 2018-01-20: qty 1

## 2018-01-20 MED ORDER — DEXAMETHASONE SODIUM PHOSPHATE 10 MG/ML IJ SOLN
INTRAMUSCULAR | Status: DC | PRN
  Administered 2018-01-20: 10 mg via INTRAVENOUS

## 2018-01-20 MED ORDER — EPHEDRINE SULFATE-NACL 50-0.9 MG/10ML-% IV SOSY
PREFILLED_SYRINGE | INTRAVENOUS | Status: DC | PRN
  Administered 2018-01-20 (×2): 10 mg via INTRAVENOUS

## 2018-01-20 MED ORDER — SODIUM CHLORIDE FLUSH 0.9 % IV SOLN
INTRAVENOUS | Status: DC | PRN
  Administered 2018-01-20: 10 mL via INTRAVENOUS

## 2018-01-20 MED ORDER — GENTAMICIN SULFATE 40 MG/ML IJ SOLN
5.0000 mg/kg | INTRAVENOUS | Status: AC
  Administered 2018-01-20: 380 mg via INTRAVENOUS
  Filled 2018-01-20 (×2): qty 9.5

## 2018-01-20 MED ORDER — FLEET ENEMA 7-19 GM/118ML RE ENEM
1.0000 | ENEMA | Freq: Once | RECTAL | Status: DC
  Filled 2018-01-20: qty 1

## 2018-01-20 MED ORDER — PROPOFOL 10 MG/ML IV BOLUS
INTRAVENOUS | Status: DC | PRN
  Administered 2018-01-20: 150 mg via INTRAVENOUS

## 2018-01-20 MED ORDER — LACTATED RINGERS IV SOLN
INTRAVENOUS | Status: DC
  Administered 2018-01-20 (×2): via INTRAVENOUS
  Filled 2018-01-20: qty 1000

## 2018-01-20 MED ORDER — LIDOCAINE 2% (20 MG/ML) 5 ML SYRINGE
INTRAMUSCULAR | Status: AC
  Filled 2018-01-20: qty 5

## 2018-01-20 MED ORDER — FENTANYL CITRATE (PF) 100 MCG/2ML IJ SOLN
INTRAMUSCULAR | Status: AC
  Filled 2018-01-20: qty 2

## 2018-01-20 MED ORDER — FENTANYL CITRATE (PF) 100 MCG/2ML IJ SOLN
INTRAMUSCULAR | Status: DC | PRN
  Administered 2018-01-20: 50 ug via INTRAVENOUS
  Administered 2018-01-20 (×3): 25 ug via INTRAVENOUS

## 2018-01-20 MED ORDER — IOHEXOL 300 MG/ML  SOLN
INTRAMUSCULAR | Status: DC | PRN
  Administered 2018-01-20: 7 mL

## 2018-01-20 MED ORDER — FENTANYL CITRATE (PF) 100 MCG/2ML IJ SOLN
25.0000 ug | INTRAMUSCULAR | Status: DC | PRN
  Filled 2018-01-20: qty 1

## 2018-01-20 MED ORDER — EPHEDRINE 5 MG/ML INJ
INTRAVENOUS | Status: AC
  Filled 2018-01-20: qty 10

## 2018-01-20 MED ORDER — GENTAMICIN SULFATE 40 MG/ML IJ SOLN
5.0000 mg/kg | Freq: Once | INTRAVENOUS | Status: DC
  Filled 2018-01-20: qty 9.75

## 2018-01-20 MED ORDER — PROPOFOL 10 MG/ML IV BOLUS
INTRAVENOUS | Status: AC
  Filled 2018-01-20: qty 20

## 2018-01-20 MED ORDER — SODIUM CHLORIDE 0.9 % IR SOLN
Status: DC | PRN
  Administered 2018-01-20: 1000 mL

## 2018-01-20 MED ORDER — ONDANSETRON HCL 4 MG/2ML IJ SOLN
INTRAMUSCULAR | Status: DC | PRN
  Administered 2018-01-20: 4 mg via INTRAVENOUS

## 2018-01-20 MED ORDER — ONDANSETRON HCL 4 MG/2ML IJ SOLN
4.0000 mg | Freq: Once | INTRAMUSCULAR | Status: DC | PRN
  Filled 2018-01-20: qty 2

## 2018-01-20 MED ORDER — LIDOCAINE 2% (20 MG/ML) 5 ML SYRINGE
INTRAMUSCULAR | Status: DC | PRN
  Administered 2018-01-20: 60 mg via INTRAVENOUS

## 2018-01-20 MED ORDER — TRAMADOL HCL 50 MG PO TABS: 50.0000 mg | ORAL_TABLET | Freq: Four times a day (QID) | ORAL | 0 refills | Status: AC | PRN

## 2018-01-20 SURGICAL SUPPLY — 41 items
BAG URINE DRAINAGE (UROLOGICAL SUPPLIES) ×5 IMPLANT
BLADE CLIPPER SURG (BLADE) ×5 IMPLANT
CATH FOLEY 2WAY SLVR  5CC 16FR (CATHETERS) ×4
CATH FOLEY 2WAY SLVR 5CC 16FR (CATHETERS) ×6 IMPLANT
CATH ROBINSON RED A/P 20FR (CATHETERS) ×5 IMPLANT
CLOTH BEACON ORANGE TIMEOUT ST (SAFETY) ×5 IMPLANT
CONT SPECI 4OZ STER CLIK (MISCELLANEOUS) ×10 IMPLANT
COVER BACK TABLE 60X90IN (DRAPES) ×5 IMPLANT
COVER MAYO STAND STRL (DRAPES) ×5 IMPLANT
DRSG TEGADERM 4X4.75 (GAUZE/BANDAGES/DRESSINGS) ×5 IMPLANT
DRSG TEGADERM 8X12 (GAUZE/BANDAGES/DRESSINGS) ×10 IMPLANT
GAUZE SPONGE 4X4 12PLY STRL (GAUZE/BANDAGES/DRESSINGS) ×5 IMPLANT
GLOVE BIO SURGEON STRL SZ 6 (GLOVE) IMPLANT
GLOVE BIO SURGEON STRL SZ7 (GLOVE) IMPLANT
GLOVE BIO SURGEON STRL SZ8 (GLOVE) ×5 IMPLANT
GLOVE BIOGEL PI IND STRL 6 (GLOVE) IMPLANT
GLOVE BIOGEL PI IND STRL 6.5 (GLOVE) ×3 IMPLANT
GLOVE BIOGEL PI IND STRL 8 (GLOVE) IMPLANT
GLOVE BIOGEL PI INDICATOR 6 (GLOVE)
GLOVE BIOGEL PI INDICATOR 6.5 (GLOVE) ×2
GLOVE BIOGEL PI INDICATOR 8 (GLOVE)
GLOVE ECLIPSE 8.0 STRL XLNG CF (GLOVE) ×5 IMPLANT
GLOVE INDICATOR 6.5 STRL GRN (GLOVE) ×5 IMPLANT
GLOVE INDICATOR 7.0 STRL GRN (GLOVE) IMPLANT
GOWN STRL REUS W/TWL XL LVL3 (GOWN DISPOSABLE) ×5 IMPLANT
HOLDER FOLEY CATH W/STRAP (MISCELLANEOUS) ×5 IMPLANT
I-SEED AGX100 ×440 IMPLANT
IMPL SPACEOAR SYSTEM 10ML (MISCELLANEOUS) IMPLANT
IMPLANT SPACEOAR SYSTEM 10ML (MISCELLANEOUS)
IV NS 1000ML (IV SOLUTION) ×2
IV NS 1000ML BAXH (IV SOLUTION) ×3 IMPLANT
KIT TURNOVER CYSTO (KITS) ×5 IMPLANT
MANIFOLD NEPTUNE II (INSTRUMENTS) IMPLANT
MARKER SKIN DUAL TIP RULER LAB (MISCELLANEOUS) ×5 IMPLANT
PACK CYSTO (CUSTOM PROCEDURE TRAY) ×5 IMPLANT
SURGILUBE 2OZ TUBE FLIPTOP (MISCELLANEOUS) ×5 IMPLANT
SYR 10ML LL (SYRINGE) ×10 IMPLANT
TOWEL OR 17X24 6PK STRL BLUE (TOWEL DISPOSABLE) ×10 IMPLANT
UNDERPAD 30X30 (UNDERPADS AND DIAPERS) ×10 IMPLANT
WATER STERILE IRR 3000ML UROMA (IV SOLUTION) ×5 IMPLANT
WATER STERILE IRR 500ML POUR (IV SOLUTION) ×5 IMPLANT

## 2018-01-20 NOTE — Anesthesia Procedure Notes (Signed)
Procedure Name: LMA Insertion Date/Time: 01/20/2018 2:15 PM Performed by: Suan Halter, CRNA Pre-anesthesia Checklist: Patient identified, Emergency Drugs available, Suction available and Patient being monitored Patient Re-evaluated:Patient Re-evaluated prior to induction Oxygen Delivery Method: Circle system utilized Preoxygenation: Pre-oxygenation with 100% oxygen Induction Type: IV induction Ventilation: Mask ventilation without difficulty LMA: LMA inserted LMA Size: 4.0 Number of attempts: 1 Airway Equipment and Method: Bite block Placement Confirmation: positive ETCO2 Tube secured with: Tape Dental Injury: Teeth and Oropharynx as per pre-operative assessment

## 2018-01-20 NOTE — Anesthesia Preprocedure Evaluation (Signed)
Anesthesia Evaluation  Patient identified by MRN, date of birth, ID band Patient awake    Reviewed: Allergy & Precautions, NPO status , Patient's Chart, lab work & pertinent test results  Airway Mallampati: II       Dental  (+) Teeth Intact   Pulmonary neg pulmonary ROS, neg sleep apnea (denies snoring, tiredness), former smoker,    breath sounds clear to auscultation       Cardiovascular hypertension, Pt. on medications + Peripheral Vascular Disease   Rhythm:Regular Rate:Normal     Neuro/Psych PSYCHIATRIC DISORDERS Anxiety CVA negative neurological ROS  negative psych ROS   GI/Hepatic negative GI ROS, neg GERD  ,(+) Hepatitis -  Endo/Other  diabetes  Renal/GU negative Renal ROS  negative genitourinary   Musculoskeletal negative musculoskeletal ROS (+) Arthritis ,   Abdominal   Peds negative pediatric ROS (+)  Hematology negative hematology ROS (+)   Anesthesia Other Findings   Reproductive/Obstetrics negative OB ROS                             Anesthesia Physical  Anesthesia Plan  ASA: III  Anesthesia Plan: General   Post-op Pain Management:    Induction: Intravenous  PONV Risk Score and Plan: 3 and Ondansetron, Dexamethasone and Treatment may vary due to age or medical condition  Airway Management Planned: LMA  Additional Equipment:   Intra-op Plan:   Post-operative Plan: Extubation in OR  Informed Consent: I have reviewed the patients History and Physical, chart, labs and discussed the procedure including the risks, benefits and alternatives for the proposed anesthesia with the patient or authorized representative who has indicated his/her understanding and acceptance.   Dental advisory given  Plan Discussed with: CRNA  Anesthesia Plan Comments:         Anesthesia Quick Evaluation

## 2018-01-20 NOTE — Op Note (Signed)
PRE-OPERATIVE DIAGNOSIS:  Adenocarcinoma of the prostate  POST-OPERATIVE DIAGNOSIS:  Same  PROCEDURE:  Procedure(s): 1. I-125 radioactive seed implantation 2. Cystoscopy 3. Placement of SpaceOAR  SURGEON:  Surgeon(s): Nicolette Bang, MD  Radiation oncologist: Tyler Pita, MD  ANESTHESIA:  General  EBL:  Minimal  DRAINS: 25 French Foley catheter  INDICATION: Robert Zhang is a 72 year old with a history of T1c prostate cancer. After discussing treatment options he has elected to proceed with brachytherapy  Description of procedure: After informed consent the patient was brought to the major OR, placed on the table and administered general anesthesia. He was then moved to the modified lithotomy position with his perineum perpendicular to the floor. His perineum and genitalia were then sterilely prepped. An official timeout was then performed. A 16 French Foley catheter was then placed in the bladder and filled with dilute contrast, a rectal tube was placed in the rectum and the transrectal ultrasound probe was placed in the rectum and affixed to the stand. He was then sterilely draped.  Real time ultrasonography was used along with the seed planning software [employer] vs. 4.2.21. This was used to develop the seed plan including the number of needles as well as number of seeds required for complete and adequate coverage. Real-time ultrasonography was then used along with the previously developed plan and the Nucletron device to implant a total of 88 seeds using 32 needles. This proceeded without difficulty or complication.  We then proceeded to mix the SpaceOAR using the kit supplied from the manufacturer. Once this was complete we placed a sinal needle into the perirectal fat between the rectum and the prostate. Once this was accomplished we injected 2cc of normal saline to hydrodissect the plain. We then instilled the the SpaceOAR through the spinal needle and noted good  distribution in the perirectal fat.   A Foley catheter was then removed as well as the transrectal ultrasound probe and rectal probe. Flexible cystoscopy was then performed using the 17 French flexible scope which revealed a normal urethra throughout its length down to the sphincter which appeared intact. The prostatic urethra revealed bilobar hypertrophy but no evidence of obstruction, seeds, spacers or lesions. The bladder was then entered and fully and systematically inspected. The ureteral orifices were noted to be of normal configuration and position. The mucosa revealed no evidence of tumors. There were also no stones identified within the bladder. I noted no seeds or spacers on the floor of the bladder and retroflexion of the scope revealed no seeds protruding from the base of the prostate.  The cystoscope was then removed and a new 37 French Foley catheter was then inserted and the balloon was filled with 10 cc of sterile water. This was connected to closed system drainage and the patient was awakened and taken to recovery room in stable and satisfactory condition. He tolerated procedure well and there were no intraoperative complications.

## 2018-01-20 NOTE — H&P (Signed)
Urology Admission H&P  Chief Complaint: prostate cancer  History of Present Illness: [name] is a 72yo with prostate cancer her for brachytherapy with SpaceOAR. He denies any significant LUTS. No nausea/vomiting. No fevers/chills/sweats  Past Medical History:  Diagnosis Date  . Aortic atherosclerosis ([place]) 10/12/2017   Noted on CT   . Arthritis    bilateral legs /   Neck-Degenerative Disc Disease  . Carotid artery occlusion    Right Carotid   . Cervical spondylosis   . Cervical stenosis of spine   . Degenerative disc disease, cervical    C4-7  . Diabetes mellitus without complication ([place])    diet and exercise controlled, no medications, hgb A1C 07/31/2017 (6.9)  . External hemorrhoids   . Hepatic cyst 10/12/2017   Low density lession right hepatic lobe, noted on CT  . Hepatitis    Hepatitis C negative 12/05/2014  . History of colon polyps   . Hypertension   . Leg cramps   . Prostate CA ([place])   . Protrusion of intervertebral disc of lumbosacral region    L5-S1  . PTSD (post-traumatic stress disorder)   . PTSD (post-traumatic stress disorder)   . Pulmonary nodule 11/26/2016   76mm subpleural right lower lobe, noted on CT ABD/PELVIS  . Right shoulder tendinitis   . Sleep apnea    Stop Bang score of 5  . Stroke [facility]) 2016   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   Past Surgical History:  Procedure Laterality Date  . CATARACT EXTRACTION W/PHACO  07/20/2011   Procedure: CATARACT EXTRACTION PHACO AND INTRAOCULAR LENS PLACEMENT (IOC);  Surgeon: Williams Che, MD;  Location: [facility] ORS;  Service: Ophthalmology;  Laterality: Right;  CDE=25.73  . CATARACT EXTRACTION W/PHACO  08/31/2011   Procedure: CATARACT EXTRACTION PHACO AND INTRAOCULAR LENS PLACEMENT (IOC);  Surgeon: Williams Che, MD;  Location: [facility] ORS;  Service: Ophthalmology;  Laterality: Left;  CDE: 38.59  . COLONOSCOPY N/A 09/10/2016   Procedure: COLONOSCOPY;  Surgeon: Rogene Houston, MD;  Location: [facility] ENDO SUITE;   Service: Endoscopy;  Laterality: N/A;  1200  . COLONOSCOPY W/ POLYPECTOMY  2011   [facility]-Dr. Rehman  . cortisone injection Left 01/13/2016   left shoulder  . cortisone injection Left 09/21/2017   left shoulder  . EYE SURGERY     laser surgery to repair retina tare  . head reconstruction  1966   due to head injury in war  . PROSTATE BIOPSY  05/20/2016  . REFRACTIVE SURGERY Left 03/05/2017   to resolve cloudiness  . TRIGGER FINGER RELEASE      Home Medications:  Current Facility-Administered Medications  Medication Dose Route Frequency Provider Last Rate Last Dose  . gentamicin (GARAMYCIN) 380 mg in dextrose 5 % 100 mL IVPB  5 mg/kg Intravenous On Call to OR Cleon Gustin, MD      . iohexol (OMNIPAQUE) 300 MG/ML solution    PRN Cleon Gustin, MD   7 mL at 01/20/18 1256  . lactated ringers infusion   Intravenous Continuous Ellender, Karyl Kinnier, MD 50 mL/hr at 01/20/18 1227    . sodium chloride flush 0.9 % injection    PRN Cleon Gustin, MD   10 mL at 01/20/18 1257  . sodium chloride irrigation 0.9 %    PRN Cleon Gustin, MD   1,000 mL at 01/20/18 1258  . [START ON 01/21/2018] sodium phosphate (FLEET) 7-19 GM/118ML enema 1 enema  1 enema Rectal Once Baylie Drakes,  Candee Furbish, MD       Allergies:  Allergies  Allergen Reactions  . Penicillins Rash and Other (See Comments)    Childhood allergy Has patient had a PCN reaction causing immediate rash, facial/tongue/throat swelling, SOB or lightheadedness with hypotension: yes Has patient had a PCN reaction causing severe rash involving mucus membranes or skin necrosis: no Has patient had a PCN reaction that required hospitalization no Has patient had a PCN reaction occurring within the last 10 years: no If all of the above answers are "NO", then may proceed with Cephalosporin use.      Family History  Problem Relation Age of Onset  . Diabetes Mother   . Hypertension Father   . Heart disease Father         before age 26  . Heart attack Father   . Pancreatic cancer Brother   . Anesthesia problems Neg Hx   . Hypotension Neg Hx   . Malignant hyperthermia Neg Hx   . Pseudochol deficiency Neg Hx    Social History:  reports that he quit smoking about 41 years ago. His smoking use included cigarettes. He has a 13.00 pack-year smoking history. He has never used smokeless tobacco. He reports that he drinks about 4.0 - 6.0 standard drinks of alcohol per week. He reports that he does not use drugs.  Review of Systems  All other systems reviewed and are negative.   Physical Exam:  Vital signs in last 24 hours: Temp:  [97.4 F (36.3 C)] 97.4 F (36.3 C) (09/05 1200) Pulse Rate:  [87] 87 (09/05 1200) Resp:  [16] 16 (09/05 1200) BP: (185)/(96) 185/96 (09/05 1200) SpO2:  [98 %] 98 % (09/05 1200) Weight:  [76 kg] 76 kg (09/05 1200) Physical Exam  Constitutional: He is oriented to person, place, and time. He appears well-developed and well-nourished.  HENT:  Head: Normocephalic and atraumatic.  Eyes: Pupils are equal, round, and reactive to light. EOM are normal.  Neck: Normal range of motion. No thyromegaly present.  Cardiovascular: Normal rate and regular rhythm.  Respiratory: Effort normal. No respiratory distress.  GI: Soft. He exhibits no distension.  Musculoskeletal: Normal range of motion. He exhibits no edema.  Neurological: He is alert and oriented to person, place, and time.  Skin: Skin is warm and dry.  Psychiatric: He has a normal mood and affect. His behavior is normal. Judgment and thought content normal.    Laboratory Data:  Results for orders placed or performed during the hospital encounter of 01/20/18 (from the past 24 hour(s))  Glucose, capillary     Status: Abnormal   Collection Time: 01/20/18 12:09 PM  Result Value Ref Range   Glucose-Capillary 109 (H) 70 - 99 mg/dL   No results found for this or any previous visit (from the past 240 hour(s)). Creatinine: No results  for input(s): CREATININE in the last 168 hours. Baseline Creatinine: unknown  Impression/Assessment:  71yo with prostate cancer  Plan:  The risks/benefits/alternatives to brachytherapy with SpaceOAR was explained to the patient and he understands and wishes to proceed with surgery  Nicolette Bang 01/20/2018, 2:04 PM

## 2018-01-20 NOTE — Discharge Instructions (Signed)
Post Anesthesia Home Care Instructions  Activity: Get plenty of rest for the remainder of the day. A responsible adult should stay with you for 24 hours following the procedure.  For the next 24 hours, DO NOT: -Drive a car -Paediatric nurse -Drink alcoholic beverages -Take any medication unless instructed by your physician -Make any legal decisions or sign important papers.  Meals: Start with liquid foods such as gelatin or soup. Progress to regular foods as tolerated. Avoid greasy, spicy, heavy foods. If nausea and/or vomiting occur, drink only clear liquids until the nausea and/or vomiting subsides. Call your physician if vomiting continues.  Special Instructions/Symptoms: Your throat may feel dry or sore from the anesthesia or the breathing tube placed in your throat during surgery. If this causes discomfort, gargle with warm salt water. The discomfort should disappear within 24 hours.  If you had a scopolamine patch placed behind your ear for the management of post- operative nausea and/or vomiting:  1. The medication in the patch is effective for 72 hours, after which it should be removed.  Wrap patch in a tissue and discard in the trash. Wash hands thoroughly with soap and water. 2. You may remove the patch earlier than 72 hours if you experience unpleasant side effects which may include dry mouth, dizziness or visual disturbances. 3. Avoid touching the patch. Wash your hands with soap and water after contact with the patch.     Indwelling Urinary Catheter Care, Adult Take good care of your catheter to keep it working and to prevent problems. How to wear your catheter Attach your catheter to your leg with tape (adhesive tape) or a leg strap. Make sure it is not too tight. If you use tape, remove any bits of tape that are already on the catheter. How to wear a drainage bag You should have:  A large overnight bag.  A small leg bag.  Overnight Bag You may wear the  overnight bag at any time. Always keep the bag below the level of your bladder but off the floor. When you sleep, put a clean plastic bag in a wastebasket. Then hang the bag inside the wastebasket. Leg Bag Never wear the leg bag at night. Always wear the leg bag below your knee. Keep the leg bag secure with a leg strap or tape. How to care for your skin  Clean the skin around the catheter at least once every day.  Shower every day. Do not take baths.  Put creams, lotions, or ointments on your genital area only as told by your doctor.  Do not use powders, sprays, or lotions on your genital area. How to clean your catheter and your skin 1. Wash your hands with soap and water. 2. Wet a washcloth in warm water and gentle (mild) soap. 3. Use the washcloth to clean the skin where the catheter enters your body. Clean downward and wipe away from the catheter in small circles. Do not wipe toward the catheter. 4. Pat the area dry with a clean towel. Make sure to clean off all soap. How to care for your drainage bags Empty your drainage bag when it is ?- full or at least 2-3 times a day. Replace your drainage bag once a month or sooner if it starts to smell bad or look dirty. Do not clean your drainage bag unless told by your doctor. Emptying a drainage bag  Supplies Needed  Rubbing alcohol.  Gauze pad or cotton ball.  Tape or a leg  strap.  Steps 1. Wash your hands with soap and water. 2. Separate (detach) the bag from your leg. 3. Hold the bag over the toilet or a clean container. Keep the bag below your hips and bladder. This stops pee (urine) from going back into the tube. 4. Open the pour spout at the bottom of the bag. 5. Empty the pee into the toilet or container. Do not let the pour spout touch any surface. 6. Put rubbing alcohol on a gauze pad or cotton ball. 7. Use the gauze pad or cotton ball to clean the pour spout. 8. Close the pour spout. 9. Attach the bag to your leg with  tape or a leg strap. 10. Wash your hands.  Changing a drainage bag Supplies Needed  Alcohol wipes.  A clean drainage bag.  Adhesive tape or a leg strap.  Steps 1. Wash your hands with soap and water. 2. Separate the dirty bag from your leg. 3. Pinch the rubber catheter with your fingers so that pee does not spill out. 4. Separate the catheter tube from the drainage tube where these tubes connect (at the connection valve). Do not let the tubes touch any surface. 5. Clean the end of the catheter tube with an alcohol wipe. Use a different alcohol wipe to clean the end of the drainage tube. 6. Connect the catheter tube to the drainage tube of the clean bag. 7. Attach the new bag to the leg with adhesive tape or a leg strap. 8. Wash your hands.  How to prevent infection and other problems  Never pull on your catheter or try to remove it. Pulling can damage tissue in your body.  Always wash your hands before and after touching your catheter.  If a leg strap gets wet, replace it with a dry one.  Drink enough fluids to keep your pee clear or pale yellow, or as told by your doctor.  Do not let the drainage bag or tubing touch the floor.  Wear cotton underwear.  If you are male, wipe from front to back after you poop (have a bowel movement).  Check on the catheter often to make sure it works and the tubing is not twisted. Get help if:  Your pee is cloudy.  Your pee smells unusually bad.  Your pee is not draining into the bag.  Your tube gets clogged.  Your catheter starts to leak.  Your bladder feels full. Get help right away if:  You have redness, swelling, or pain where the catheter enters your body.  You have fluid, pus, or a bad smell coming from the area where the catheter enters your body.  The area where the catheter enters your body feels warm.  You have a fever.  You have pain in your: ? Stomach (abdomen). ? Legs. ? Lower back. ? Bladder.  You see  blood fill the catheter.  Your pee is pink or red.  You feel sick to your stomach (nauseous).  You throw up (vomit).  You have chills.  Your catheter gets pulled out. This information is not intended to replace advice given to you by your health care provider. Make sure you discuss any questions you have with your health care provider. Document Released: 08/29/2012 Document Revised: 04/01/2016 Document Reviewed: 10/17/2013 Elsevier Interactive Patient Education  [employer].

## 2018-01-20 NOTE — Transfer of Care (Signed)
Immediate Anesthesia Transfer of Care Note  Patient: Claudia Pollock  Procedure(s) Performed: Procedure(s) (LRB): RADIOACTIVE SEED IMPLANT/BRACHYTHERAPY IMPLANT (N/A) SPACE OAR INSTILLATION (N/A) CYSTOSCOPY FLEXIBLE  Patient Location: PACU  Anesthesia Type: General  Level of Consciousness: awake, oriented, sedated and patient cooperative  Airway & Oxygen Therapy: Patient Spontanous Breathing and Patient connected to face mask oxygen  Post-op Assessment: Report given to PACU RN and Post -op Vital signs reviewed and stable  Post vital signs: Reviewed and stable  Complications: No apparent anesthesia complications  Last Vitals:  Vitals Value Taken Time  BP    Temp    Pulse 89 01/20/2018  3:36 PM  Resp 16 01/20/2018  3:36 PM  SpO2 100 % 01/20/2018  3:36 PM  Vitals shown include unvalidated device data.  Last Pain:  Vitals:   01/20/18 1200  TempSrc: Oral  PainSc: 4

## 2018-01-21 ENCOUNTER — Encounter (HOSPITAL_BASED_OUTPATIENT_CLINIC_OR_DEPARTMENT_OTHER): Payer: Self-pay | Admitting: Urology

## 2018-01-22 NOTE — Progress Notes (Signed)
  Radiation Oncology         [phone] ________________________________  Name: Robert Zhang MRN: [id number]  Date: 01/22/2018  DOB: 10-09-1945       Prostate Seed Implant  EL:[id number], Jenny Reichmann, MD  No ref. provider found  DIAGNOSIS: .72 y.o. gentleman with Stage T1c adenocarcinoma of the prostate with Gleason Score of 3+3, and PSA of 10.1    ICD-10-CM   1. Pre-op testing Z01.818 DG Chest 2 View    DG Chest 2 View    PROCEDURE: Insertion of radioactive I-125 seeds into the prostate gland.  RADIATION DOSE: 145 Gy, definitive therapy.  TECHNIQUE: [name] was brought to the operating room with the urologist. He was placed in the dorsolithotomy position. He was catheterized and a rectal tube was inserted. The perineum was shaved, prepped and draped. The ultrasound probe was then introduced into the rectum to see the prostate gland.  TREATMENT DEVICE: A needle grid was attached to the ultrasound probe stand and anchor needles were placed.  3D PLANNING: The prostate was imaged in 3D using a sagittal sweep of the prostate probe. These images were transferred to the planning computer. There, the prostate, urethra and rectum were defined on each axial reconstructed image. Then, the software created an optimized 3D plan and a few seed positions were adjusted. The quality of the plan was reviewed using [facility] information for the target and the following two organs at risk:  Urethra and Rectum.  Then the accepted plan was printed and handed off to the radiation therapist.  Under my supervision, the custom loading of the seeds and spacers was carried out and loaded into sealed vicryl sleeves.  These pre-loaded needles were then placed into the needle holder.Marland Kitchen  PROSTATE VOLUME STUDY:  Using transrectal ultrasound the volume of the prostate was verified to be 43.9 cc.  SPECIAL TREATMENT PROCEDURE/SUPERVISION AND HANDLING: The pre-loaded needles were then delivered under sagittal guidance. A total of 32  needles were used to deposit 88 seeds in the prostate gland. The individual seed activity was 0.382 mCi.  SpaceOAR:  Yes  COMPLEX SIMULATION: At the end of the procedure, an anterior radiograph of the pelvis was obtained to document seed positioning and count. Cystoscopy was performed to check the urethra and bladder.  MICRODOSIMETRY: At the end of the procedure, the patient was emitting 0.075 mR/hr at 1 meter. Accordingly, he was considered safe for hospital discharge.  PLAN: The patient will return to the radiation oncology clinic for post implant CT dosimetry in three weeks.   ________________________________  Sheral Apley Tammi Klippel, M.D.

## 2018-01-24 NOTE — Anesthesia Postprocedure Evaluation (Signed)
Anesthesia Post Note  Patient: Robert Zhang  Procedure(s) Performed: RADIOACTIVE SEED IMPLANT/BRACHYTHERAPY IMPLANT (N/A Prostate) SPACE OAR INSTILLATION (N/A Rectum) CYSTOSCOPY FLEXIBLE (Bladder)     Patient location during evaluation: PACU Anesthesia Type: General Level of consciousness: awake and alert Pain management: pain level controlled Vital Signs Assessment: post-procedure vital signs reviewed and stable Respiratory status: spontaneous breathing, nonlabored ventilation, respiratory function stable and patient connected to nasal cannula oxygen Cardiovascular status: blood pressure returned to baseline and stable Postop Assessment: no apparent nausea or vomiting Anesthetic complications: no    Last Vitals:  Vitals:   01/20/18 1600 01/20/18 1730  BP: (!) 159/78 (!) 157/92  Pulse: (!) 130 86  Resp: 15 16  Temp:  36.6 C  SpO2: 98% 96%    Last Pain:  Vitals:   01/21/18 1329  TempSrc:   PainSc: 0-No pain                 Hadlei Stitt S

## 2018-01-26 ENCOUNTER — Ambulatory Visit (INDEPENDENT_AMBULATORY_CARE_PROVIDER_SITE_OTHER): Payer: Medicare Other | Admitting: Urology

## 2018-01-26 DIAGNOSIS — N401 Enlarged prostate with lower urinary tract symptoms: Secondary | ICD-10-CM | POA: Diagnosis not present

## 2018-01-28 ENCOUNTER — Ambulatory Visit (INDEPENDENT_AMBULATORY_CARE_PROVIDER_SITE_OTHER): Payer: Medicare Other | Admitting: Urology

## 2018-01-28 DIAGNOSIS — N401 Enlarged prostate with lower urinary tract symptoms: Secondary | ICD-10-CM | POA: Diagnosis not present

## 2018-02-04 ENCOUNTER — Other Ambulatory Visit: Payer: Self-pay

## 2018-02-04 ENCOUNTER — Ambulatory Visit
Admission: RE | Admit: 2018-02-04 | Discharge: 2018-02-04 | Disposition: A | Discharge status: Home / Self Care | Payer: Medicare Other | Source: Ambulatory Visit | Attending: Radiation Oncology | Admitting: Radiation Oncology

## 2018-02-04 ENCOUNTER — Encounter: Payer: Self-pay | Admitting: Radiation Oncology

## 2018-02-04 VITALS — BP 163/79 | HR 99 | Temp 98.0°F | Resp 20

## 2018-02-04 DIAGNOSIS — C61 Malignant neoplasm of prostate: Secondary | ICD-10-CM

## 2018-02-04 NOTE — Progress Notes (Signed)
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 Malignant neoplasm of prostate ([place]) C61     Interval Since Last Radiation:  2 weeks  01/20/18: Radioactive Seed Implant  Narrative:  The patient returns today for routine follow-up.  He is complaining of increased urinary frequency and urinary hesitation symptoms. He filled out a questionnaire regarding urinary function today providing and overall IPSS score of 23 characterizing his symptoms as severe.  His pre-implant score was 11. He denies hematuria, rectal bleeding, and diarrhea or constipation. He reports resolved burning, frequency and urgency, intermittent stream, and having to wait for complete bladder emptying. He began taking flomax 3 days ago.  ALLERGIES:  is allergic to penicillins.  Meds: Current Outpatient Medications  Medication Sig Dispense Refill  . aspirin 81 MG tablet Take 1 tablet (81 mg total) by mouth daily. 30 tablet   . Cholecalciferol (VITAMIN D3) 5000 UNITS CAPS Take 1 capsule by mouth daily.    . hydrochlorothiazide (HYDRODIURIL) 12.5 MG tablet Take by mouth.    . Lancets (ONETOUCH ULTRASOFT) lancets     . losartan (COZAAR) 50 MG tablet Take 50 mg by mouth daily.    . naproxen (NAPROSYN) 500 MG tablet Take 250 mg by mouth daily.     . ONE TOUCH ULTRA TEST test strip     . potassium chloride SA (K-DUR,KLOR-CON) 20 MEQ tablet Take 20 mEq by mouth daily as needed (cramping).     . tamsulosin (FLOMAX) 0.4 MG CAPS capsule Take 0.4 mg by mouth at bedtime.  11  . traMADol (ULTRAM) 50 MG tablet Take 1 tablet (50 mg total) by mouth every 6 (six) hours as needed for moderate pain. (Patient not taking: Reported on  02/04/2018) 30 tablet 0   No current facility-administered medications for this encounter.     Physical Findings: The patient is in no acute distress. Patient is alert and oriented.  vitals were not taken for this visit. .  No significant changes.  Lab Findings: Lab Results  Component Value Date   WBC 6.0 01/13/2018   HGB 16.5 01/13/2018   HCT 48.5 01/13/2018   MCV 91.7 01/13/2018   PLT 171 01/13/2018    Radiographic Findings:  Patient underwent CT imaging in our clinic for post implant dosimetry. The CT appears to demonstrate an adequate distribution of radioactive seeds throughout the prostate gland. There no seeds in her near the rectum. I suspect the final radiation plan and dosimetry will show appropriate coverage of the prostate gland.   Impression: The patient is recovering from the effects of radiation. His urinary symptoms should gradually improve over the next 4-6 months. We talked about this today. He is encouraged by his improvement already and is otherwise please with his outcome.   Plan: Today, I spent time talking to the patient about his prostate seed implant and resolving urinary symptoms. We also talked about long-term follow-up for prostate cancer following seed implant. He understands that ongoing PSA determinations and digital rectal exams will help perform surveillance to rule out disease recurrence. He understands what to expect with his PSA measures. Patient was also educated today about some of the long-term effects  from radiation including a small risk for rectal bleeding and possibly erectile dysfunction. We talked about some of the general management approaches to these potential complications. However, I did encourage the patient to contact our office or return at any point if he has questions or concerns related to his previous radiation and prostate cancer.  He is scheduled for MRI on 02/08/18 and follow up with urology on  02/09/18.  _____________________________________  Sheral Apley. Tammi Klippel, M.D.  This document serves as a record of services personally performed by Tyler Pita, MD. It was created on his behalf by Wilburn Mylar, a trained medical scribe. The creation of this record is based on the scribe's personal observations and the provider's statements to them. This document has been checked and approved by the attending provider.

## 2018-02-04 NOTE — Progress Notes (Signed)
Pt presents today for post seed visit with Dr. Tammi Klippel. Pt is very pleased with how procedure and post-procedure has gone. Pt endorsed burning with urination with and after catheter placement but that has resolved at this time. Pt denies hematuria. Pt denies rectal bleeding or  diarrhea/constipation. Pt reports having more frequency and urgency with urination. Pt states that he had urodynamics last week and that he only had 2cc of residual urine. Pt reports urine stream stops/starts and has to wait for complete bladder emptying.   Pt has MRI on 02/08/18 and f/u with urology on 02/09/18.   BP (!) 163/79 (BP Location: Right Arm)   Pulse 99   Temp 98 F (36.7 C) (Oral)   Resp 20   SpO2 99%   Wt Readings from Last 3 Encounters:  01/20/18 167 lb 8.6 oz (76 kg)  10/25/17 171 lb 3.2 oz (77.7 kg)  06/28/17 173 lb (78.5 kg)   Loma Sousa, RN BSN

## 2018-02-04 NOTE — Progress Notes (Signed)
  Radiation Oncology         [phone] ________________________________  Name: Robert Zhang MRN: [id number]  Date: 02/04/2018  DOB: 10-21-1945  COMPLEX SIMULATION NOTE  NARRATIVE:  The patient was brought to the [facility] today following prostate seed implantation approximately one month ago.  Identity was confirmed.  All relevant records and images related to the planned course of therapy were reviewed.  Then, the patient was set-up supine.  CT images were obtained.  The CT images were loaded into the planning software.  Then the prostate and rectum were contoured.  Treatment planning then occurred.  The implanted iodine 125 seeds were identified by the physics staff for projection of radiation distribution  I have requested : 3D Simulation  I have requested a DVH of the following structures: Prostate and rectum.    ________________________________  Sheral Apley Tammi Klippel, M.D.  This document serves as a record of services personally performed by Tyler Pita, MD. It was created on his behalf by Wilburn Mylar, a trained medical scribe. The creation of this record is based on the scribe's personal observations and the provider's statements to them. This document has been checked and approved by the attending provider.

## 2018-02-08 ENCOUNTER — Ambulatory Visit (HOSPITAL_COMMUNITY)
Admission: RE | Admit: 2018-02-08 | Discharge: 2018-02-08 | Disposition: A | Discharge status: Home / Self Care | Payer: Medicare Other | Source: Ambulatory Visit | Attending: Urology | Admitting: Urology

## 2018-02-08 DIAGNOSIS — C61 Malignant neoplasm of prostate: Secondary | ICD-10-CM | POA: Insufficient documentation

## 2018-02-09 ENCOUNTER — Ambulatory Visit: Payer: Medicare Other | Admitting: Urology

## 2018-02-09 DIAGNOSIS — R351 Nocturia: Secondary | ICD-10-CM

## 2018-02-09 DIAGNOSIS — N401 Enlarged prostate with lower urinary tract symptoms: Secondary | ICD-10-CM

## 2018-02-22 DIAGNOSIS — E11319 Type 2 diabetes mellitus with unspecified diabetic retinopathy without macular edema: Secondary | ICD-10-CM | POA: Diagnosis not present

## 2018-03-10 ENCOUNTER — Encounter: Payer: Self-pay | Admitting: Radiation Oncology

## 2018-03-14 NOTE — Progress Notes (Signed)
  Radiation Oncology         [phone] ________________________________  Name: Robert Zhang MRN: [id number]  Date: 03/10/2018  DOB: [birth year]  3D Planning Note   Prostate Brachytherapy Post-Implant Dosimetry  Diagnosis: 72 y.o. gentleman with Stage T1c adenocarcinoma of the prostate with Gleason Score of 3+3, and PSA of 10.1  Narrative: On a previous date, Robert Zhang returned following prostate seed implantation for post implant planning. He underwent CT scan complex simulation to delineate the three-dimensional structures of the pelvis and demonstrate the radiation distribution.  Since that time, the seed localization, and complex isodose planning with dose volume histograms have now been completed.  Results:   Prostate Coverage - The dose of radiation delivered to the 90% or more of the prostate gland (D90) was 105.8% of the prescription dose. This exceeds our goal of greater than 90%. Rectal Sparing - The volume of rectal tissue receiving the prescription dose or higher was 0.0 cc. This falls under our thresholds tolerance of 1.0 cc.  Impression: The prostate seed implant appears to show adequate target coverage and appropriate rectal sparing.  Plan:  The patient will continue to follow with urology for ongoing PSA determinations. I would anticipate a high likelihood for local tumor control with minimal risk for rectal morbidity.  ________________________________  Sheral Apley Tammi Klippel, M.D.

## 2018-05-04 ENCOUNTER — Ambulatory Visit: Payer: Medicare Other | Admitting: Urology

## 2018-05-04 DIAGNOSIS — C61 Malignant neoplasm of prostate: Secondary | ICD-10-CM | POA: Diagnosis not present

## 2018-05-04 DIAGNOSIS — R351 Nocturia: Secondary | ICD-10-CM

## 2018-05-04 DIAGNOSIS — N401 Enlarged prostate with lower urinary tract symptoms: Secondary | ICD-10-CM | POA: Diagnosis not present

## 2018-07-25 ENCOUNTER — Other Ambulatory Visit: Payer: Self-pay

## 2018-07-25 DIAGNOSIS — I6521 Occlusion and stenosis of right carotid artery: Secondary | ICD-10-CM

## 2018-08-01 ENCOUNTER — Encounter: Payer: Self-pay | Admitting: Surgery

## 2018-08-01 ENCOUNTER — Ambulatory Visit (INDEPENDENT_AMBULATORY_CARE_PROVIDER_SITE_OTHER): Payer: Medicare Other | Admitting: Surgery

## 2018-08-01 ENCOUNTER — Ambulatory Visit (HOSPITAL_COMMUNITY)
Admission: RE | Admit: 2018-08-01 | Discharge: 2018-08-01 | Disposition: A | Discharge status: Home / Self Care | Payer: Medicare Other | Source: Ambulatory Visit | Attending: Family | Admitting: Family

## 2018-08-01 ENCOUNTER — Other Ambulatory Visit: Payer: Self-pay

## 2018-08-01 VITALS — BP 182/84 | HR 77 | Temp 97.0°F | Resp 16 | Ht 67.0 in | Wt 171.0 lb

## 2018-08-01 DIAGNOSIS — I6521 Occlusion and stenosis of right carotid artery: Secondary | ICD-10-CM | POA: Insufficient documentation

## 2018-08-01 NOTE — Progress Notes (Signed)
Vascular and Vein Specialist of [facility]  Patient name: Robert Zhang MRN: [id number] DOB: 09-24-1945 Sex: male   REASON FOR VISIT:    Follow up  HISOTRY OF PRESENT ILLNESS:    Robert A Murrayis a 73 y.o.malereturns today for follow-up. In 2016 he had an episode where his left lip went numb. This lasted about a minute. He had a second event were his left lip and left foot went numb, this also lasting approximately 1 minute. He has not had any additional symptoms. A carotid Doppler study showed 50-69% stenosis. He was sent for a CT scan for further evaluation. 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 There was only mild extracranial carotid disease. Medical management was recommended.  He denies any new symptoms since I last saw him.  He has some right shoulder pain.  He has completed prostate cancer radiation.  PAST MEDICAL HISTORY:   Past Medical History:  Diagnosis Date  . Aortic atherosclerosis ([place]) 10/12/2017   Noted on CT   . Arthritis    bilateral legs /   Neck-Degenerative Disc Disease  . Carotid artery occlusion    Right Carotid   . Cervical spondylosis   . Cervical stenosis of spine   . Degenerative disc disease, cervical    C4-7  . Diabetes mellitus without complication ([place])    diet and exercise controlled, no medications, hgb A1C 07/31/2017 (6.9)  . External hemorrhoids   . Hepatic cyst 10/12/2017   Low density lession right hepatic lobe, noted on CT  . Hepatitis    Hepatitis C negative 12/05/2014  . History of colon polyps   . Hypertension   . Leg cramps   . Prostate CA ([place])   . Protrusion of intervertebral disc of lumbosacral region    L5-S1  . PTSD (post-traumatic stress disorder)   . PTSD (post-traumatic stress disorder)   . Pulmonary nodule 11/26/2016   34mm subpleural right lower lobe, noted on CT ABD/PELVIS  . Right shoulder  tendinitis   . Sleep apnea    Stop Bang score of 5  . Stroke [facility]) 2016   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     FAMILY HISTORY:   Family History  Problem Relation Age of Onset  . Diabetes Mother   . Hypertension Father   . Heart disease Father        before age 24  . Heart attack Father   . Pancreatic cancer Brother   . Anesthesia problems Neg Hx   . Hypotension Neg Hx   . Malignant hyperthermia Neg Hx   . Pseudochol deficiency Neg Hx     SOCIAL HISTORY:   Social History   Tobacco Use  . Smoking status: Former Smoker    Packs/day: 1.00    Years: 13.00    Pack years: 13.00    Types: Cigarettes    Last attempt to quit: 04/17/1976    Years since quitting: 42.3  . Smokeless tobacco: Never Used  Substance Use Topics  . Alcohol use: Yes    Alcohol/week: 4.0 - 6.0 standard drinks    Types: 4 - 6 Shots of liquor per week    Comment: 2-3 mixed drinks on a saturday night     ALLERGIES:   Allergies  Allergen Reactions  . Penicillins Rash and Other (See Comments)    Childhood allergy Has patient had a PCN reaction causing immediate rash, facial/tongue/throat  swelling, SOB or lightheadedness with hypotension: yes Has patient had a PCN reaction causing severe rash involving mucus membranes or skin necrosis: no Has patient had a PCN reaction that required hospitalization no Has patient had a PCN reaction occurring within the last 10 years: no If all of the above answers are "NO", then may proceed with Cephalosporin use.       CURRENT MEDICATIONS:   Current Outpatient Medications  Medication Sig Dispense Refill  . aspirin 81 MG tablet Take 1 tablet (81 mg total) by mouth daily. 30 tablet   . Cholecalciferol (VITAMIN D3) 5000 UNITS CAPS Take 1 capsule by mouth daily.    . hydrochlorothiazide (HYDRODIURIL) 12.5 MG tablet Take by mouth.    . Lancets (ONETOUCH ULTRASOFT) lancets     . losartan (COZAAR) 50 MG tablet Take 50 mg by mouth daily.    . naproxen  (NAPROSYN) 500 MG tablet Take 250 mg by mouth daily.     . ONE TOUCH ULTRA TEST test strip     . potassium chloride SA (K-DUR,KLOR-CON) 20 MEQ tablet Take 20 mEq by mouth daily as needed (cramping).     . tamsulosin (FLOMAX) 0.4 MG CAPS capsule Take 0.4 mg by mouth at bedtime.  11  . traMADol (ULTRAM) 50 MG tablet Take 1 tablet (50 mg total) by mouth every 6 (six) hours as needed for moderate pain. 30 tablet 0   No current facility-administered medications for this visit.     REVIEW OF SYSTEMS:   [X]  denotes positive finding, [ ]  denotes negative finding Cardiac  Comments:  Chest pain or chest pressure:    Shortness of breath upon exertion:    Short of breath when lying flat:    Irregular heart rhythm:        Vascular    Pain in calf, thigh, or hip brought on by ambulation:    Pain in feet at night that wakes you up from your sleep:     Blood clot in your veins:    Leg swelling:         Pulmonary    Oxygen at home:    Productive cough:     Wheezing:         Neurologic    Sudden weakness in arms or legs:     Sudden numbness in arms or legs:     Sudden onset of difficulty speaking or slurred speech:    Temporary loss of vision in one eye:     Problems with dizziness:         Gastrointestinal    Blood in stool:     Vomited blood:         Genitourinary    Burning when urinating:     Blood in urine:        Psychiatric    Major depression:         Hematologic    Bleeding problems:    Problems with blood clotting too easily:        Skin    Rashes or ulcers:        Constitutional    Fever or chills:      PHYSICAL EXAM:   Vitals:   08/01/18 0841 08/01/18 0844  BP: (!) 184/84 (!) 182/84  Pulse: 76 77  Resp: 16   Temp: (!) 97 F (36.1 C)   TempSrc: Oral   SpO2: 99%   Weight: 77.6 kg   Height: 5\' 7"  (1.702 m)  GENERAL: The patient is a well-nourished male, in no acute distress. The vital signs are documented above. CARDIAC: There is a regular rate and  rhythm.  VASCULAR: no carotid bruits PULMONARY: Non-labored respirations MUSCULOSKELETAL: There are no major deformities or cyanosis. NEUROLOGIC: No focal weakness or paresthesias are detected. SKIN: There are no ulcers or rashes noted. PSYCHIATRIC: The patient has a normal affect.  STUDIES:   I have reviewed his u/s with the following findings:  Right Carotid: Velocities in the right ICA are consistent with a 1-39% stenosis.  Non-hemodynamically significant plaque <50% noted in the CCA.  Left Carotid: Velocities in the left ICA are consistent with a 1-39% stenosis.  Non-hemodynamically significant plaque noted in the CCA.  Vertebrals:  Left vertebral artery demonstrates antegrade flow. Right vertebral artery demonstrates high resistant flow. Subclavians: Right subclavian artery flow was disturbed. Normal flow hemodynamics were seen in the left subclavian artery.  MEDICAL ISSUES:   History of left brain stroke:  The patient remains asymptomatic.  He will continue with annual surveillance    Leia Alf, MD, FACS [facility] and Vein Specialists of [facility] [phone] Pager [phone]

## 2018-08-03 ENCOUNTER — Ambulatory Visit: Payer: Medicare Other | Admitting: Urology

## 2018-08-03 DIAGNOSIS — N401 Enlarged prostate with lower urinary tract symptoms: Secondary | ICD-10-CM

## 2018-08-03 DIAGNOSIS — C61 Malignant neoplasm of prostate: Secondary | ICD-10-CM

## 2018-08-03 DIAGNOSIS — R351 Nocturia: Secondary | ICD-10-CM | POA: Diagnosis not present

## 2018-10-12 DIAGNOSIS — R6889 Other general symptoms and signs: Secondary | ICD-10-CM | POA: Diagnosis not present

## 2018-12-20 DIAGNOSIS — Z1389 Encounter for screening for other disorder: Secondary | ICD-10-CM | POA: Diagnosis not present

## 2018-12-20 DIAGNOSIS — I708 Atherosclerosis of other arteries: Secondary | ICD-10-CM | POA: Diagnosis not present

## 2018-12-20 DIAGNOSIS — E119 Type 2 diabetes mellitus without complications: Secondary | ICD-10-CM | POA: Diagnosis not present

## 2018-12-20 DIAGNOSIS — I10 Essential (primary) hypertension: Secondary | ICD-10-CM | POA: Diagnosis not present

## 2018-12-20 DIAGNOSIS — Z0000 Encounter for general adult medical examination without abnormal findings: Secondary | ICD-10-CM | POA: Diagnosis not present

## 2018-12-28 ENCOUNTER — Other Ambulatory Visit (HOSPITAL_COMMUNITY): Payer: Self-pay | Admitting: Family Medicine

## 2018-12-28 ENCOUNTER — Other Ambulatory Visit: Payer: Self-pay | Admitting: Family Medicine

## 2018-12-28 DIAGNOSIS — D1722 Benign lipomatous neoplasm of skin and subcutaneous tissue of left arm: Secondary | ICD-10-CM

## 2018-12-28 DIAGNOSIS — R6889 Other general symptoms and signs: Secondary | ICD-10-CM | POA: Diagnosis not present

## 2019-01-03 ENCOUNTER — Ambulatory Visit (HOSPITAL_COMMUNITY)
Admission: RE | Admit: 2019-01-03 | Discharge: 2019-01-03 | Disposition: A | Discharge status: Home / Self Care | Payer: Medicare Other | Source: Ambulatory Visit | Attending: Family Medicine | Admitting: Family Medicine

## 2019-01-03 ENCOUNTER — Other Ambulatory Visit: Payer: Self-pay

## 2019-01-03 DIAGNOSIS — D1722 Benign lipomatous neoplasm of skin and subcutaneous tissue of left arm: Secondary | ICD-10-CM | POA: Insufficient documentation

## 2019-01-03 DIAGNOSIS — R2232 Localized swelling, mass and lump, left upper limb: Secondary | ICD-10-CM | POA: Diagnosis not present

## 2019-01-19 ENCOUNTER — Other Ambulatory Visit: Payer: Self-pay

## 2019-01-19 ENCOUNTER — Ambulatory Visit (INDEPENDENT_AMBULATORY_CARE_PROVIDER_SITE_OTHER): Payer: Medicare Other | Admitting: General Surgery

## 2019-01-19 ENCOUNTER — Encounter: Payer: Self-pay | Admitting: General Surgery

## 2019-01-19 VITALS — BP 154/84 | HR 92 | Temp 96.9°F | Resp 16 | Ht 68.0 in | Wt 166.0 lb

## 2019-01-19 DIAGNOSIS — D1720 Benign lipomatous neoplasm of skin and subcutaneous tissue of unspecified limb: Secondary | ICD-10-CM

## 2019-01-19 NOTE — H&P (Signed)
Robert Zhang; [id number]; Apr 06, 1946   HPI Patient is a 73 year old white male who was referred to my care by Dr. Hilma Favors for evaluation and treatment of a lipoma on his left arm.  Is been present for some time now, but seems to have increased in size and is causing him discomfort.  He rates his pain discomfort as 4 out of 10 when pressure is applied.  It is in the antecubital fossa of the left arm.  He had an ultrasound of this which revealed it to be a lipoma which was 5 cm in size. Past Medical History:  Diagnosis Date  . Aortic atherosclerosis ([place]) 10/12/2017   Noted on CT   . Arthritis    bilateral legs /   Neck-Degenerative Disc Disease  . Carotid artery occlusion    Right Carotid   . Cervical spondylosis   . Cervical stenosis of spine   . Degenerative disc disease, cervical    C4-7  . Diabetes mellitus without complication ([place])    diet and exercise controlled, no medications, hgb A1C 07/31/2017 (6.9)  . External hemorrhoids   . Hepatic cyst 10/12/2017   Low density lession right hepatic lobe, noted on CT  . Hepatitis    Hepatitis C negative 12/05/2014  . History of colon polyps   . Hypertension   . Leg cramps   . Prostate CA ([place])   . Protrusion of intervertebral disc of lumbosacral region    L5-S1  . PTSD (post-traumatic stress disorder)   . PTSD (post-traumatic stress disorder)   . Pulmonary nodule 11/26/2016   88mm subpleural right lower lobe, noted on CT ABD/PELVIS  . Right shoulder tendinitis   . Sleep apnea    Stop Bang score of 5  . Stroke [facility]) 2016   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    Past Surgical History:  Procedure Laterality Date  . CATARACT EXTRACTION W/PHACO  07/20/2011   Procedure: CATARACT EXTRACTION PHACO AND INTRAOCULAR LENS PLACEMENT (IOC);  Surgeon: Williams Che, MD;  Location: [facility];  Service: Ophthalmology;  Laterality: Right;  CDE=25.73  . CATARACT EXTRACTION W/PHACO  08/31/2011   Procedure: CATARACT EXTRACTION PHACO AND  INTRAOCULAR LENS PLACEMENT (IOC);  Surgeon: Williams Che, MD;  Location: [facility];  Service: Ophthalmology;  Laterality: Left;  CDE: 38.59  . COLONOSCOPY N/A 09/10/2016   Procedure: COLONOSCOPY;  Surgeon: Rogene Houston, MD;  Location: [facility];  Service: Endoscopy;  Laterality: N/A;  1200  . COLONOSCOPY W/ POLYPECTOMY  2011   [facility]-Dr. Rehman  . cortisone injection Left 01/13/2016   left shoulder  . cortisone injection Left 09/21/2017   left shoulder  . CYSTOSCOPY  01/20/2018   Procedure: CYSTOSCOPY FLEXIBLE;  Surgeon: Cleon Gustin, MD;  Location: [facility];  Service: Urology;;  no seeds found in bladder  . EYE SURGERY     laser surgery to repair retina tare  . head reconstruction  1966   due to head injury in war  . PROSTATE BIOPSY  05/20/2016  . RADIOACTIVE SEED IMPLANT N/A 01/20/2018   Procedure: RADIOACTIVE SEED IMPLANT/BRACHYTHERAPY IMPLANT;  Surgeon: Cleon Gustin, MD;  Location: [facility];  Service: Urology;  Laterality: N/A;     86    seeds implanted  . REFRACTIVE SURGERY Left 03/05/2017   to resolve cloudiness  . SPACE OAR INSTILLATION N/A 01/20/2018   Procedure: SPACE OAR INSTILLATION;  Surgeon: Cleon Gustin, MD;  Location: [facility];  Service: Urology;  Laterality: N/A;  . TRIGGER FINGER RELEASE      Family History  Problem Relation Age of Onset  . Diabetes Mother   . Hypertension Father   . Heart disease Father        before age 48  . Heart attack Father   . Pancreatic cancer Brother   . Anesthesia problems Neg Hx   . Hypotension Neg Hx   . Malignant hyperthermia Neg Hx   . Pseudochol deficiency Neg Hx     Current Outpatient Medications on File Prior to Visit  Medication Sig Dispense Refill  . aspirin 81 MG tablet Take 1 tablet (81 mg total) by mouth daily. 30 tablet   . Cholecalciferol (VITAMIN D3) 5000 UNITS CAPS Take 1 capsule by mouth daily.    . hydrochlorothiazide  (HYDRODIURIL) 12.5 MG tablet Take by mouth.    . Lancets (ONETOUCH ULTRASOFT) lancets     . losartan (COZAAR) 50 MG tablet Take 50 mg by mouth daily.    Marland Kitchen losartan-hydrochlorothiazide (HYZAAR) 50-12.5 MG tablet Take 1 tablet by mouth daily.    . naproxen (NAPROSYN) 500 MG tablet Take 250 mg by mouth daily.     . ONE TOUCH ULTRA TEST test strip     . potassium chloride SA (K-DUR,KLOR-CON) 20 MEQ tablet Take 20 mEq by mouth daily as needed (cramping).     . tamsulosin (FLOMAX) 0.4 MG CAPS capsule Take 0.4 mg by mouth at bedtime.  11  . traMADol (ULTRAM) 50 MG tablet Take 1 tablet (50 mg total) by mouth every 6 (six) hours as needed for moderate pain. (Patient not taking: Reported on 01/19/2019) 30 tablet 0   No current facility-administered medications on file prior to visit.     Allergies  Allergen Reactions  . Penicillins Rash and Other (See Comments)    Childhood allergy Has patient had a PCN reaction causing immediate rash, facial/tongue/throat swelling, SOB or lightheadedness with hypotension: yes Has patient had a PCN reaction causing severe rash involving mucus membranes or skin necrosis: no Has patient had a PCN reaction that required hospitalization no Has patient had a PCN reaction occurring within the last 10 years: no If all of the above answers are "NO", then may proceed with Cephalosporin use.      Social History   Substance and Sexual Activity  Alcohol Use Yes  . Alcohol/week: 4.0 - 6.0 standard drinks  . Types: 4 - 6 Shots of liquor per week   Comment: 2-3 mixed drinks on a saturday night    Social History   Tobacco Use  Smoking Status Former Smoker  . Packs/day: 1.00  . Years: 13.00  . Pack years: 13.00  . Types: Cigarettes  . Quit date: 04/17/1976  . Years since quitting: 42.7  Smokeless Tobacco Never Used    Review of Systems  Constitutional: Negative.   HENT: Negative.   Eyes: Negative.   Respiratory: Negative.   Cardiovascular: Negative.    Gastrointestinal: Negative.   Genitourinary: Positive for dysuria and frequency.  Musculoskeletal: Positive for back pain, joint pain and neck pain.  Skin: Negative.   Neurological: Negative.   Endo/Heme/Allergies: Negative.   Psychiatric/Behavioral: Negative.     Objective   Vitals:   01/19/19 0926  BP: (!) 154/84  Pulse: 92  Resp: 16  Temp: (!) 96.9 F (36.1 C)  SpO2: 96%    Physical Exam Vitals signs reviewed.  Constitutional:      Appearance: Normal appearance. He  is normal weight. He is not ill-appearing.  HENT:     Head: Normocephalic and atraumatic.  Cardiovascular:     Rate and Rhythm: Normal rate and regular rhythm.     Heart sounds: Normal heart sounds. No murmur. No friction rub. No gallop.   Pulmonary:     Effort: Pulmonary effort is normal. No respiratory distress.     Breath sounds: Normal breath sounds. No stridor. No wheezing, rhonchi or rales.  Musculoskeletal: Normal range of motion.     Comments: 5 cm ovoid subcutaneous rubbery mass in the left antecubital fossa.  Skin:    General: Skin is warm and dry.  Neurological:     Mental Status: He is alert and oriented to person, place, and time.    Primary care notes reviewed Assessment  Lipoma, left upper extremity Plan   Patient is scheduled for excision of the lipoma, left upper extremity on 02/01/2019.  The risks and benefits of the procedure including bleeding and infection were fully explained to the patient, who gave informed consent.

## 2019-01-19 NOTE — Progress Notes (Signed)
Robert Zhang; [id number]; [birth year]   HPI Patient is a 73 year old white male who was referred to my care by Dr. Hilma Favors for evaluation and treatment of a lipoma on his left arm.  Is been present for some time now, but seems to have increased in size and is causing him discomfort.  He rates his pain discomfort as 4 out of 10 when pressure is applied.  It is in the antecubital fossa of the left arm.  He had an ultrasound of this which revealed it to be a lipoma which was 5 cm in size. Past Medical History:  Diagnosis Date  . Aortic atherosclerosis ([place]) 10/12/2017   Noted on CT   . Arthritis    bilateral legs /   Neck-Degenerative Disc Disease  . Carotid artery occlusion    Right Carotid   . Cervical spondylosis   . Cervical stenosis of spine   . Degenerative disc disease, cervical    C4-7  . Diabetes mellitus without complication ([place])    diet and exercise controlled, no medications, hgb A1C 07/31/2017 (6.9)  . External hemorrhoids   . Hepatic cyst 10/12/2017   Low density lession right hepatic lobe, noted on CT  . Hepatitis    Hepatitis C negative 12/05/2014  . History of colon polyps   . Hypertension   . Leg cramps   . Prostate CA ([place])   . Protrusion of intervertebral disc of lumbosacral region    L5-S1  . PTSD (post-traumatic stress disorder)   . PTSD (post-traumatic stress disorder)   . Pulmonary nodule 11/26/2016   46mm subpleural right lower lobe, noted on CT ABD/PELVIS  . Right shoulder tendinitis   . Sleep apnea    Stop Bang score of 5  . Stroke [facility]) 2016   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    Past Surgical History:  Procedure Laterality Date  . CATARACT EXTRACTION W/PHACO  07/20/2011   Procedure: CATARACT EXTRACTION PHACO AND INTRAOCULAR LENS PLACEMENT (IOC);  Surgeon: Williams Che, MD;  Location: [facility];  Service: Ophthalmology;  Laterality: Right;  CDE=25.73  . CATARACT EXTRACTION W/PHACO  08/31/2011   Procedure: CATARACT EXTRACTION PHACO AND  INTRAOCULAR LENS PLACEMENT (IOC);  Surgeon: Williams Che, MD;  Location: [facility];  Service: Ophthalmology;  Laterality: Left;  CDE: 38.59  . COLONOSCOPY N/A 09/10/2016   Procedure: COLONOSCOPY;  Surgeon: Rogene Houston, MD;  Location: [facility];  Service: Endoscopy;  Laterality: N/A;  1200  . COLONOSCOPY W/ POLYPECTOMY  2011   [facility]-Dr. Rehman  . cortisone injection Left 01/13/2016   left shoulder  . cortisone injection Left 09/21/2017   left shoulder  . CYSTOSCOPY  01/20/2018   Procedure: CYSTOSCOPY FLEXIBLE;  Surgeon: Cleon Gustin, MD;  Location: [facility];  Service: Urology;;  no seeds found in bladder  . EYE SURGERY     laser surgery to repair retina tare  . head reconstruction  1966   due to head injury in war  . PROSTATE BIOPSY  05/20/2016  . RADIOACTIVE SEED IMPLANT N/A 01/20/2018   Procedure: RADIOACTIVE SEED IMPLANT/BRACHYTHERAPY IMPLANT;  Surgeon: Cleon Gustin, MD;  Location: [facility];  Service: Urology;  Laterality: N/A;     86    seeds implanted  . REFRACTIVE SURGERY Left 03/05/2017   to resolve cloudiness  . SPACE OAR INSTILLATION N/A 01/20/2018   Procedure: SPACE OAR INSTILLATION;  Surgeon: Cleon Gustin, MD;  Location: [facility];  Service: Urology;  Laterality: N/A;  . TRIGGER FINGER RELEASE      Family History  Problem Relation Age of Onset  . Diabetes Mother   . Hypertension Father   . Heart disease Father        before age 58  . Heart attack Father   . Pancreatic cancer Brother   . Anesthesia problems Neg Hx   . Hypotension Neg Hx   . Malignant hyperthermia Neg Hx   . Pseudochol deficiency Neg Hx     Current Outpatient Medications on File Prior to Visit  Medication Sig Dispense Refill  . aspirin 81 MG tablet Take 1 tablet (81 mg total) by mouth daily. 30 tablet   . Cholecalciferol (VITAMIN D3) 5000 UNITS CAPS Take 1 capsule by mouth daily.    . hydrochlorothiazide  (HYDRODIURIL) 12.5 MG tablet Take by mouth.    . Lancets (ONETOUCH ULTRASOFT) lancets     . losartan (COZAAR) 50 MG tablet Take 50 mg by mouth daily.    Marland Kitchen losartan-hydrochlorothiazide (HYZAAR) 50-12.5 MG tablet Take 1 tablet by mouth daily.    . naproxen (NAPROSYN) 500 MG tablet Take 250 mg by mouth daily.     . ONE TOUCH ULTRA TEST test strip     . potassium chloride SA (K-DUR,KLOR-CON) 20 MEQ tablet Take 20 mEq by mouth daily as needed (cramping).     . tamsulosin (FLOMAX) 0.4 MG CAPS capsule Take 0.4 mg by mouth at bedtime.  11  . traMADol (ULTRAM) 50 MG tablet Take 1 tablet (50 mg total) by mouth every 6 (six) hours as needed for moderate pain. (Patient not taking: Reported on 01/19/2019) 30 tablet 0   No current facility-administered medications on file prior to visit.     Allergies  Allergen Reactions  . Penicillins Rash and Other (See Comments)    Childhood allergy Has patient had a PCN reaction causing immediate rash, facial/tongue/throat swelling, SOB or lightheadedness with hypotension: yes Has patient had a PCN reaction causing severe rash involving mucus membranes or skin necrosis: no Has patient had a PCN reaction that required hospitalization no Has patient had a PCN reaction occurring within the last 10 years: no If all of the above answers are "NO", then may proceed with Cephalosporin use.      Social History   Substance and Sexual Activity  Alcohol Use Yes  . Alcohol/week: 4.0 - 6.0 standard drinks  . Types: 4 - 6 Shots of liquor per week   Comment: 2-3 mixed drinks on a saturday night    Social History   Tobacco Use  Smoking Status Former Smoker  . Packs/day: 1.00  . Years: 13.00  . Pack years: 13.00  . Types: Cigarettes  . Quit date: 04/17/1976  . Years since quitting: 42.7  Smokeless Tobacco Never Used    Review of Systems  Constitutional: Negative.   HENT: Negative.   Eyes: Negative.   Respiratory: Negative.   Cardiovascular: Negative.    Gastrointestinal: Negative.   Genitourinary: Positive for dysuria and frequency.  Musculoskeletal: Positive for back pain, joint pain and neck pain.  Skin: Negative.   Neurological: Negative.   Endo/Heme/Allergies: Negative.   Psychiatric/Behavioral: Negative.     Objective   Vitals:   01/19/19 0926  BP: (!) 154/84  Pulse: 92  Resp: 16  Temp: (!) 96.9 F (36.1 C)  SpO2: 96%    Physical Exam Vitals signs reviewed.  Constitutional:      Appearance: Normal appearance. He  is normal weight. He is not ill-appearing.  HENT:     Head: Normocephalic and atraumatic.  Cardiovascular:     Rate and Rhythm: Normal rate and regular rhythm.     Heart sounds: Normal heart sounds. No murmur. No friction rub. No gallop.   Pulmonary:     Effort: Pulmonary effort is normal. No respiratory distress.     Breath sounds: Normal breath sounds. No stridor. No wheezing, rhonchi or rales.  Musculoskeletal: Normal range of motion.     Comments: 5 cm ovoid subcutaneous rubbery mass in the left antecubital fossa.  Skin:    General: Skin is warm and dry.  Neurological:     Mental Status: He is alert and oriented to person, place, and time.    Primary care notes reviewed Assessment  Lipoma, left upper extremity Plan   Patient is scheduled for excision of the lipoma, left upper extremity on 02/01/2019.  The risks and benefits of the procedure including bleeding and infection were fully explained to the patient, who gave informed consent.

## 2019-01-19 NOTE — Patient Instructions (Signed)
Lipoma  A lipoma is a noncancerous (benign) tumor that is made up of fat cells. This is a very common type of soft-tissue growth. Lipomas are usually found under the skin (subcutaneous). They may occur in any tissue of the body that contains fat. Common areas for lipomas to appear include the back, shoulders, buttocks, and thighs.  Lipomas grow slowly, and they are usually painless. Most lipomas do not cause problems and do not require treatment. What are the causes? The cause of this condition is not known. What increases the risk? You are more likely to develop this condition if:  You are 40-60 years old.  You have a family history of lipomas. What are the signs or symptoms? A lipoma usually appears as a small, round bump under the skin. In most cases, the lump will:  Feel soft or rubbery.  Not cause pain or other symptoms. However, if a lipoma is located in an area where it pushes on nerves, it can become painful or cause other symptoms. How is this diagnosed? A lipoma can usually be diagnosed with a physical exam. You may also have tests to confirm the diagnosis and to rule out other conditions. Tests may include:  Imaging tests, such as a CT scan or MRI.  Removal of a tissue sample to be looked at under a microscope (biopsy). How is this treated? Treatment for this condition depends on the size of the lipoma and whether it is causing any symptoms.  For small lipomas that are not causing problems, no treatment is needed.  If a lipoma is bigger or it causes problems, surgery may be done to remove the lipoma. Lipomas can also be removed to improve appearance. Most often, the procedure is done after applying a medicine that numbs the area (local anesthetic). Follow these instructions at home:  Watch your lipoma for any changes.  Keep all follow-up visits as told by your health care provider. This is important. Contact a health care provider if:  Your lipoma becomes larger or  hard.  Your lipoma becomes painful, red, or increasingly swollen. These could be signs of infection or a more serious condition. Get help right away if:  You develop tingling or numbness in an area near the lipoma. This could indicate that the lipoma is causing nerve damage. Summary  A lipoma is a noncancerous tumor that is made up of fat cells.  Most lipomas do not cause problems and do not require treatment.  If a lipoma is bigger or it causes problems, surgery may be done to remove the lipoma. This information is not intended to replace advice given to you by your health care provider. Make sure you discuss any questions you have with your health care provider. Document Released: 04/24/2002 Document Revised: 04/20/2017 Document Reviewed: 04/20/2017 Elsevier Patient Education  2020 Elsevier Inc.  

## 2019-01-27 NOTE — Patient Instructions (Addendum)
Your procedure is scheduled on: 02/01/2019  Report to [facility] at 7:05    AM.  Call this number if you have problems the morning of surgery: [phone]   Remember:   Do not Eat or Drink after midnight   :  Take these medicines the morning of surgery with A SIP OF WATER: none   Do not wear jewelry, make-up or nail polish.  Do not wear lotions, powders, or perfumes. You may wear deodorant.  Do not shave 48 hours prior to surgery. Men may shave face and neck.  Do not bring valuables to the hospital.  Contacts, dentures or bridgework may not be worn into surgery.  Leave suitcase in the car. After surgery it may be brought to your room.  For patients admitted to the hospital, checkout time is 11:00 AM the day of discharge.   Patients discharged the day of surgery will not be allowed to drive home.    Special Instructions: Shower using CHG night before surgery and shower the day of surgery use CHG.  Use special wash - you have one bottle of CHG for all showers.  You should use approximately 1/2 of the bottle for each shower.  Lipoma  A lipoma is a noncancerous (benign) tumor that is made up of fat cells. This is a very common type of soft-tissue growth. Lipomas are usually found under the skin (subcutaneous). They may occur in any tissue of the body that contains fat. Common areas for lipomas to appear include the back, shoulders, buttocks, and thighs.  Lipomas grow slowly, and they are usually painless. Most lipomas do not cause problems and do not require treatment. What are the causes? The cause of this condition is not known. What increases the risk? You are more likely to develop this condition if:  You are 16-72 years old.  You have a family history of lipomas. What are the signs or symptoms? A lipoma usually appears as a small, round bump under the skin. In most cases, the lump will:  Feel soft or rubbery.  Not cause pain or other symptoms. However, if a lipoma is  located in an area where it pushes on nerves, it can become painful or cause other symptoms. How is this diagnosed? A lipoma can usually be diagnosed with a physical exam. You may also have tests to confirm the diagnosis and to rule out other conditions. Tests may include:  Imaging tests, such as a CT scan or MRI.  Removal of a tissue sample to be looked at under a microscope (biopsy). How is this treated? Treatment for this condition depends on the size of the lipoma and whether it is causing any symptoms.  For small lipomas that are not causing problems, no treatment is needed.  If a lipoma is bigger or it causes problems, surgery may be done to remove the lipoma. Lipomas can also be removed to improve appearance. Most often, the procedure is done after applying a medicine that numbs the area (local anesthetic). Follow these instructions at home:  Watch your lipoma for any changes.  Keep all follow-up visits as told by your health care provider. This is important. Contact a health care provider if:  Your lipoma becomes larger or hard.  Your lipoma becomes painful, red, or increasingly swollen. These could be signs of infection or a more serious condition. Get help right away if:  You develop tingling or numbness in an area near the lipoma. This could indicate that the lipoma  is causing nerve damage. Summary  A lipoma is a noncancerous tumor that is made up of fat cells.  Most lipomas do not cause problems and do not require treatment.  If a lipoma is bigger or it causes problems, surgery may be done to remove the lipoma. This information is not intended to replace advice given to you by your health care provider. Make sure you discuss any questions you have with your health care provider. Document Released: 04/24/2002 Document Revised: 04/20/2017 Document Reviewed: 04/20/2017 Elsevier Patient Education  [place].  Lipoma Removal, Care After Refer to this sheet in the  next few weeks. These instructions provide you with information about caring for yourself after your procedure. Your health care provider may also give you more specific instructions. Your treatment has been planned according to current medical practices, but problems sometimes occur. Call your health care provider if you have any problems or questions after your procedure. What can I expect after the procedure? After the procedure, it is common to have:  Mild pain.  Swelling.  Bruising. Follow these instructions at home:  Bathing  Do not take baths, swim, or use a hot tub until your health care provider approves. Ask your health care provider if you can take showers. You may only be allowed to take sponge baths for bathing.  Keep your bandage (dressing) dry until your health care provider says it can be removed. Incision care   Follow instructions from your health care provider about how to take care of your incision. Make sure you: ? Wash your hands with soap and water before you change your bandage (dressing). If soap and water are not available, use hand sanitizer. ? Change your dressing as told by your health care provider. ? Leave stitches (sutures), skin glue, or adhesive strips in place. These skin closures may need to stay in place for 2 weeks or longer. If adhesive strip edges start to loosen and curl up, you may trim the loose edges. Do not remove adhesive strips completely unless your health care provider tells you to do that.  Check your incision area every day for signs of infection. Check for: ? More redness, swelling, or pain. ? Fluid or blood. ? Warmth. ? Pus or a bad smell. Driving  Do not drive or operate heavy machinery while taking prescription pain medicine.  Do not drive for 24 hours if you received a medicine to help you relax (sedative) during your procedure.  Ask your health care provider when it is safe for you to drive. General instructions  Take  over-the-counter and prescription medicines only as told by your health care provider.  Do not use any tobacco products, such as cigarettes, chewing tobacco, and e-cigarettes. These can delay healing. If you need help quitting, ask your health care provider.  Return to your normal activities as told by your health care provider. Ask your health care provider what activities are safe for you.  Keep all follow-up visits as told by your health care provider. This is important. Contact a health care provider if:  You have more redness, swelling, or pain around your incision.  You have fluid or blood coming from your incision.  Your incision feels warm to the touch.  You have pus or a bad smell coming from your incision.  You have pain that does not get better with medicine. Get help right away if:  You have chills or a fever.  You have severe pain. This information is  not intended to replace advice given to you by your health care provider. Make sure you discuss any questions you have with your health care provider. Document Released: 07/18/2015 Document Revised: 12/26/2015 Document Reviewed: 07/18/2015 Elsevier Patient Education  2020 [place] Anesthesia, Adult, Care After This sheet gives you information about how to care for yourself after your procedure. Your health care provider may also give you more specific instructions. If you have problems or questions, contact your health care provider. What can I expect after the procedure? After the procedure, the following side effects are common:  Pain or discomfort at the IV site.  Nausea.  Vomiting.  Sore throat.  Trouble concentrating.  Feeling cold or chills.  Weak or tired.  Sleepiness and fatigue.  Soreness and body aches. These side effects can affect parts of the body that were not involved in surgery. Follow these instructions at home:  For at least 24 hours after the procedure:  Have a  responsible adult stay with you. It is important to have someone help care for you until you are awake and alert.  Rest as needed.  Do not: ? Participate in activities in which you could fall or become injured. ? Drive. ? Use heavy machinery. ? Drink alcohol. ? Take sleeping pills or medicines that cause drowsiness. ? Make important decisions or sign legal documents. ? Take care of children on your own. Eating and drinking  Follow any instructions from your health care provider about eating or drinking restrictions.  When you feel hungry, start by eating small amounts of foods that are soft and easy to digest (bland), such as toast. Gradually return to your regular diet.  Drink enough fluid to keep your urine pale yellow.  If you vomit, rehydrate by drinking water, juice, or clear broth. General instructions  If you have sleep apnea, surgery and certain medicines can increase your risk for breathing problems. Follow instructions from your health care provider about wearing your sleep device: ? Anytime you are sleeping, including during daytime naps. ? While taking prescription pain medicines, sleeping medicines, or medicines that make you drowsy.  Return to your normal activities as told by your health care provider. Ask your health care provider what activities are safe for you.  Take over-the-counter and prescription medicines only as told by your health care provider.  If you smoke, do not smoke without supervision.  Keep all follow-up visits as told by your health care provider. This is important. Contact a health care provider if:  You have nausea or vomiting that does not get better with medicine.  You cannot eat or drink without vomiting.  You have pain that does not get better with medicine.  You are unable to pass urine.  You develop a skin rash.  You have a fever.  You have redness around your IV site that gets worse. Get help right away if:  You have  difficulty breathing.  You have chest pain.  You have blood in your urine or stool, or you vomit blood. Summary  After the procedure, it is common to have a sore throat or nausea. It is also common to feel tired.  Have a responsible adult stay with you for the first 24 hours after general anesthesia. It is important to have someone help care for you until you are awake and alert.  When you feel hungry, start by eating small amounts of foods that are soft and easy to digest (bland), such as toast.  Gradually return to your regular diet.  Drink enough fluid to keep your urine pale yellow.  Return to your normal activities as told by your health care provider. Ask your health care provider what activities are safe for you. This information is not intended to replace advice given to you by your health care provider. Make sure you discuss any questions you have with your health care provider. Document Released: 08/10/2000 Document Revised: 05/07/2017 Document Reviewed: 12/18/2016 Elsevier Patient Education  2020 [employer].

## 2019-01-30 ENCOUNTER — Other Ambulatory Visit: Payer: Self-pay

## 2019-01-30 ENCOUNTER — Encounter (HOSPITAL_COMMUNITY)
Admission: RE | Admit: 2019-01-30 | Discharge: 2019-01-30 | Disposition: A | Discharge status: Home / Self Care | Payer: Medicare Other | Source: Ambulatory Visit | Attending: General Surgery | Admitting: General Surgery

## 2019-01-30 ENCOUNTER — Other Ambulatory Visit (HOSPITAL_COMMUNITY)
Admission: RE | Admit: 2019-01-30 | Discharge: 2019-01-30 | Disposition: A | Discharge status: Home / Self Care | Payer: Medicare Other | Source: Ambulatory Visit | Attending: Urology | Admitting: Urology

## 2019-01-30 ENCOUNTER — Encounter (HOSPITAL_COMMUNITY): Payer: Self-pay

## 2019-01-30 DIAGNOSIS — Z01818 Encounter for other preprocedural examination: Secondary | ICD-10-CM | POA: Diagnosis not present

## 2019-01-30 DIAGNOSIS — I10 Essential (primary) hypertension: Secondary | ICD-10-CM | POA: Insufficient documentation

## 2019-01-30 DIAGNOSIS — Z87891 Personal history of nicotine dependence: Secondary | ICD-10-CM | POA: Diagnosis not present

## 2019-01-30 DIAGNOSIS — E119 Type 2 diabetes mellitus without complications: Secondary | ICD-10-CM | POA: Diagnosis not present

## 2019-01-30 DIAGNOSIS — D1722 Benign lipomatous neoplasm of skin and subcutaneous tissue of left arm: Secondary | ICD-10-CM | POA: Insufficient documentation

## 2019-01-30 LAB — BASIC METABOLIC PANEL
Anion gap: 9 (ref 5–15)
BUN: 18 mg/dL (ref 8–23)
CO2: 26 mmol/L (ref 22–32)
Calcium: 9.6 mg/dL (ref 8.9–10.3)
Chloride: 104 mmol/L (ref 98–111)
Creatinine, Ser: 1.04 mg/dL (ref 0.61–1.24)
GFR calc Af Amer: >60 mL/min (ref >60)
GFR calc non Af Amer: >60 mL/min (ref >60)
Glucose, Bld: 95 mg/dL (ref 70–99)
Potassium: 3.7 mmol/L (ref 3.5–5.1)
Sodium: 139 mmol/L (ref 135–145)

## 2019-01-30 LAB — PSA: Prostatic Specific Antigen: 0.45 ng/mL (ref 0.00–4.00)

## 2019-01-30 LAB — GLUCOSE, CAPILLARY: Glucose-Capillary: 111 mg/dL — ABNORMAL HIGH (ref 70–99)

## 2019-01-31 ENCOUNTER — Other Ambulatory Visit (HOSPITAL_COMMUNITY)
Admission: RE | Admit: 2019-01-31 | Discharge: 2019-01-31 | Disposition: A | Discharge status: Home / Self Care | Payer: Medicare Other | Source: Ambulatory Visit | Attending: General Surgery | Admitting: General Surgery

## 2019-01-31 DIAGNOSIS — Z20828 Contact with and (suspected) exposure to other viral communicable diseases: Secondary | ICD-10-CM | POA: Insufficient documentation

## 2019-01-31 DIAGNOSIS — Z01812 Encounter for preprocedural laboratory examination: Secondary | ICD-10-CM | POA: Diagnosis not present

## 2019-01-31 LAB — HEMOGLOBIN A1C
Hgb A1c MFr Bld: 6.2 % — ABNORMAL HIGH (ref 4.8–5.6)
Mean Plasma Glucose: 131 mg/dL

## 2019-01-31 LAB — SARS CORONAVIRUS 2 (TAT 6-24 HRS): SARS Coronavirus 2: NEGATIVE

## 2019-01-31 NOTE — Pre-Procedure Instructions (Signed)
HgbA1C routed to PCP. 

## 2019-02-01 ENCOUNTER — Ambulatory Visit (HOSPITAL_COMMUNITY)
Admission: RE | Admit: 2019-02-01 | Discharge: 2019-02-01 | Disposition: A | Discharge status: Home / Self Care | Payer: Medicare Other | Attending: General Surgery | Admitting: General Surgery

## 2019-02-01 ENCOUNTER — Encounter (HOSPITAL_COMMUNITY)
Admission: RE | Disposition: A | Discharge status: Home / Self Care | Payer: Self-pay | Source: Home / Self Care | Attending: General Surgery

## 2019-02-01 ENCOUNTER — Encounter (HOSPITAL_COMMUNITY): Payer: Self-pay | Admitting: Anesthesiology

## 2019-02-01 ENCOUNTER — Ambulatory Visit (HOSPITAL_COMMUNITY): Payer: Medicare Other | Admitting: Certified Registered Nurse Anesthetist

## 2019-02-01 DIAGNOSIS — I10 Essential (primary) hypertension: Secondary | ICD-10-CM | POA: Diagnosis not present

## 2019-02-01 DIAGNOSIS — F419 Anxiety disorder, unspecified: Secondary | ICD-10-CM | POA: Insufficient documentation

## 2019-02-01 DIAGNOSIS — E1151 Type 2 diabetes mellitus with diabetic peripheral angiopathy without gangrene: Secondary | ICD-10-CM | POA: Insufficient documentation

## 2019-02-01 DIAGNOSIS — Z9842 Cataract extraction status, left eye: Secondary | ICD-10-CM | POA: Insufficient documentation

## 2019-02-01 DIAGNOSIS — I700 Atherosclerosis of aorta: Secondary | ICD-10-CM | POA: Diagnosis not present

## 2019-02-01 DIAGNOSIS — Z8546 Personal history of malignant neoplasm of prostate: Secondary | ICD-10-CM | POA: Diagnosis not present

## 2019-02-01 DIAGNOSIS — F4310 Post-traumatic stress disorder, unspecified: Secondary | ICD-10-CM | POA: Insufficient documentation

## 2019-02-01 DIAGNOSIS — M2578 Osteophyte, vertebrae: Secondary | ICD-10-CM | POA: Diagnosis not present

## 2019-02-01 DIAGNOSIS — Z8673 Personal history of transient ischemic attack (TIA), and cerebral infarction without residual deficits: Secondary | ICD-10-CM | POA: Insufficient documentation

## 2019-02-01 DIAGNOSIS — Z79899 Other long term (current) drug therapy: Secondary | ICD-10-CM | POA: Diagnosis not present

## 2019-02-01 DIAGNOSIS — Z800 Family history of malignant neoplasm of digestive organs: Secondary | ICD-10-CM | POA: Diagnosis not present

## 2019-02-01 DIAGNOSIS — M4802 Spinal stenosis, cervical region: Secondary | ICD-10-CM | POA: Insufficient documentation

## 2019-02-01 DIAGNOSIS — Z833 Family history of diabetes mellitus: Secondary | ICD-10-CM | POA: Diagnosis not present

## 2019-02-01 DIAGNOSIS — M5032 Other cervical disc degeneration, mid-cervical region, unspecified level: Secondary | ICD-10-CM | POA: Insufficient documentation

## 2019-02-01 DIAGNOSIS — E119 Type 2 diabetes mellitus without complications: Secondary | ICD-10-CM | POA: Diagnosis not present

## 2019-02-01 DIAGNOSIS — Z7982 Long term (current) use of aspirin: Secondary | ICD-10-CM | POA: Diagnosis not present

## 2019-02-01 DIAGNOSIS — D1722 Benign lipomatous neoplasm of skin and subcutaneous tissue of left arm: Secondary | ICD-10-CM | POA: Diagnosis not present

## 2019-02-01 DIAGNOSIS — Z791 Long term (current) use of non-steroidal anti-inflammatories (NSAID): Secondary | ICD-10-CM | POA: Insufficient documentation

## 2019-02-01 DIAGNOSIS — G4730 Sleep apnea, unspecified: Secondary | ICD-10-CM | POA: Diagnosis not present

## 2019-02-01 DIAGNOSIS — Z86010 Personal history of colonic polyps: Secondary | ICD-10-CM | POA: Diagnosis not present

## 2019-02-01 DIAGNOSIS — Z8249 Family history of ischemic heart disease and other diseases of the circulatory system: Secondary | ICD-10-CM | POA: Diagnosis not present

## 2019-02-01 DIAGNOSIS — Z880 Allergy status to penicillin: Secondary | ICD-10-CM | POA: Insufficient documentation

## 2019-02-01 DIAGNOSIS — Z87891 Personal history of nicotine dependence: Secondary | ICD-10-CM | POA: Insufficient documentation

## 2019-02-01 DIAGNOSIS — M1990 Unspecified osteoarthritis, unspecified site: Secondary | ICD-10-CM | POA: Insufficient documentation

## 2019-02-01 DIAGNOSIS — Z9841 Cataract extraction status, right eye: Secondary | ICD-10-CM | POA: Diagnosis not present

## 2019-02-01 DIAGNOSIS — Z961 Presence of intraocular lens: Secondary | ICD-10-CM | POA: Diagnosis not present

## 2019-02-01 HISTORY — PX: LIPOMA EXCISION: SHX5283

## 2019-02-01 LAB — GLUCOSE, CAPILLARY: Glucose-Capillary: 110 mg/dL — ABNORMAL HIGH (ref 70–99)

## 2019-02-01 SURGERY — EXCISION LIPOMA
Anesthesia: General | Site: Arm Upper | Laterality: Left

## 2019-02-01 MED ORDER — LIDOCAINE HCL (PF) 1 % IJ SOLN
INTRAMUSCULAR | Status: DC | PRN
  Administered 2019-02-01: 5 mL

## 2019-02-01 MED ORDER — ONDANSETRON HCL 4 MG/2ML IJ SOLN
INTRAMUSCULAR | Status: DC | PRN
  Administered 2019-02-01: 4 mg via INTRAVENOUS

## 2019-02-01 MED ORDER — LACTATED RINGERS IV SOLN
INTRAVENOUS | Status: DC | PRN
  Administered 2019-02-01: 09:00:00 via INTRAVENOUS

## 2019-02-01 MED ORDER — 0.9 % SODIUM CHLORIDE (POUR BTL) OPTIME
TOPICAL | Status: DC | PRN
  Administered 2019-02-01: 10:00:00 1000 mL

## 2019-02-01 MED ORDER — KETOROLAC TROMETHAMINE 30 MG/ML IJ SOLN
15.0000 mg | Freq: Once | INTRAMUSCULAR | Status: AC
  Administered 2019-02-01: 10:00:00 15 mg via INTRAVENOUS
  Filled 2019-02-01: qty 1

## 2019-02-01 MED ORDER — BUPIVACAINE HCL (PF) 0.5 % IJ SOLN
INTRAMUSCULAR | Status: AC
  Filled 2019-02-01: qty 30

## 2019-02-01 MED ORDER — PROPOFOL 10 MG/ML IV BOLUS
INTRAVENOUS | Status: DC | PRN
  Administered 2019-02-01 (×3): 20 mg via INTRAVENOUS

## 2019-02-01 MED ORDER — FENTANYL CITRATE (PF) 100 MCG/2ML IJ SOLN
INTRAMUSCULAR | Status: DC | PRN
  Administered 2019-02-01: 25 ug via INTRAVENOUS

## 2019-02-01 MED ORDER — LACTATED RINGERS IV SOLN
Freq: Once | INTRAVENOUS | Status: AC
  Administered 2019-02-01: 08:00:00 via INTRAVENOUS

## 2019-02-01 MED ORDER — FENTANYL CITRATE (PF) 100 MCG/2ML IJ SOLN: 25.0000 ug | INTRAMUSCULAR | Status: DC | PRN

## 2019-02-01 MED ORDER — ONDANSETRON HCL 4 MG/2ML IJ SOLN: 4.0000 mg | Freq: Once | INTRAMUSCULAR | Status: DC | PRN

## 2019-02-01 MED ORDER — CHLORHEXIDINE GLUCONATE CLOTH 2 % EX PADS: 6.0000 | MEDICATED_PAD | Freq: Once | CUTANEOUS | Status: DC

## 2019-02-01 MED ORDER — MIDAZOLAM HCL 5 MG/5ML IJ SOLN
INTRAMUSCULAR | Status: DC | PRN
  Administered 2019-02-01: 1 mg via INTRAVENOUS

## 2019-02-01 MED ORDER — LIDOCAINE HCL (PF) 1 % IJ SOLN
INTRAMUSCULAR | Status: AC
  Filled 2019-02-01: qty 30

## 2019-02-01 MED ORDER — ONDANSETRON HCL 4 MG/2ML IJ SOLN
INTRAMUSCULAR | Status: AC
  Filled 2019-02-01: qty 2

## 2019-02-01 MED ORDER — FENTANYL CITRATE (PF) 100 MCG/2ML IJ SOLN
INTRAMUSCULAR | Status: AC
  Filled 2019-02-01: qty 2

## 2019-02-01 MED ORDER — PROPOFOL 500 MG/50ML IV EMUL
INTRAVENOUS | Status: DC | PRN
  Administered 2019-02-01: 100 ug/kg/min via INTRAVENOUS

## 2019-02-01 MED ORDER — MEPERIDINE HCL 50 MG/ML IJ SOLN: 6.2500 mg | INTRAMUSCULAR | Status: DC | PRN

## 2019-02-01 MED ORDER — PROPOFOL 10 MG/ML IV BOLUS
INTRAVENOUS | Status: AC
  Filled 2019-02-01: qty 20

## 2019-02-01 MED ORDER — POVIDONE-IODINE 10 % EX OINT
TOPICAL_OINTMENT | CUTANEOUS | Status: AC
  Filled 2019-02-01: qty 1

## 2019-02-01 MED ORDER — MIDAZOLAM HCL 2 MG/2ML IJ SOLN
INTRAMUSCULAR | Status: AC
  Filled 2019-02-01: qty 2

## 2019-02-01 SURGICAL SUPPLY — 29 items
APPLICATOR CHLORAPREP 10.5 ORG (MISCELLANEOUS) ×3 IMPLANT
CHLORAPREP W/TINT 26 (MISCELLANEOUS) ×3 IMPLANT
CLOTH BEACON ORANGE TIMEOUT ST (SAFETY) ×3 IMPLANT
COVER LIGHT HANDLE STERIS (MISCELLANEOUS) ×6 IMPLANT
COVER WAND RF STERILE (DRAPES) ×3 IMPLANT
DECANTER SPIKE VIAL GLASS SM (MISCELLANEOUS) ×6 IMPLANT
DERMABOND ADVANCED (GAUZE/BANDAGES/DRESSINGS) ×2
DERMABOND ADVANCED .7 DNX12 (GAUZE/BANDAGES/DRESSINGS) ×1 IMPLANT
DRAPE HALF SHEET 40X57 (DRAPES) ×3 IMPLANT
ELECT REM PT RETURN 9FT ADLT (ELECTROSURGICAL) ×3
ELECTRODE REM PT RTRN 9FT ADLT (ELECTROSURGICAL) ×1 IMPLANT
GLOVE BIOGEL PI IND STRL 7.0 (GLOVE) ×1 IMPLANT
GLOVE BIOGEL PI INDICATOR 7.0 (GLOVE) ×2
GLOVE SURG SS PI 7.5 STRL IVOR (GLOVE) ×6 IMPLANT
GOWN STRL REUS W/ TWL XL LVL3 (GOWN DISPOSABLE) ×1 IMPLANT
GOWN STRL REUS W/TWL LRG LVL3 (GOWN DISPOSABLE) ×6 IMPLANT
GOWN STRL REUS W/TWL XL LVL3 (GOWN DISPOSABLE) ×2
KIT TURNOVER KIT A (KITS) ×3 IMPLANT
MANIFOLD NEPTUNE II (INSTRUMENTS) ×3 IMPLANT
NEEDLE HYPO 25X1 1.5 SAFETY (NEEDLE) ×3 IMPLANT
NS IRRIG 1000ML POUR BTL (IV SOLUTION) ×3 IMPLANT
PACK MINOR (CUSTOM PROCEDURE TRAY) ×3 IMPLANT
PAD ARMBOARD 7.5X6 YLW CONV (MISCELLANEOUS) ×3 IMPLANT
SET BASIN LINEN APH (SET/KITS/TRAYS/PACK) ×3 IMPLANT
SUT MNCRL AB 4-0 PS2 18 (SUTURE) ×3 IMPLANT
SUT VIC AB 3-0 SH 27 (SUTURE) ×2
SUT VIC AB 3-0 SH 27X BRD (SUTURE) ×1 IMPLANT
SYR CONTROL 10ML LL (SYRINGE) ×3 IMPLANT
TOWEL OR 17X26 4PK STRL BLUE (TOWEL DISPOSABLE) ×3 IMPLANT

## 2019-02-01 NOTE — Interval H&P Note (Signed)
History and Physical Interval Note:  02/01/2019 8:33 AM  Robert Zhang  has presented today for surgery, with the diagnosis of lipoma left arm.  The various methods of treatment have been discussed with the patient and family. After consideration of risks, benefits and other options for treatment, the patient has consented to  Procedure(s): EXCISION LIPOMA 5CM LEFT ARM (Left) as a surgical intervention.  The patient's history has been reviewed, patient examined, no change in status, stable for surgery.  I have reviewed the patient's chart and labs.  Questions were answered to the patient's satisfaction.     [facility] Signs

## 2019-02-01 NOTE — Anesthesia Preprocedure Evaluation (Addendum)
Anesthesia Evaluation  Patient identified by MRN, date of birth, ID band Patient awake    Reviewed: Allergy & Precautions, NPO status , Patient's Chart, lab work & pertinent test results  Airway Mallampati: II  TM Distance: >3 FB Neck ROM: Full    Dental  (+) Missing   Pulmonary sleep apnea , former smoker,    Pulmonary exam normal breath sounds clear to auscultation       Cardiovascular hypertension, Pt. on medications + Peripheral Vascular Disease (Right carotid occlusion)  Normal cardiovascular exam Rhythm:Regular Rate:Normal  SHERROD, TOOTHMAN [id number] 30-Jan-2019 13:55:53 [facility]300 ROUTINE RECORD Normal sinus rhythm Low voltage QRS Septal infarct , age undetermined Abnormal ECG 69m/s 168mmV _0  9.0.4 12SL 241 HD CID: [id number] Referred by: [facility] Signs Neuro/Psych PSYCHIATRIC DISORDERS Anxiety PTSDCVA, No Residual Symptoms    GI/Hepatic negative GI ROS, (+) Hepatitis - (patient denied )  Endo/Other  diabetes (Diet controlled), Well Controlled, Type 2  Renal/GU      Musculoskeletal  (+) Arthritis , Osteoarthritis,  Cervical spinal stenosis Right shoulder pain Back pain CERVICAL MYELOGRAM FINDINGS:  Multilevel disc disease is again noted. There is severe right foraminal stenosis at C3-4 and C4-5. Moderate disease is present at C5-6 and C6-7 on the right.  Severe left-sided disease is present at C4-5 and C5-6.  Lateral imaging demonstrates a severe disc osteophyte complex at C4-5, C5-6, and C6-7. More mild central disc herniation is present at C3-4.  Upright imaging demonstrates no significant change in chronic loss of disc height at C5-6 and C6-7. There is no abnormal motion with flexion or extension.    Abdominal   Peds  Hematology Prostate cancer   Anesthesia Other Findings   Reproductive/Obstetrics                            Anesthesia  Physical Anesthesia Plan  ASA: III  Anesthesia Plan: MAC   Post-op Pain Management:    Induction: Intravenous  PONV Risk Score and Plan:   Airway Management Planned:   Additional Equipment:   Intra-op Plan:   Post-operative Plan:   Informed Consent: I have reviewed the patients History and Physical, chart, labs and discussed the procedure including the risks, benefits and alternatives for the proposed anesthesia with the patient or authorized representative who has indicated his/her understanding and acceptance.     Dental advisory given  Plan Discussed with: CRNA  Anesthesia Plan Comments:       Anesthesia Quick Evaluation

## 2019-02-01 NOTE — Discharge Instructions (Signed)
Lipoma Removal, Care After Refer to this sheet in the next few weeks. These instructions provide you with information about caring for yourself after your procedure. Your health care provider may also give you more specific instructions. Your treatment has been planned according to current medical practices, but problems sometimes occur. Call your health care provider if you have any problems or questions after your procedure. What can I expect after the procedure? After the procedure, it is common to have:  Mild pain.  Swelling.  Bruising. Follow these instructions at home:  Bathing  Do not take baths, swim, or use a hot tub until your health care provider approves. Ask your health care provider if you can take showers. You may only be allowed to take sponge baths for bathing.  Keep your bandage (dressing) dry until your health care provider says it can be removed. Incision care   Follow instructions from your health care provider about how to take care of your incision. Make sure you: ? Wash your hands with soap and water before you change your bandage (dressing). If soap and water are not available, use hand sanitizer. ? Change your dressing as told by your health care provider. ? Leave stitches (sutures), skin glue, or adhesive strips in place. These skin closures may need to stay in place for 2 weeks or longer. If adhesive strip edges start to loosen and curl up, you may trim the loose edges. Do not remove adhesive strips completely unless your health care provider tells you to do that.  Check your incision area every day for signs of infection. Check for: ? More redness, swelling, or pain. ? Fluid or blood. ? Warmth. ? Pus or a bad smell. Driving  Do not drive or operate heavy machinery while taking prescription pain medicine.  Do not drive for 24 hours if you received a medicine to help you relax (sedative) during your procedure.  Ask your health care provider when it is  safe for you to drive. General instructions  Take over-the-counter and prescription medicines only as told by your health care provider.  Do not use any tobacco products, such as cigarettes, chewing tobacco, and e-cigarettes. These can delay healing. If you need help quitting, ask your health care provider.  Return to your normal activities as told by your health care provider. Ask your health care provider what activities are safe for you.  Keep all follow-up visits as told by your health care provider. This is important. Contact a health care provider if:  You have more redness, swelling, or pain around your incision.  You have fluid or blood coming from your incision.  Your incision feels warm to the touch.  You have pus or a bad smell coming from your incision.  You have pain that does not get better with medicine. Get help right away if:  You have chills or a fever.  You have severe pain. This information is not intended to replace advice given to you by your health care provider. Make sure you discuss any questions you have with your health care provider. Document Released: 07/18/2015 Document Revised: 12/26/2015 Document Reviewed: 07/18/2015 Elsevier Patient Education  2020 [employer].

## 2019-02-01 NOTE — Transfer of Care (Signed)
Immediate Anesthesia Transfer of Care Note  Patient: Robert Zhang  Procedure(s) Performed: EXCISION LIPOMA LEFT UPPER EXTREMITY (Left Arm Upper)  Patient Location: PACU  Anesthesia Type:MAC  Level of Consciousness: awake and patient cooperative  Airway & Oxygen Therapy: Patient Spontanous Breathing  Post-op Assessment: Report given to RN and Post -op Vital signs reviewed and stable  Post vital signs: Reviewed and stable  Last Vitals:  Vitals Value Taken Time  BP    Temp    Pulse 72 02/01/19 0959  Resp 13 02/01/19 0959  SpO2 97 % 02/01/19 0959  Vitals shown include unvalidated device data.  Last Pain:  Vitals:   02/01/19 0740  PainSc: 0-No pain         Complications: No apparent anesthesia complications

## 2019-02-01 NOTE — Op Note (Signed)
Patient:  YU PEGGS  DOB:  11-01-1945  MRN:  [id number]   Preop Diagnosis: Lipoma, left arm  Postop Diagnosis:  Same   Procedure: Excision of lipoma, left arm  Surgeon: Aviva Signs, MD  Anes: MAC  Indications: Patient is a 73 year old white male who has a subcutaneous mass in the left antecubital fossa which appears to be a lipoma.  It is causing him some difficulty with range of motion of the left elbow.  The risks and benefits of the procedure including bleeding, infection, and the possibility of recurrence of the mass were fully explained to the patient, who gave informed consent.  Procedure note: The patient was placed in supine position.  After monitored anesthesia care was given, the left arm was prepped and draped using the usual sterile technique with ChloraPrep.  Surgical site confirmation was performed.  1% Xylocaine was used for local anesthesia.  A longitudinal incision was made over the mass which was along the medial aspect of the left antecubital fossa.  The dissection was taken down and it was noted the lipoma was interdigitating within the musculature.  The fascia was incised and the lipoma was extruded without difficulty.  It measured greater than 4 cm in its greatest diameter.  It was sent to pathology further examination.  A bleeding was controlled using Bovie electrocautery.  The subcutaneous layer was reapproximated using 3-0 Vicryl interrupted suture.  The skin was closed using a 4-0 Monocryl subcuticular suture.  Dermabond was applied.  All tape and needle counts were correct at the end of the procedure.  The patient was awakened and transferred to PACU in stable condition.  Complications: None  EBL: Minimal  Specimen: Lipoma, left arm

## 2019-02-01 NOTE — Anesthesia Postprocedure Evaluation (Signed)
Anesthesia Post Note  Patient: Robert Zhang  Procedure(s) Performed: EXCISION LIPOMA LEFT UPPER EXTREMITY (Left Arm Upper)  Patient location during evaluation: PACU Anesthesia Type: General Level of consciousness: awake and alert and oriented Pain management: pain level controlled Vital Signs Assessment: post-procedure vital signs reviewed and stable Respiratory status: spontaneous breathing Cardiovascular status: blood pressure returned to baseline and stable Postop Assessment: no apparent nausea or vomiting Anesthetic complications: no Comments: Late entry     Last Vitals:  Vitals:   02/01/19 1015 02/01/19 1028  BP: 134/66 (!) 146/82  Pulse: 62 62  Resp: 19 18  Temp:  36.6 C  SpO2: 97% 97%    Last Pain:  Vitals:   02/01/19 1028  TempSrc: Oral  PainSc: 0-No pain                 Candelario Steppe

## 2019-02-02 ENCOUNTER — Encounter (HOSPITAL_COMMUNITY): Payer: Self-pay | Admitting: General Surgery

## 2019-02-02 LAB — SURGICAL PATHOLOGY

## 2019-02-02 NOTE — Addendum Note (Signed)
Addendum  created 02/02/19 0802 by Mickel Baas, CRNA   Charge Capture section accepted

## 2019-02-08 ENCOUNTER — Ambulatory Visit (INDEPENDENT_AMBULATORY_CARE_PROVIDER_SITE_OTHER): Payer: Medicare Other | Admitting: Urology

## 2019-02-08 DIAGNOSIS — C61 Malignant neoplasm of prostate: Secondary | ICD-10-CM | POA: Diagnosis not present

## 2019-02-08 DIAGNOSIS — N401 Enlarged prostate with lower urinary tract symptoms: Secondary | ICD-10-CM | POA: Diagnosis not present

## 2019-02-08 DIAGNOSIS — R351 Nocturia: Secondary | ICD-10-CM

## 2019-02-09 ENCOUNTER — Telehealth (INDEPENDENT_AMBULATORY_CARE_PROVIDER_SITE_OTHER): Payer: Self-pay | Admitting: General Surgery

## 2019-02-09 ENCOUNTER — Telehealth: Payer: Self-pay | Admitting: General Surgery

## 2019-02-09 ENCOUNTER — Other Ambulatory Visit: Payer: Self-pay

## 2019-02-09 DIAGNOSIS — Z09 Encounter for follow-up examination after completed treatment for conditions other than malignant neoplasm: Secondary | ICD-10-CM

## 2019-02-09 NOTE — Telephone Encounter (Signed)
Virtual visit performed.  Patient's wife states he is doing well.  He has no complaints.  Final pathology showed a lipoma.  They were instructed to call me should any problems arise.

## 2019-02-22 DIAGNOSIS — R6889 Other general symptoms and signs: Secondary | ICD-10-CM | POA: Diagnosis not present

## 2019-03-14 DIAGNOSIS — Z23 Encounter for immunization: Secondary | ICD-10-CM | POA: Diagnosis not present

## 2019-03-18 DIAGNOSIS — E119 Type 2 diabetes mellitus without complications: Secondary | ICD-10-CM | POA: Diagnosis not present

## 2019-03-18 DIAGNOSIS — I10 Essential (primary) hypertension: Secondary | ICD-10-CM | POA: Diagnosis not present

## 2019-03-18 DIAGNOSIS — E7849 Other hyperlipidemia: Secondary | ICD-10-CM | POA: Diagnosis not present

## 2019-04-03 DIAGNOSIS — Z1211 Encounter for screening for malignant neoplasm of colon: Secondary | ICD-10-CM | POA: Diagnosis not present

## 2019-05-05 ENCOUNTER — Other Ambulatory Visit: Payer: Self-pay

## 2019-05-05 DIAGNOSIS — C61 Malignant neoplasm of prostate: Secondary | ICD-10-CM

## 2019-05-18 DIAGNOSIS — E119 Type 2 diabetes mellitus without complications: Secondary | ICD-10-CM | POA: Diagnosis not present

## 2019-05-18 DIAGNOSIS — I10 Essential (primary) hypertension: Secondary | ICD-10-CM | POA: Diagnosis not present

## 2019-07-11 DIAGNOSIS — E119 Type 2 diabetes mellitus without complications: Secondary | ICD-10-CM | POA: Diagnosis not present

## 2019-07-11 DIAGNOSIS — H524 Presbyopia: Secondary | ICD-10-CM | POA: Diagnosis not present

## 2019-07-18 ENCOUNTER — Other Ambulatory Visit: Payer: Self-pay | Admitting: Urology

## 2019-07-24 DIAGNOSIS — E119 Type 2 diabetes mellitus without complications: Secondary | ICD-10-CM | POA: Diagnosis not present

## 2019-07-24 DIAGNOSIS — I10 Essential (primary) hypertension: Secondary | ICD-10-CM | POA: Diagnosis not present

## 2019-07-24 DIAGNOSIS — R7309 Other abnormal glucose: Secondary | ICD-10-CM | POA: Diagnosis not present

## 2019-07-24 DIAGNOSIS — M1000 Idiopathic gout, unspecified site: Secondary | ICD-10-CM | POA: Diagnosis not present

## 2019-07-26 ENCOUNTER — Other Ambulatory Visit: Payer: Self-pay

## 2019-07-26 ENCOUNTER — Encounter: Payer: Self-pay | Admitting: Urology

## 2019-07-26 ENCOUNTER — Ambulatory Visit (INDEPENDENT_AMBULATORY_CARE_PROVIDER_SITE_OTHER): Payer: Medicare Other | Admitting: Urology

## 2019-07-26 VITALS — BP 150/88 | HR 103 | Temp 97.5°F | Ht 68.0 in | Wt 178.0 lb

## 2019-07-26 DIAGNOSIS — N401 Enlarged prostate with lower urinary tract symptoms: Secondary | ICD-10-CM | POA: Diagnosis not present

## 2019-07-26 DIAGNOSIS — C61 Malignant neoplasm of prostate: Secondary | ICD-10-CM

## 2019-07-26 DIAGNOSIS — R351 Nocturia: Secondary | ICD-10-CM | POA: Insufficient documentation

## 2019-07-26 DIAGNOSIS — N138 Other obstructive and reflux uropathy: Secondary | ICD-10-CM

## 2019-07-26 MED ORDER — TAMSULOSIN HCL 0.4 MG PO CAPS: 0.4000 mg | ORAL_CAPSULE | Freq: Every day | ORAL | 3 refills | Status: DC

## 2019-07-26 NOTE — Progress Notes (Signed)
07/26/2019 10:01 AM   Robert Zhang 03-25-1946 [id number]  Referring provider: Sharilyn Sites, MD [address] [place],  [place] [address]  Nocturia  HPI: Robert Zhang is a 74yo here for followuop for BPH with LUTS, nocturia, and prostate cancer.  PSA is stable at 0.4. He has stable nocturia 3x, urinary frequency, urinary urgency. He is currently flomax. He stopped mirabegron. He is not interested in an additional medication. No hematuria. Occasional dysuria. He feels he is emptying his bladder well.  His records from [facility] are as follows: <HTML><META HTTP-EQUIV="content-type" CONTENT="text/html;charset=utf-8"><B>06/10/2016: Prostate biopsy showed Gleason 3+3=6 in 3/12 cores 5-20% of core. <BR><BR>09/16/2016: PSa has increased to 9.5 from 7.2. He has moderate LUTS and is not on medical therapy for BPH. <BR><BR>11/25/2016: PSA has decreased to 7.9 <BR><BR>03/03/2017: PSA is stable at 7.7. No new LUTS  09/22/2017: PSa increased to 10.1. We discussed repeating the prostate biopsy versus staging imaging. 05/04/2018: PSA decreased to 1.7 08/03/2018: PSA decreased to 0.8 02/08/2019: PSA decreased to 3.45    PMH: Past Medical History:  Diagnosis Date  . Aortic atherosclerosis ([place]) 10/12/2017   Noted on CT   . Arthritis    bilateral legs /   Neck-Degenerative Disc Disease  . Carotid artery occlusion    Right Carotid   . Cervical spondylosis   . Cervical stenosis of spine   . Degenerative disc disease, cervical    C4-7  . Diabetes mellitus without complication ([place])    diet and exercise controlled, no medications, hgb A1C 07/31/2017 (6.9)  . External hemorrhoids   . Hepatic cyst 10/12/2017   Low density lession right hepatic lobe, noted on CT  . Hepatitis    Hepatitis C negative 12/05/2014  . History of colon polyps   . Hypertension   . Leg cramps   . Prostate CA ([place])   . Protrusion of intervertebral disc of lumbosacral region    L5-S1  . PTSD (post-traumatic stress disorder)   . PTSD  (post-traumatic stress disorder)   . Pulmonary nodule 11/26/2016   63mm subpleural right lower lobe, noted on CT ABD/PELVIS  . Right shoulder tendinitis   . Sleep apnea    Stop Bang score of 5  . Stroke [facility]) 2016   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    Surgical History: Past Surgical History:  Procedure Laterality Date  . CATARACT EXTRACTION W/PHACO  07/20/2011   Procedure: CATARACT EXTRACTION PHACO AND INTRAOCULAR LENS PLACEMENT (IOC);  Surgeon: Williams Che, MD;  Location: [facility];  Service: Ophthalmology;  Laterality: Right;  CDE=25.73  . CATARACT EXTRACTION W/PHACO  08/31/2011   Procedure: CATARACT EXTRACTION PHACO AND INTRAOCULAR LENS PLACEMENT (IOC);  Surgeon: Williams Che, MD;  Location: [facility];  Service: Ophthalmology;  Laterality: Left;  CDE: 38.59  . COLONOSCOPY N/A 09/10/2016   Procedure: COLONOSCOPY;  Surgeon: Rogene Houston, MD;  Location: [facility];  Service: Endoscopy;  Laterality: N/A;  1200  . COLONOSCOPY W/ POLYPECTOMY  2011   [facility]-Dr. Rehman  . cortisone injection Left 01/13/2016   left shoulder  . cortisone injection Left 09/21/2017   left shoulder  . CYSTOSCOPY  01/20/2018   Procedure: CYSTOSCOPY FLEXIBLE;  Surgeon: Cleon Gustin, MD;  Location: [facility];  Service: Urology;;  no seeds found in bladder  . EYE SURGERY     laser surgery to repair retina tare  . head reconstruction  1966   due to head injury in war  . LIPOMA  EXCISION Left 02/01/2019   Procedure: EXCISION LIPOMA LEFT UPPER EXTREMITY;  Surgeon: Aviva Signs, MD;  Location: [facility];  Service: General;  Laterality: Left;  . PROSTATE BIOPSY  05/20/2016  . RADIOACTIVE SEED IMPLANT N/A 01/20/2018   Procedure: RADIOACTIVE SEED IMPLANT/BRACHYTHERAPY IMPLANT;  Surgeon: Cleon Gustin, MD;  Location: [facility];  Service: Urology;  Laterality: N/A;     88    seeds implanted  . REFRACTIVE SURGERY Left 03/05/2017   to resolve cloudiness   . SPACE OAR INSTILLATION N/A 01/20/2018   Procedure: SPACE OAR INSTILLATION;  Surgeon: Cleon Gustin, MD;  Location: [facility];  Service: Urology;  Laterality: N/A;  . TRIGGER FINGER RELEASE      Home Medications:  Allergies as of 07/26/2019      Reactions   Penicillins Rash, Other (See Comments)   Childhood allergy Has patient had a PCN reaction causing immediate rash, facial/tongue/throat swelling, SOB or lightheadedness with hypotension: yes Has patient had a PCN reaction causing severe rash involving mucus membranes or skin necrosis: no Has patient had a PCN reaction that required hospitalization no Has patient had a PCN reaction occurring within the last 10 years: no If all of the above answers are "NO", then may proceed with Cephalosporin use.      Medication List       Accurate as of July 26, 2019 10:01 AM. If you have any questions, ask your nurse or doctor.        aspirin 81 MG tablet Take 1 tablet (81 mg total) by mouth daily.   losartan-hydrochlorothiazide 50-12.5 MG tablet Commonly known as: HYZAAR Take 1 tablet by mouth daily.   naproxen 500 MG tablet Commonly known as: NAPROSYN Take 250 mg by mouth daily.   ONE TOUCH ULTRA TEST test strip Generic drug: glucose blood   onetouch ultrasoft lancets   potassium chloride SA 20 MEQ tablet Commonly known as: KLOR-CON Take 20 mEq by mouth daily as needed (cramping).   QC Tumeric Complex 500 MG Caps Generic drug: Turmeric Take by mouth.   tamsulosin 0.4 MG Caps capsule Commonly known as: FLOMAX Take 0.4 mg by mouth at bedtime.   Vitamin D3 125 MCG (5000 UT) Caps Take 5,000 Units by mouth daily.   vitamin E 1000 UNIT capsule Take 1,000 Units by mouth daily.       Allergies:  Allergies  Allergen Reactions  . Penicillins Rash and Other (See Comments)    Childhood allergy Has patient had a PCN reaction causing immediate rash, facial/tongue/throat swelling, SOB or lightheadedness  with hypotension: yes Has patient had a PCN reaction causing severe rash involving mucus membranes or skin necrosis: no Has patient had a PCN reaction that required hospitalization no Has patient had a PCN reaction occurring within the last 10 years: no If all of the above answers are "NO", then may proceed with Cephalosporin use.      Family History: Family History  Problem Relation Age of Onset  . Diabetes Mother   . Hypertension Father   . Heart disease Father        before age 81  . Heart attack Father   . Pancreatic cancer Brother   . Anesthesia problems Neg Hx   . Hypotension Neg Hx   . Malignant hyperthermia Neg Hx   . Pseudochol deficiency Neg Hx     Social History:  reports that he quit smoking about 43 years ago. His smoking use included cigarettes. He has a  13.00 pack-year smoking history. He has never used smokeless tobacco. He reports current alcohol use of about 4.0 - 6.0 standard drinks of alcohol per week. He reports that he does not use drugs.  ROS: All other review of systems were reviewed and are negative except what is noted above in HPI  Physical Exam: BP (!) 150/88   Pulse (!) 103   Temp (!) 97.5 F (36.4 C)   Ht 5\' 8"  (1.727 m)   Wt 178 lb (80.7 kg)   BMI 27.06 kg/m   Constitutional:  Alert and oriented, No acute distress. HEENT: [place] AT, moist mucus membranes.  Trachea midline, no masses. Cardiovascular: No clubbing, cyanosis, or edema. Respiratory: Normal respiratory effort, no increased work of breathing. GI: Abdomen is soft, nontender, nondistended, no abdominal masses GU: No CVA tenderness Lymph: No cervical or inguinal lymphadenopathy. Skin: No rashes, bruises or suspicious lesions. Neurologic: Grossly intact, no focal deficits, moving all 4 extremities. Psychiatric: Normal mood and affect.  Laboratory Data: Lab Results  Component Value Date   WBC 6.0 01/13/2018   HGB 16.5 01/13/2018   HCT 48.5 01/13/2018   MCV 91.7 01/13/2018   PLT  171 01/13/2018    Lab Results  Component Value Date   CREATININE 1.04 01/30/2019    No results found for: PSA  No results found for: TESTOSTERONE  Lab Results  Component Value Date   HGBA1C 6.2 (H) 01/30/2019    Urinalysis No results found for: COLORURINE, APPEARANCEUR, LABSPEC, PHURINE, GLUCOSEU, HGBUR, BILIRUBINUR, KETONESUR, PROTEINUR, UROBILINOGEN, NITRITE, LEUKOCYTESUR  No results found for: LABMICR, [place], RBCUA, LABEPIT, MUCUS, BACTERIA  Pertinent Imaging:  No results found for this or any previous visit. No results found for this or any previous visit. No results found for this or any previous visit. No results found for this or any previous visit. No results found for this or any previous visit. No results found for this or any previous visit. No results found for this or any previous visit. No results found for this or any previous visit.  Assessment & Plan:    1. Benign prostatic hyperplasia with urinary obstruction -Continue flomax 0.4mg    2. Nocturia -FLomax 0.4mg  daily  3. Prostate cancer ([place]) RTC 6 months with PSA and DRE   Return in about 6 months (around 01/26/2020).  Nicolette Bang, MD  [facility] Urology [place]

## 2019-07-26 NOTE — Progress Notes (Signed)
Urological Symptom Review  Patient is experiencing the following symptoms: Frequent urination Hard to postpone urination Burning/pain with urination Get up at night to urinate Leakage of urine Stream starts and stops Weak stream Erection problems (male only)   Review of Systems  Gastrointestinal (upper)  : Negative for upper GI symptoms  Gastrointestinal (lower) : Negative for lower GI symptoms  Constitutional : Negative for symptoms  Skin: Negative for skin symptoms  Eyes: Negative for eye symptoms  Ear/Nose/Throat : Negative for Ear/Nose/Throat symptoms  Hematologic/Lymphatic: Easy bruising  Cardiovascular : Negative for cardiovascular symptoms  Respiratory : Negative for respiratory symptoms  Endocrine: Negative for endocrine symptoms  Musculoskeletal: Back pain Joint pain  Neurological: Negative for neurological symptoms  Psychologic: Negative for psychiatric symptoms

## 2019-07-26 NOTE — Patient Instructions (Signed)
Prostate Cancer  The prostate is a male gland that helps make semen. Prostate cancer is when abnormal cells grow in this gland. Follow these instructions at home:  Take over-the-counter and prescription medicines only as told by your doctor.  Eat a healthy diet.  Get plenty of sleep.  Ask your doctor for help to find a support group for men with prostate cancer.  Keep all follow-up visits as told by your doctor. This is important.  If you have to go to the hospital, let your cancer doctor (oncologist) know.  Touch, hold, hug, and caress your partner to continue to show sexual feelings. Contact a doctor if:  You have trouble peeing (urinating).  You have blood in your pee (urine).  You have pain in your hips, back, or chest. Get help right away if:  You have weakness in your legs.  You lose feeling (have numbness) in your legs.  You cannot control your pee or your poop (stool).  You have trouble breathing.  You have sudden pain in your chest.  You have chills or a fever. Summary  The prostate is a male gland that helps make semen. Prostate cancer is when abnormal cells grow in this gland.  Ask your doctor for help to find a support group for men with prostate cancer.  Contact a doctor if you have problems peeing or have any new pain that you did not have before. This information is not intended to replace advice given to you by your health care provider. Make sure you discuss any questions you have with your health care provider. Document Revised: 04/16/2017 Document Reviewed: 01/13/2016 Elsevier Patient Education  2020 [employer]  

## 2019-08-07 ENCOUNTER — Ambulatory Visit: Payer: Medicare Other | Admitting: Surgery

## 2019-08-07 ENCOUNTER — Encounter (HOSPITAL_COMMUNITY): Payer: Medicare Other

## 2019-08-16 DIAGNOSIS — E119 Type 2 diabetes mellitus without complications: Secondary | ICD-10-CM | POA: Diagnosis not present

## 2019-08-16 DIAGNOSIS — I10 Essential (primary) hypertension: Secondary | ICD-10-CM | POA: Diagnosis not present

## 2019-08-17 ENCOUNTER — Other Ambulatory Visit: Payer: Self-pay | Admitting: *Deleted

## 2019-08-17 ENCOUNTER — Telehealth (HOSPITAL_COMMUNITY): Payer: Self-pay

## 2019-08-17 DIAGNOSIS — I6521 Occlusion and stenosis of right carotid artery: Secondary | ICD-10-CM

## 2019-08-17 NOTE — Telephone Encounter (Signed)
The above patient or their representative was contacted and gave the following answers to these questions:         Do you have any of the following symptoms?    NO  Fever                    Cough                   Shortness of breath  Do  you have any of the following other symptoms?    muscle pain         vomiting,        diarrhea        rash         weakness        red eye        abdominal pain         bruising          bruising or bleeding              joint pain           severe headache    Have you been in contact with someone who was or has been sick in the past 2 weeks?  NO  Yes                 Unsure                         Unable to assess   Does the person that you were in contact with have any of the following symptoms?   Cough         shortness of breath           muscle pain         vomiting,            diarrhea            rash            weakness           fever            red eye           abdominal pain           bruising  or  bleeding                joint pain                severe headache                 COMMENTS OR ACTION PLAN FOR THIS PATIENT:        PT HAS BEEN FULLY VACCINATED/[facility]

## 2019-08-21 ENCOUNTER — Ambulatory Visit (HOSPITAL_COMMUNITY)
Admission: RE | Admit: 2019-08-21 | Discharge: 2019-08-21 | Disposition: A | Discharge status: Home / Self Care | Payer: Medicare Other | Source: Ambulatory Visit | Attending: Surgery | Admitting: Surgery

## 2019-08-21 ENCOUNTER — Other Ambulatory Visit: Payer: Self-pay

## 2019-08-21 ENCOUNTER — Ambulatory Visit (INDEPENDENT_AMBULATORY_CARE_PROVIDER_SITE_OTHER): Payer: Medicare Other | Admitting: Physician Assistant

## 2019-08-21 VITALS — BP 172/89 | HR 68 | Temp 97.9°F | Resp 20 | Ht 68.0 in | Wt 166.0 lb

## 2019-08-21 DIAGNOSIS — I6521 Occlusion and stenosis of right carotid artery: Secondary | ICD-10-CM

## 2019-08-21 NOTE — Progress Notes (Addendum)
HISTORY AND PHYSICAL     CC:  follow up. Requesting Provider:  Sharilyn Sites, MD  HPI: This is a 74 y.o. male here for follow up for carotid artery stenosis.    Pt was last seen 08/01/2018 by Dr. Trula Slade.  in 2016 he had an episode where his left lip went numb. This lasted about a minute. He had a second event were his left lip and left foot went numb, this also lasting approximately 1 minute. He has not had any additional symptoms. A carotid Doppler study showed 50-69% stenosis. He was sent for a CT scan for further evaluation. 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 There was only mild extracranial carotid disease. Medical management was recommended.  I has hx of prostate cancer radiation.  Pt returns today for follow up.  He denies any amaurosis fugax, speech difficulties, weakness, numbness, paralysis or clumsiness.  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  He had some gout and this has improved.  He does have some peeling where the gout was.  He denies any claudication sx.  He does have some cramping in his feet at night and says this happens when his potassium is low.  He states that he knows his blood pressure is high.  He is taking a log of his BP 4 x per day with his fluid pill for a week, half a pill and none.  He denies any family hx of AAA.  He states he has never had any heart issues.  He is followed by Dr. Hilma Favors in [place].   The pt is not on a statin for cholesterol management.  He states he read the side effects and did not want to take the medication.  The pt is on a daily aspirin.   Other AC:  none The pt is on ARB for hypertension.   The pt is not diabetic.   Tobacco hx:  Former-quit 1977   Past Medical History:  Diagnosis Date  . Aortic atherosclerosis ([place]) 10/12/2017   Noted on CT   . Arthritis    bilateral legs /   Neck-Degenerative Disc  Disease  . Carotid artery occlusion    Right Carotid   . Cervical spondylosis   . Cervical stenosis of spine   . Degenerative disc disease, cervical    C4-7  . Diabetes mellitus without complication ([place])    diet and exercise controlled, no medications, hgb A1C 07/31/2017 (6.9)  . External hemorrhoids   . Hepatic cyst 10/12/2017   Low density lession right hepatic lobe, noted on CT  . Hepatitis    Hepatitis C negative 12/05/2014  . History of colon polyps   . Hypertension   . Leg cramps   . Prostate CA ([place])   . Protrusion of intervertebral disc of lumbosacral region    L5-S1  . PTSD (post-traumatic stress disorder)   . PTSD (post-traumatic stress disorder)   . Pulmonary nodule 11/26/2016   40mm subpleural right lower lobe, noted on CT ABD/PELVIS  . Right shoulder tendinitis   . Sleep apnea    Stop Bang score of 5  . Stroke [facility]) 2016   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    Past Surgical History:  Procedure Laterality Date  . CATARACT EXTRACTION W/PHACO  07/20/2011   Procedure: CATARACT EXTRACTION PHACO AND INTRAOCULAR LENS PLACEMENT (IOC);  Surgeon:  Williams Che, MD;  Location: [facility];  Service: Ophthalmology;  Laterality: Right;  CDE=25.73  . CATARACT EXTRACTION W/PHACO  08/31/2011   Procedure: CATARACT EXTRACTION PHACO AND INTRAOCULAR LENS PLACEMENT (IOC);  Surgeon: Williams Che, MD;  Location: [facility];  Service: Ophthalmology;  Laterality: Left;  CDE: 38.59  . COLONOSCOPY N/A 09/10/2016   Procedure: COLONOSCOPY;  Surgeon: Rogene Houston, MD;  Location: [facility];  Service: Endoscopy;  Laterality: N/A;  1200  . COLONOSCOPY W/ POLYPECTOMY  2011   [facility]-Dr. Rehman  . cortisone injection Left 01/13/2016   left shoulder  . cortisone injection Left 09/21/2017   left shoulder  . CYSTOSCOPY  01/20/2018   Procedure: CYSTOSCOPY FLEXIBLE;  Surgeon: Cleon Gustin, MD;  Location: [facility];  Service: Urology;;  no seeds found in  bladder  . EYE SURGERY     laser surgery to repair retina tare  . head reconstruction  1966   due to head injury in war  . LIPOMA EXCISION Left 02/01/2019   Procedure: EXCISION LIPOMA LEFT UPPER EXTREMITY;  Surgeon: Aviva Signs, MD;  Location: [facility];  Service: General;  Laterality: Left;  . PROSTATE BIOPSY  05/20/2016  . RADIOACTIVE SEED IMPLANT N/A 01/20/2018   Procedure: RADIOACTIVE SEED IMPLANT/BRACHYTHERAPY IMPLANT;  Surgeon: Cleon Gustin, MD;  Location: [facility];  Service: Urology;  Laterality: N/A;     88    seeds implanted  . REFRACTIVE SURGERY Left 03/05/2017   to resolve cloudiness  . SPACE OAR INSTILLATION N/A 01/20/2018   Procedure: SPACE OAR INSTILLATION;  Surgeon: Cleon Gustin, MD;  Location: [facility];  Service: Urology;  Laterality: N/A;  . TRIGGER FINGER RELEASE      Allergies  Allergen Reactions  . Penicillins Rash and Other (See Comments)    Childhood allergy Has patient had a PCN reaction causing immediate rash, facial/tongue/throat swelling, SOB or lightheadedness with hypotension: yes Has patient had a PCN reaction causing severe rash involving mucus membranes or skin necrosis: no Has patient had a PCN reaction that required hospitalization no Has patient had a PCN reaction occurring within the last 10 years: no If all of the above answers are "NO", then may proceed with Cephalosporin use.      Current Outpatient Medications  Medication Sig Dispense Refill  . aspirin 81 MG tablet Take 1 tablet (81 mg total) by mouth daily. 30 tablet   . Cholecalciferol (VITAMIN D3) 5000 UNITS CAPS Take 5,000 Units by mouth daily.     . Lancets (ONETOUCH ULTRASOFT) lancets     . losartan-hydrochlorothiazide (HYZAAR) 50-12.5 MG tablet Take 1 tablet by mouth daily.    . naproxen (NAPROSYN) 500 MG tablet Take 250 mg by mouth daily.     . ONE TOUCH ULTRA TEST test strip     . potassium chloride SA (K-DUR,KLOR-CON) 20 MEQ tablet Take  20 mEq by mouth daily as needed (cramping).     . tamsulosin (FLOMAX) 0.4 MG CAPS capsule Take 1 capsule (0.4 mg total) by mouth daily after supper. 90 capsule 3  . Turmeric (QC TUMERIC COMPLEX) 500 MG CAPS Take by mouth.    . vitamin E 1000 UNIT capsule Take 1,000 Units by mouth daily.     No current facility-administered medications for this visit.    Family History  Problem Relation Age of Onset  . Diabetes Mother   . Hypertension Father   . Heart disease Father  before age 42  . Heart attack Father   . Pancreatic cancer Brother   . Anesthesia problems Neg Hx   . Hypotension Neg Hx   . Malignant hyperthermia Neg Hx   . Pseudochol deficiency Neg Hx     Social History   Socioeconomic History  . Marital status: Married    Spouse name: Not on file  . Number of children: Not on file  . Years of education: Not on file  . Highest education level: Not on file  Occupational History  . Occupation: retired  Tobacco Use  . Smoking status: Former Smoker    Packs/day: 1.00    Years: 13.00    Pack years: 13.00    Types: Cigarettes    Quit date: 04/17/1976    Years since quitting: 43.3  . Smokeless tobacco: Never Used  Substance and Sexual Activity  . Alcohol use: Yes    Alcohol/week: 4.0 - 6.0 standard drinks    Types: 4 - 6 Shots of liquor per week    Comment: 2-3 mixed drinks on a saturday night  . Drug use: Never  . Sexual activity: Not Currently  Other Topics Concern  . Not on file  Social History Narrative  . Not on file   Social Determinants of Health   Financial Resource Strain:   . Difficulty of Paying Living Expenses:   Food Insecurity:   . Worried About Charity fundraiser in the Last Year:   . Arboriculturist in the Last Year:   Transportation Needs:   . Film/video editor (Medical):   Marland Kitchen Lack of Transportation (Non-Medical):   Physical Activity:   . Days of Exercise per Week:   . Minutes of Exercise per Session:   Stress:   . Feeling of  Stress :   Social Connections:   . Frequency of Communication with Friends and Family:   . Frequency of Social Gatherings with Friends and Family:   . Attends Religious Services:   . Active Member of Clubs or Organizations:   . Attends Archivist Meetings:   Marland Kitchen Marital Status:   Intimate Partner Violence:   . Fear of Current or Ex-Partner:   . Emotionally Abused:   Marland Kitchen Physically Abused:   . Sexually Abused:      REVIEW OF SYSTEMS:   [X]  denotes positive finding, [ ]  denotes negative finding Cardiac  Comments:  Chest pain or chest pressure:    Shortness of breath upon exertion:    Short of breath when lying flat:    Irregular heart rhythm:        Vascular    Pain in calf, thigh, or hip brought on by ambulation: x Cramping at night  Pain in feet at night that wakes you up from your sleep:  x   Blood clot in your veins:    Leg swelling:         Pulmonary    Oxygen at home:    Productive cough:     Wheezing:         Neurologic    Sudden weakness in arms or legs:     Sudden numbness in arms or legs:     Sudden onset of difficulty speaking or slurred speech:    Temporary loss of vision in one eye:     Problems with dizziness:         Gastrointestinal    Blood in stool:     Vomited blood:  Genitourinary    Burning when urinating:     Blood in urine:        Psychiatric    Major depression:         Hematologic    Bleeding problems:    Problems with blood clotting too easily:        Skin    Rashes or ulcers:        Constitutional    Fever or chills:      PHYSICAL EXAMINATION:  Today's Vitals   08/21/19 1454 08/21/19 1456  BP: (!) 174/91 (!) 172/89  Pulse: 68   Resp: 20   Temp: 97.9 F (36.6 C)   SpO2: 98%   Weight: 166 lb (75.3 kg)   Height: 5\' 8"  (1.727 m)    Body mass index is 25.24 kg/m.   General:  WDWN in NAD; vital signs documented above Gait: Normal HENT: WNL, normocephalic Pulmonary: normal non-labored breathing ,  without Rales, rhonchi,  wheezing Cardiac: regular HR, without  Murmurs, rubs or gallops; without carotid bruits Abdomen: soft, NT, no masses Skin: without rashes Vascular Exam/Pulses:  Right Left  Radial 2+ (normal) 2+ (normal)  Ulnar 1+ (weak) 1+ (weak)  Popliteal Unable to palpate  Unable to palpate   DP 2+ (normal) 2+ (normal)  PT 2+ (normal) 2+ (normal)   Extremities: without ischemic changes, without Gangrene , without cellulitis; without open wounds;  Musculoskeletal: no muscle wasting or atrophy  Neurologic: A&O X 3 Psychiatric:  The pt has Normal affect.   Non-Invasive Vascular Imaging:   Carotid Duplex on 08/21/2019: Right:  40-59% ICA stenosis Left:  1-39% ICA stenosis Vertebrals: Bilateral vertebral arteries demonstrate antegrade flow.  Subclavians: Normal flow hemodynamics were seen in bilateral subclavianarteries.  Previous Carotid duplex on 08/01/2018: Right: 1-39% ICA stenosis Left:   1-39% ICA stenosis   ASSESSMENT/PLAN:: 74 y.o. male here for follow up carotid artery stenosis with hx of left brain stroke who is following up today for his annual carotid duplex.  -pt remains asymptomatic.  His right carotid artery stenosis has jumped up to the 40-59% category.  Will continue to follow this on a yearly basis unless he becomes symptomatic.  I discussed with the pt that we would not consider surgery unless his stenosis is > 80% or he becomes symptomatic.   -he is not on a statin due to the list of side effects he read about.  Discussed the benefit of the statin.  He does have the prescription at home.  He is going to start this.  He has appt with Dr. Hilma Favors in the next few weeks and his lab work can be checked at that visit and they can discuss this further.   He knows that if he gets any myalgias, he will stop taking statin.  -discussed healthy diet and exercise -continue asa daily -discussed s/s of stroke with pt and they understand should they develop any of  these sx, they will go to the nearest ER.  He will call sooner should he have any issues before his next appt.  -a copy of his carotid duplex results was printed and given to the pt.   Leontine Locket, [facility] [phone]  Clinic MD:  Trula Slade

## 2019-08-23 ENCOUNTER — Other Ambulatory Visit: Payer: Self-pay | Admitting: *Deleted

## 2019-08-23 DIAGNOSIS — I6521 Occlusion and stenosis of right carotid artery: Secondary | ICD-10-CM

## 2019-08-28 DIAGNOSIS — I10 Essential (primary) hypertension: Secondary | ICD-10-CM | POA: Diagnosis not present

## 2019-08-28 DIAGNOSIS — Z1389 Encounter for screening for other disorder: Secondary | ICD-10-CM | POA: Diagnosis not present

## 2019-08-28 DIAGNOSIS — J449 Chronic obstructive pulmonary disease, unspecified: Secondary | ICD-10-CM | POA: Diagnosis not present

## 2019-08-28 DIAGNOSIS — Z86010 Personal history of colonic polyps: Secondary | ICD-10-CM | POA: Diagnosis not present

## 2019-08-28 DIAGNOSIS — E119 Type 2 diabetes mellitus without complications: Secondary | ICD-10-CM | POA: Diagnosis not present

## 2019-08-28 DIAGNOSIS — Z0001 Encounter for general adult medical examination with abnormal findings: Secondary | ICD-10-CM | POA: Diagnosis not present

## 2019-08-28 DIAGNOSIS — M109 Gout, unspecified: Secondary | ICD-10-CM | POA: Diagnosis not present

## 2019-08-28 DIAGNOSIS — E7849 Other hyperlipidemia: Secondary | ICD-10-CM | POA: Diagnosis not present

## 2019-09-01 IMAGING — CR DG CHEST 2V
2 series · 2 of 2 positions shown · non-contrast
Comparison: None.

CLINICAL DATA: Preoperative examination. Patient for brachytherapy
seed implantation.

EXAM:
CHEST - 2 VIEW

[w chest pa]
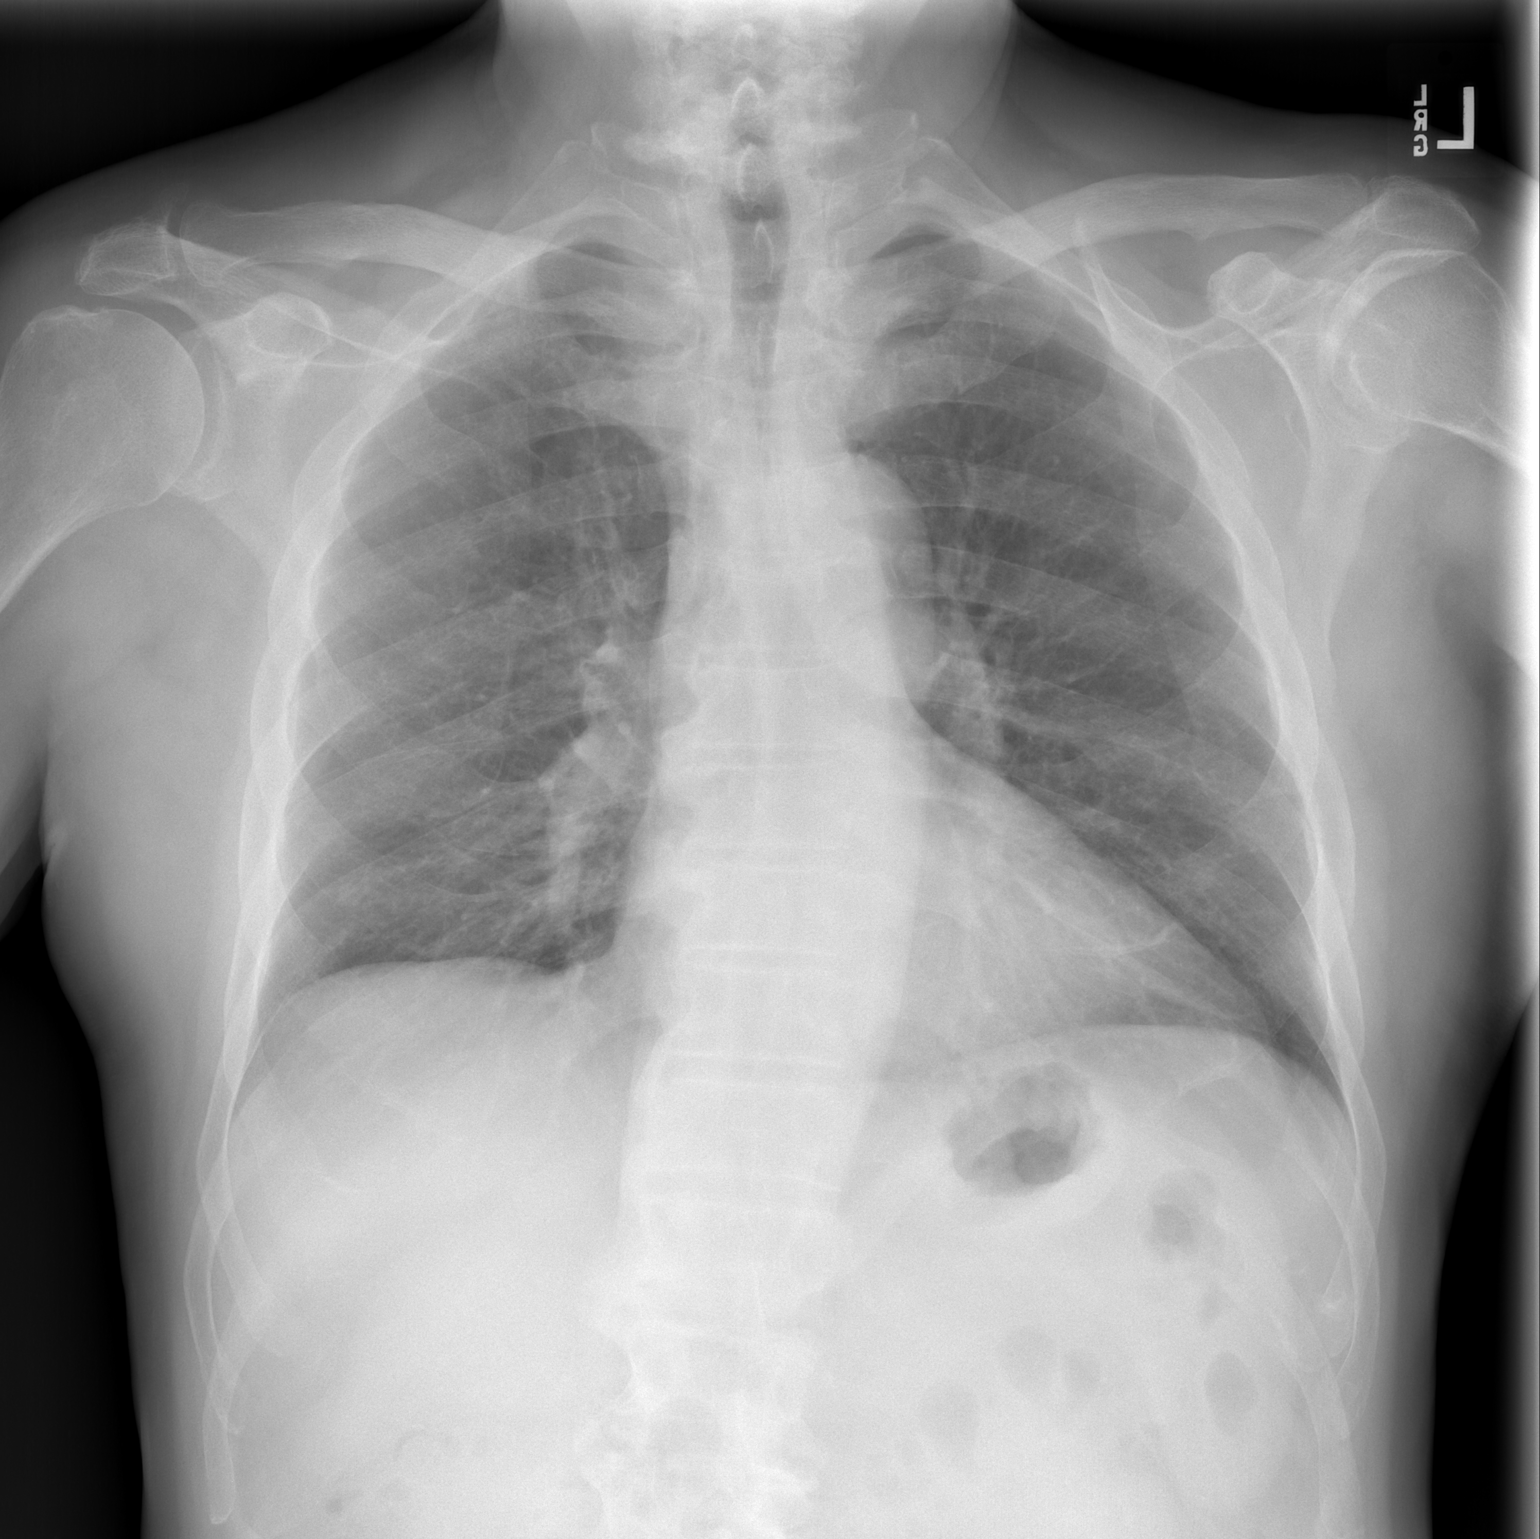

[w chest lat]
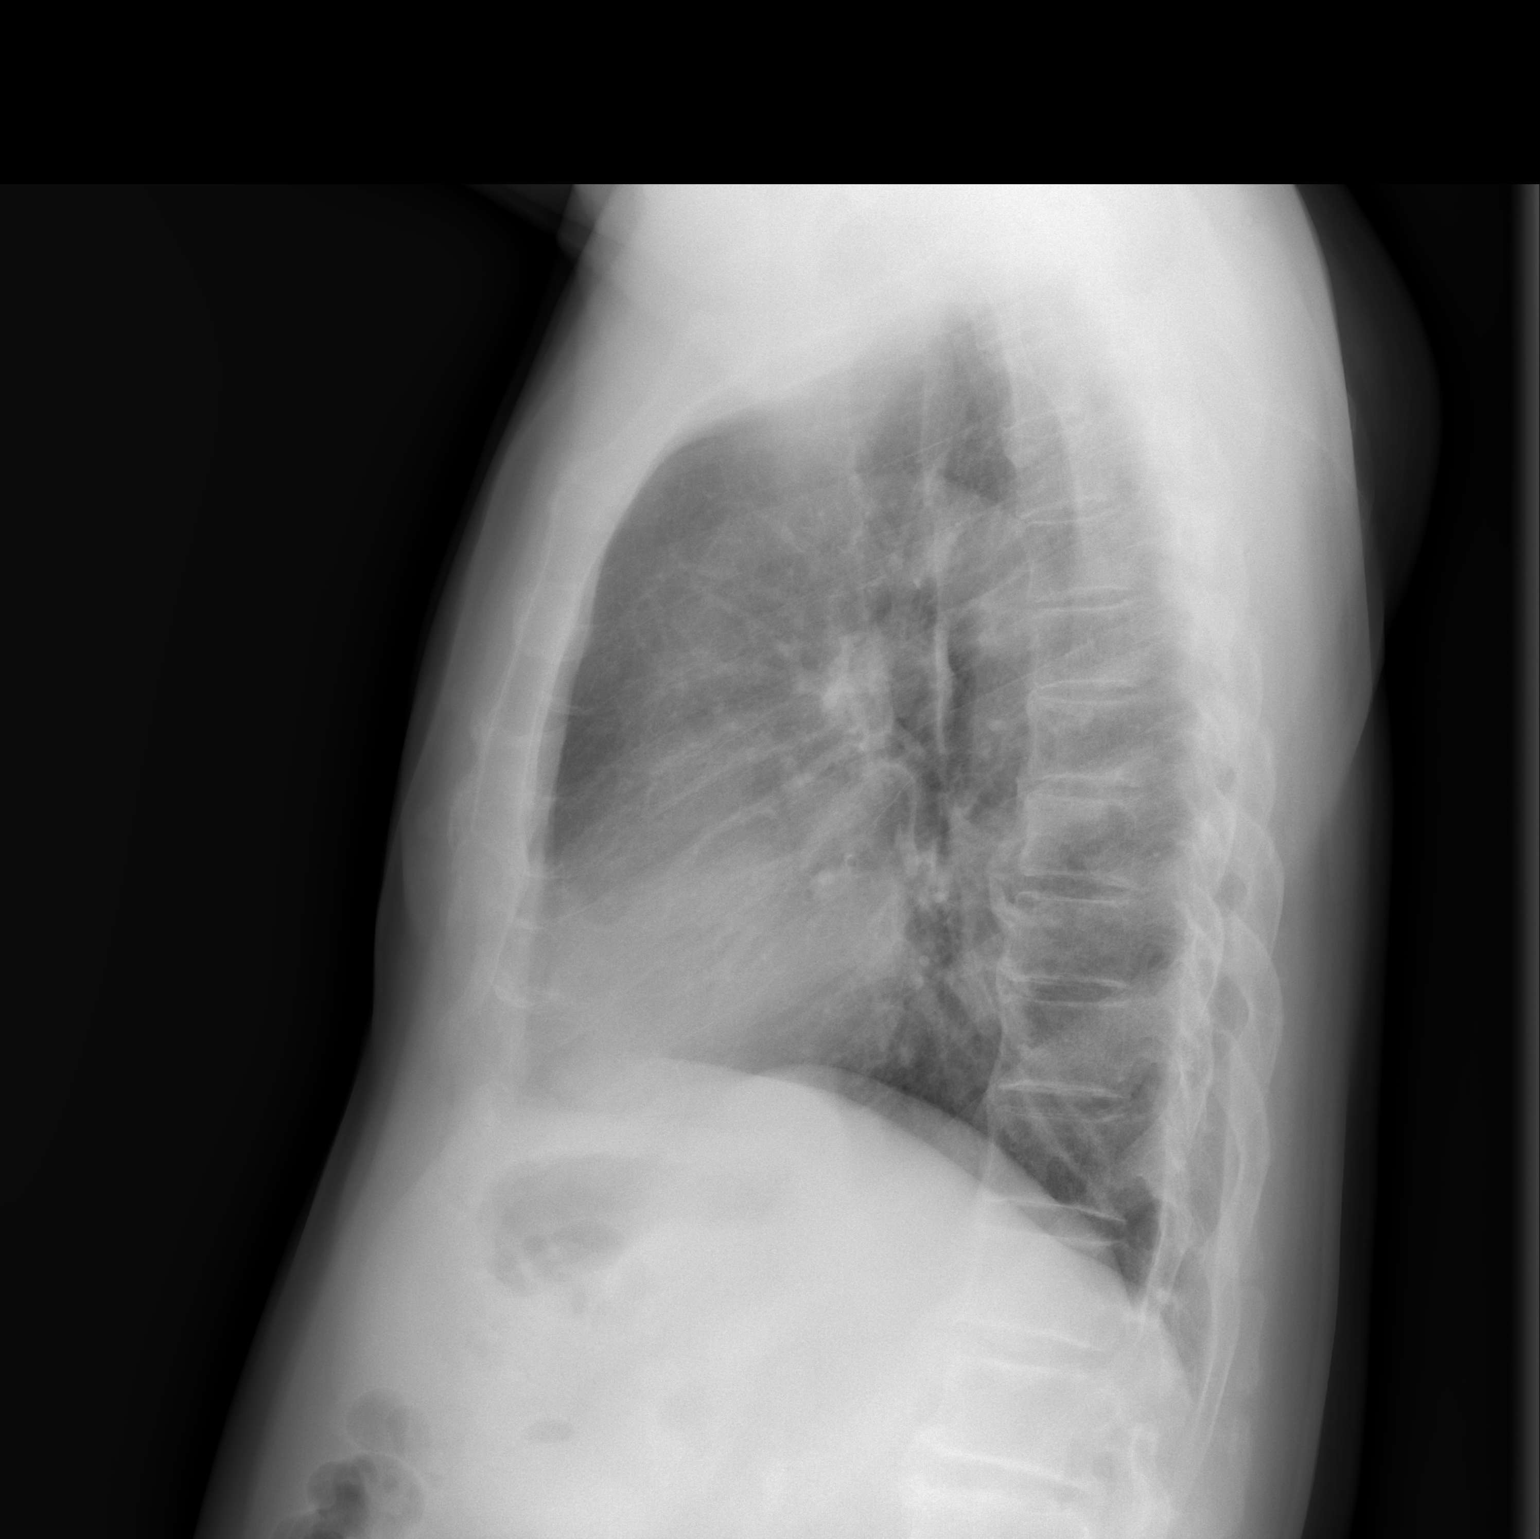

[2 of 2 positions shown; findings below may reference images not displayed]

FINDINGS: The lungs are clear. Heart size is normal. There is no pneumothorax
or pleural effusion. No acute or focal bony abnormality is
identified.
IMPRESSION: No acute disease.

## 2019-09-15 DIAGNOSIS — M109 Gout, unspecified: Secondary | ICD-10-CM | POA: Diagnosis not present

## 2019-11-15 DIAGNOSIS — E119 Type 2 diabetes mellitus without complications: Secondary | ICD-10-CM | POA: Diagnosis not present

## 2019-11-15 DIAGNOSIS — I10 Essential (primary) hypertension: Secondary | ICD-10-CM | POA: Diagnosis not present

## 2020-01-08 DIAGNOSIS — H35373 Puckering of macula, bilateral: Secondary | ICD-10-CM | POA: Diagnosis not present

## 2020-01-24 ENCOUNTER — Encounter: Payer: Self-pay | Admitting: Urology

## 2020-01-24 ENCOUNTER — Ambulatory Visit (INDEPENDENT_AMBULATORY_CARE_PROVIDER_SITE_OTHER): Payer: Medicare Other | Admitting: Urology

## 2020-01-24 ENCOUNTER — Other Ambulatory Visit: Payer: Self-pay

## 2020-01-24 ENCOUNTER — Ambulatory Visit: Payer: Medicare Other | Admitting: Urology

## 2020-01-24 VITALS — BP 161/70 | HR 74 | Temp 97.9°F | Ht 68.0 in | Wt 165.0 lb

## 2020-01-24 DIAGNOSIS — C61 Malignant neoplasm of prostate: Secondary | ICD-10-CM | POA: Diagnosis not present

## 2020-01-24 DIAGNOSIS — R351 Nocturia: Secondary | ICD-10-CM

## 2020-01-24 DIAGNOSIS — N401 Enlarged prostate with lower urinary tract symptoms: Secondary | ICD-10-CM

## 2020-01-24 DIAGNOSIS — N138 Other obstructive and reflux uropathy: Secondary | ICD-10-CM | POA: Diagnosis not present

## 2020-01-24 LAB — MICROSCOPIC EXAMINATION
Bacteria, UA: NONE SEEN
Epithelial Cells (non renal): NONE SEEN /hpf (ref 0–10)
Renal Epithel, UA: NONE SEEN /hpf
WBC, UA: NONE SEEN /hpf (ref 0–5)

## 2020-01-24 LAB — URINALYSIS, ROUTINE W REFLEX MICROSCOPIC
Bilirubin, UA: NEGATIVE
Glucose, UA: NEGATIVE
Ketones, UA: NEGATIVE
Leukocytes,UA: NEGATIVE
Nitrite, UA: NEGATIVE
Protein,UA: NEGATIVE
Specific Gravity, UA: 1.025 (ref 1.005–1.030)
Urobilinogen, Ur: 0.2 mg/dL (ref 0.2–1.0)
pH, UA: 7 (ref 5.0–7.5)

## 2020-01-24 MED ORDER — TAMSULOSIN HCL 0.4 MG PO CAPS: 0.4000 mg | ORAL_CAPSULE | Freq: Every day | ORAL | 3 refills | Status: DC

## 2020-01-24 NOTE — Patient Instructions (Signed)
Prostate Cancer  The prostate is a male gland that helps make semen. Prostate cancer is when abnormal cells grow in this gland. Follow these instructions at home:  Take over-the-counter and prescription medicines only as told by your doctor.  Eat a healthy diet.  Get plenty of sleep.  Ask your doctor for help to find a support group for men with prostate cancer.  Keep all follow-up visits as told by your doctor. This is important.  If you have to go to the hospital, let your cancer doctor (oncologist) know.  Touch, hold, hug, and caress your partner to continue to show sexual feelings. Contact a doctor if:  You have trouble peeing (urinating).  You have blood in your pee (urine).  You have pain in your hips, back, or chest. Get help right away if:  You have weakness in your legs.  You lose feeling (have numbness) in your legs.  You cannot control your pee or your poop (stool).  You have trouble breathing.  You have sudden pain in your chest.  You have chills or a fever. Summary  The prostate is a male gland that helps make semen. Prostate cancer is when abnormal cells grow in this gland.  Ask your doctor for help to find a support group for men with prostate cancer.  Contact a doctor if you have problems peeing or have any new pain that you did not have before. This information is not intended to replace advice given to you by your health care provider. Make sure you discuss any questions you have with your health care provider. Document Revised: 04/16/2017 Document Reviewed: 01/13/2016 Elsevier Patient Education  2020 [employer]  

## 2020-01-24 NOTE — Progress Notes (Signed)
01/24/2020 10:10 AM   Claudia Pollock May 05, 1946 [id number]  Referring provider: Sharilyn Sites, [address] [place],  [place] [address]  followup nocturia  HPI: Mr Letourneau is a 74yo here for followup for prostate cancer, and BPH with Nocturia. He has stable urinary frequency, urgency and nocturia on flomax 0.4mg  daily. Dysuria is very rare. UA today is normal. PSA drawn Friday is pending   PMH: Past Medical History:  Diagnosis Date  . Aortic atherosclerosis ([place]) 10/12/2017   Noted on CT   . Arthritis    bilateral legs /   Neck-Degenerative Disc Disease  . Carotid artery occlusion    Right Carotid   . Cervical spondylosis   . Cervical stenosis of spine   . Degenerative disc disease, cervical    C4-7  . Diabetes mellitus without complication ([place])    diet and exercise controlled, no medications, hgb A1C 07/31/2017 (6.9)  . External hemorrhoids   . Hepatic cyst 10/12/2017   Low density lession right hepatic lobe, noted on CT  . Hepatitis    Hepatitis C negative 12/05/2014  . History of colon polyps   . Hypertension   . Leg cramps   . Prostate CA ([place])   . Protrusion of intervertebral disc of lumbosacral region    L5-S1  . PTSD (post-traumatic stress disorder)   . PTSD (post-traumatic stress disorder)   . Pulmonary nodule 11/26/2016   24mm subpleural right lower lobe, noted on CT ABD/PELVIS  . Right shoulder tendinitis   . Sleep apnea    Stop Bang score of 5  . Stroke [facility]) 2016   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    Surgical History: Past Surgical History:  Procedure Laterality Date  . CATARACT EXTRACTION W/PHACO  07/20/2011   Procedure: CATARACT EXTRACTION PHACO AND INTRAOCULAR LENS PLACEMENT (IOC);  Surgeon: Williams Che, MD;  Location: [facility];  Service: Ophthalmology;  Laterality: Right;  CDE=25.73  . CATARACT EXTRACTION W/PHACO  08/31/2011   Procedure: CATARACT EXTRACTION PHACO AND INTRAOCULAR LENS PLACEMENT (IOC);  Surgeon: Williams Che, MD;  Location: [facility];  Service: Ophthalmology;  Laterality: Left;  CDE: 38.59  . COLONOSCOPY N/A 09/10/2016   Procedure: COLONOSCOPY;  Surgeon: Rogene Houston, MD;  [facility];  Service: Endoscopy;  Laterality: N/A;  1200  . COLONOSCOPY W/ POLYPECTOMY  2011   [facility]-Dr. Rehman  . cortisone injection Left 01/13/2016   left shoulder  . cortisone injection Left 09/21/2017   left shoulder  . CYSTOSCOPY  01/20/2018   Procedure: CYSTOSCOPY FLEXIBLE;  Surgeon: Cleon Gustin, MD;  Location: [facility];  Service: Urology;;  no seeds found in bladder  . EYE SURGERY     laser surgery to repair retina tare  . head reconstruction  1966   due to head injury in war  . LIPOMA EXCISION Left 02/01/2019   Procedure: EXCISION LIPOMA LEFT UPPER EXTREMITY;  Surgeon: Aviva Signs, MD;  Location: [facility];  Service: General;  Laterality: Left;  . PROSTATE BIOPSY  05/20/2016  . RADIOACTIVE SEED IMPLANT N/A 01/20/2018   Procedure: RADIOACTIVE SEED IMPLANT/BRACHYTHERAPY IMPLANT;  Surgeon: Cleon Gustin, MD;  Location: [facility];  Service: Urology;  Laterality: N/A;     88    seeds implanted  . REFRACTIVE SURGERY Left 03/05/2017   to resolve cloudiness  . SPACE OAR INSTILLATION N/A 01/20/2018   Procedure: SPACE OAR INSTILLATION;  Surgeon: Cleon Gustin, MD;  Location: [place];  Service: Urology;  Laterality: N/A;  . TRIGGER FINGER RELEASE      Home Medications:  Allergies as of 01/24/2020      Reactions   Penicillins Rash, Other (See Comments)   Childhood allergy Has patient had a PCN reaction causing immediate rash, facial/tongue/throat swelling, SOB or lightheadedness with hypotension: yes Has patient had a PCN reaction causing severe rash involving mucus membranes or skin necrosis: no Has patient had a PCN reaction that required hospitalization no Has patient had a PCN reaction occurring within the last 10 years:  no If all of the above answers are "NO", then may proceed with Cephalosporin use.      Medication List       Accurate as of January 24, 2020 10:10 AM. If you have any questions, ask your nurse or doctor.        aspirin 81 MG tablet Take 1 tablet (81 mg total) by mouth daily.   gabapentin 100 MG capsule Commonly known as: NEURONTIN SMARTSIG:1 Capsule(s) By Mouth 1 to 3 Times Daily   losartan-hydrochlorothiazide 50-12.5 MG tablet Commonly known as: HYZAAR Take 1 tablet by mouth daily.   naproxen 500 MG tablet Commonly known as: NAPROSYN Take 250 mg by mouth daily.   ONE TOUCH ULTRA TEST test strip Generic drug: glucose blood   onetouch ultrasoft lancets   potassium chloride SA 20 MEQ tablet Commonly known as: KLOR-CON Take 20 mEq by mouth daily as needed (cramping).   QC Tumeric Complex 500 MG Caps Generic drug: Turmeric Take by mouth.   tamsulosin 0.4 MG Caps capsule Commonly known as: FLOMAX Take 1 capsule (0.4 mg total) by mouth daily after supper.   Vitamin D3 125 MCG (5000 UT) Caps Take 5,000 Units by mouth daily.   vitamin E 1000 UNIT capsule Take 1,000 Units by mouth daily.       Allergies:  Allergies  Allergen Reactions  . Penicillins Rash and Other (See Comments)    Childhood allergy Has patient had a PCN reaction causing immediate rash, facial/tongue/throat swelling, SOB or lightheadedness with hypotension: yes Has patient had a PCN reaction causing severe rash involving mucus membranes or skin necrosis: no Has patient had a PCN reaction that required hospitalization no Has patient had a PCN reaction occurring within the last 10 years: no If all of the above answers are "NO", then may proceed with Cephalosporin use.      Family History: Family History  Problem Relation Age of Onset  . Diabetes Mother   . Hypertension Father   . Heart disease Father        before age 55  . Heart attack Father   . Pancreatic cancer Brother   .  Anesthesia problems Neg Hx   . Hypotension Neg Hx   . Malignant hyperthermia Neg Hx   . Pseudochol deficiency Neg Hx     Social History:  reports that he quit smoking about 43 years ago. His smoking use included cigarettes. He has a 13.00 pack-year smoking history. He has never used smokeless tobacco. He reports current alcohol use of about 4.0 - 6.0 standard drinks of alcohol per week. He reports that he does not use drugs.  ROS: All other review of systems were reviewed and are negative except what is noted above in HPI  Physical Exam: BP (!) 161/70   Pulse 74   Temp 97.9 F (36.6 C)   Ht 5\' 8"  (1.727 m)   Wt 165 lb (  74.8 kg)   BMI 25.09 kg/m   Constitutional:  Alert and oriented, No acute distress. HEENT: [place] AT, moist mucus membranes.  Trachea midline, no masses. Cardiovascular: No clubbing, cyanosis, or edema. Respiratory: Normal respiratory effort, no increased work of breathing. GI: Abdomen is soft, nontender, nondistended, no abdominal masses GU: No CVA tenderness.  Lymph: No cervical or inguinal lymphadenopathy. Skin: No rashes, bruises or suspicious lesions. Neurologic: Grossly intact, no focal deficits, moving all 4 extremities. Psychiatric: Normal mood and affect.  Laboratory Data: Lab Results  Component Value Date   WBC 6.0 01/13/2018   HGB 16.5 01/13/2018   HCT 48.5 01/13/2018   MCV 91.7 01/13/2018   PLT 171 01/13/2018    Lab Results  Component Value Date   CREATININE 1.04 01/30/2019    No results found for: PSA  No results found for: TESTOSTERONE  Lab Results  Component Value Date   HGBA1C 6.2 (H) 01/30/2019    Urinalysis No results found for: COLORURINE, APPEARANCEUR, LABSPEC, PHURINE, GLUCOSEU, HGBUR, BILIRUBINUR, KETONESUR, PROTEINUR, UROBILINOGEN, NITRITE, LEUKOCYTESUR  No results found for: LABMICR, [place], RBCUA, LABEPIT, MUCUS, BACTERIA  Pertinent Imaging:  No results found for this or any previous visit.  No results found for this  or any previous visit.  No results found for this or any previous visit.  No results found for this or any previous visit.  No results found for this or any previous visit.  No results found for this or any previous visit.  No results found for this or any previous visit.  No results found for this or any previous visit.   Assessment & Plan:    1. Benign prostatic hyperplasia with urinary obstruction -COntinue flomax 0.4mg   2. Nocturia -Continue flomax 0.4mg  daily  3. Prostate cancer ([place]) -RTC 6 months with PSA -We will call with PSA results from Friday once they are available   No follow-ups on file.  Nicolette Bang, MD  [facility] [place]

## 2020-01-24 NOTE — Addendum Note (Signed)
Addended by: Valentina Lucks on: 01/24/2020 11:33 AM   Modules accepted: Orders

## 2020-01-24 NOTE — Progress Notes (Signed)

## 2020-03-01 DIAGNOSIS — Z23 Encounter for immunization: Secondary | ICD-10-CM | POA: Diagnosis not present

## 2020-04-22 DIAGNOSIS — E1140 Type 2 diabetes mellitus with diabetic neuropathy, unspecified: Secondary | ICD-10-CM | POA: Diagnosis not present

## 2020-04-22 DIAGNOSIS — J329 Chronic sinusitis, unspecified: Secondary | ICD-10-CM | POA: Diagnosis not present

## 2020-04-22 DIAGNOSIS — Z681 Body mass index (BMI) 19 or less, adult: Secondary | ICD-10-CM | POA: Diagnosis not present

## 2020-07-11 DIAGNOSIS — H524 Presbyopia: Secondary | ICD-10-CM | POA: Diagnosis not present

## 2020-07-11 DIAGNOSIS — H35371 Puckering of macula, right eye: Secondary | ICD-10-CM | POA: Diagnosis not present

## 2020-07-16 ENCOUNTER — Other Ambulatory Visit: Payer: Self-pay

## 2020-07-16 ENCOUNTER — Other Ambulatory Visit: Payer: Medicare Other

## 2020-07-16 DIAGNOSIS — C61 Malignant neoplasm of prostate: Secondary | ICD-10-CM

## 2020-07-17 ENCOUNTER — Other Ambulatory Visit: Payer: Medicare Other

## 2020-07-17 LAB — PSA: Prostate Specific Ag, Serum: 0.2 ng/mL (ref 0.0–4.0)

## 2020-07-23 ENCOUNTER — Telehealth: Payer: Self-pay

## 2020-07-23 NOTE — Telephone Encounter (Signed)
-----   Message from Cleon Gustin, MD sent at 07/23/2020 10:07 AM EST ----- normal ----- Message ----- From: Valentina Lucks, LPN Sent: 01/19/9446   8:31 AM EST To: Cleon Gustin, MD  Pls  review.

## 2020-07-23 NOTE — Telephone Encounter (Signed)
Called pt and notified PSA normal.

## 2020-07-23 NOTE — Progress Notes (Signed)
Pt notified of PSA results.

## 2020-07-24 ENCOUNTER — Other Ambulatory Visit: Payer: Self-pay

## 2020-07-24 ENCOUNTER — Telehealth: Payer: Self-pay | Admitting: Urology

## 2020-07-24 ENCOUNTER — Encounter: Payer: Self-pay | Admitting: Urology

## 2020-07-24 ENCOUNTER — Ambulatory Visit (INDEPENDENT_AMBULATORY_CARE_PROVIDER_SITE_OTHER): Payer: Medicare Other | Admitting: Urology

## 2020-07-24 VITALS — BP 144/73 | HR 98 | Temp 98.4°F | Ht 68.0 in | Wt 165.0 lb

## 2020-07-24 DIAGNOSIS — N401 Enlarged prostate with lower urinary tract symptoms: Secondary | ICD-10-CM | POA: Diagnosis not present

## 2020-07-24 DIAGNOSIS — N5201 Erectile dysfunction due to arterial insufficiency: Secondary | ICD-10-CM | POA: Diagnosis not present

## 2020-07-24 DIAGNOSIS — N138 Other obstructive and reflux uropathy: Secondary | ICD-10-CM | POA: Diagnosis not present

## 2020-07-24 DIAGNOSIS — R351 Nocturia: Secondary | ICD-10-CM | POA: Diagnosis not present

## 2020-07-24 DIAGNOSIS — C61 Malignant neoplasm of prostate: Secondary | ICD-10-CM

## 2020-07-24 LAB — URINALYSIS, ROUTINE W REFLEX MICROSCOPIC
Bilirubin, UA: NEGATIVE
Glucose, UA: NEGATIVE
Ketones, UA: NEGATIVE
Leukocytes,UA: NEGATIVE
Nitrite, UA: NEGATIVE
Protein,UA: NEGATIVE
Specific Gravity, UA: >1.03 — ABNORMAL HIGH (ref 1.005–1.030)
Urobilinogen, Ur: 0.2 mg/dL (ref 0.2–1.0)
pH, UA: 6 (ref 5.0–7.5)

## 2020-07-24 LAB — MICROSCOPIC EXAMINATION
Bacteria, UA: NONE SEEN
Epithelial Cells (non renal): NONE SEEN /hpf (ref 0–10)
RBC, Urine: NONE SEEN /hpf (ref 0–2)
Renal Epithel, UA: NONE SEEN /hpf
WBC, UA: NONE SEEN /hpf (ref 0–5)

## 2020-07-24 LAB — BLADDER SCAN AMB NON-IMAGING: Scan Result: 41

## 2020-07-24 MED ORDER — TADALAFIL 5 MG PO TABS: 5.0000 mg | ORAL_TABLET | Freq: Every day | ORAL | 11 refills | Status: DC | PRN

## 2020-07-24 MED ORDER — TAMSULOSIN HCL 0.4 MG PO CAPS
0.4000 mg | ORAL_CAPSULE | Freq: Every day | ORAL | 3 refills | Status: DC
Start: 2020-07-24 — End: 2021-02-19

## 2020-07-24 MED ORDER — TADALAFIL 20 MG PO TABS: 20.0000 mg | ORAL_TABLET | Freq: Every day | ORAL | 5 refills | Status: DC | PRN

## 2020-07-24 NOTE — Progress Notes (Signed)
Bladder Scan Patient cannot void: 41 ml Performed By: Durenda Guthrie, lpn   Urological Symptom Review  Patient is experiencing the following symptoms: Frequent urination Hard to postpone urination Get up at night to urinate Leakage of urine Stream starts and stops Weak stream   Review of Systems  Gastrointestinal (upper)  : Negative for upper GI symptoms  Gastrointestinal (lower) : Negative for lower GI symptoms  Constitutional : Negative for symptoms  Skin: Negative for skin symptoms  Eyes: Negative for eye symptoms  Ear/Nose/Throat : Negative for Ear/Nose/Throat symptoms  Hematologic/Lymphatic: Easy bruising  Cardiovascular : Negative for cardiovascular symptoms  Respiratory : Negative for respiratory symptoms  Endocrine: Negative for endocrine symptoms  Musculoskeletal: Back pain Joint pain  Neurological: Negative for neurological symptoms  Psychologic: Negative for psychiatric symptoms

## 2020-07-24 NOTE — Telephone Encounter (Signed)
Called pharmacy and verified per Dr. Ruel Favors last office note that pt is on both 5mg  and 20mg  of Tadalafil.

## 2020-07-24 NOTE — Patient Instructions (Signed)
Prostate Cancer  The prostate is a male gland that helps make semen. It is located below a man's bladder, in front of the rectum. Prostate cancer is when abnormal cells grow in this gland. What are the causes? The cause of this condition is not known. What increases the risk? You are more likely to develop this condition if:  You are 77 years of age or older.  You are African American.  You have a family history of prostate cancer.  You have a family history of breast cancer. What are the signs or symptoms? Symptoms of this condition include:  A need to pee often.  Peeing that is weak, or pee that stops and starts.  Trouble starting or stopping your pee.  Inability to pee.  Blood in your pee or semen.  Pain in the lower back, lower belly (abdomen), hips, or upper thighs.  Trouble getting an erection.  Trouble emptying all of your pee. How is this treated? Treatment for this condition depends on your age, your health, the kind of treatment you like, and how far the cancer has spread. Treatments include:  Being watched. This is called observation. You will be tested from time to time, but you will not get treated. Tests are to make sure that the cancer is not growing.  Surgery. This may be done to remove the prostate, to remove the testicles, or to freeze or kill cancer cells.  Radiation. This uses a strong beam to kill cancer cells.  Ultrasound energy. This uses strong sound waves to kill cancer cells.  Chemotherapy. This uses medicines that stop cancer cells from increasing. This kills cancer cells and healthy cells.  Targeted therapy. This kills cancer cells only. Healthy cells are not affected.  Hormone treatment. This stops the body from making hormones that help the cancer cells to grow. Follow these instructions at home:  Take over-the-counter and prescription medicines only as told by your doctor.  Eat a healthy diet.  Get plenty of sleep.  Ask your  doctor for help to find a support group for men with prostate cancer.  If you have to go to the hospital, let your cancer doctor (oncologist) know.  Treatment may affect your ability to have sex. Touch, hold, hug, and caress your partner to have intimate moments.  Keep all follow-up visits as told by your doctor. This is important. Contact a doctor if:  You have new or more trouble peeing.  You have new or more blood in your pee.  You have new or more pain in your hips, back, or chest. Get help right away if:  You have weakness in your legs.  You lose feeling in your legs.  You cannot control your pee or your poop (stool).  You have chills or a fever. Summary  The prostate is a male gland that helps make semen.  Prostate cancer is when abnormal cells grow in this gland.  Treatment includes doing surgery, using medicines, using very strong beams, or watching without treatment.  Ask your doctor for help to find a support group for men with prostate cancer.  Contact a doctor if you have problems peeing or have any new pain that you did not have before. This information is not intended to replace advice given to you by your health care provider. Make sure you discuss any questions you have with your health care provider. Document Revised: 04/18/2019 Document Reviewed: 04/18/2019 Elsevier Patient Education  2021 [employer] Inc[employer]  

## 2020-07-24 NOTE — Progress Notes (Signed)
07/24/2020 9:43 AM   Robert Zhang Oct 18, 1945 [id number]  Referring provider: Sharilyn Sites, MD [address] [place],  [place] [address]  Followup BPH and prostate cancer  HPI: Robert Zhang is a 75yo here for followup for prostate cancer and BPH. PSA is stable at 0.2.  IPSS 28 QOL 3. He has nocturia 4x. He has urinary frequency every 1-2 hours. He has an intermittent weak stream. He is bothered by his LUTS but does not want surgery at this time.  He has issues getting an erection since his brachytherapy. He he tried cialis 20mg  prn which improved his ED but caused him to be dizzy.  Good exercise tolerance   PMH: Past Medical History:  Diagnosis Date  . Aortic atherosclerosis ([place]) 10/12/2017   Noted on CT   . Arthritis    bilateral legs /   Neck-Degenerative Disc Disease  . Carotid artery occlusion    Right Carotid   . Cervical spondylosis   . Cervical stenosis of spine   . Degenerative disc disease, cervical    C4-7  . Diabetes mellitus without complication ([place])    diet and exercise controlled, no medications, hgb A1C 07/31/2017 (6.9)  . External hemorrhoids   . Hepatic cyst 10/12/2017   Low density lession right hepatic lobe, noted on CT  . Hepatitis    Hepatitis C negative 12/05/2014  . History of colon polyps   . Hypertension   . Leg cramps   . Prostate CA ([place])   . Protrusion of intervertebral disc of lumbosacral region    L5-S1  . PTSD (post-traumatic stress disorder)   . PTSD (post-traumatic stress disorder)   . Pulmonary nodule 11/26/2016   70mm subpleural right lower lobe, noted on CT ABD/PELVIS  . Right shoulder tendinitis   . Sleep apnea    Stop Bang score of 5  . Stroke [facility]) 2016   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    Surgical History: Past Surgical History:  Procedure Laterality Date  . CATARACT EXTRACTION W/PHACO  07/20/2011   Procedure: CATARACT EXTRACTION PHACO AND INTRAOCULAR LENS PLACEMENT (IOC);  Surgeon: Williams Che, MD;   Location: [facility];  Service: Ophthalmology;  Laterality: Right;  CDE=25.73  . CATARACT EXTRACTION W/PHACO  08/31/2011   Procedure: CATARACT EXTRACTION PHACO AND INTRAOCULAR LENS PLACEMENT (IOC);  Surgeon: Williams Che, MD;  Location: [facility];  Service: Ophthalmology;  Laterality: Left;  CDE: 38.59  . COLONOSCOPY N/A 09/10/2016   Procedure: COLONOSCOPY;  Surgeon: Rogene Houston, MD;  Location: [facility];  Service: Endoscopy;  Laterality: N/A;  1200  . COLONOSCOPY W/ POLYPECTOMY  2011   [facility]-Dr. Rehman  . cortisone injection Left 01/13/2016   left shoulder  . cortisone injection Left 09/21/2017   left shoulder  . CYSTOSCOPY  01/20/2018   Procedure: CYSTOSCOPY FLEXIBLE;  Surgeon: Cleon Gustin, MD;  Location[facility];  Service: Urology;;  no seeds found in bladder  . EYE SURGERY     laser surgery to repair retina tare  . head reconstruction  1966   due to head injury in war  . LIPOMA EXCISION Left 02/01/2019   Procedure: EXCISION LIPOMA LEFT UPPER EXTREMITY;  Surgeon: Aviva Signs, MD;  Location: [facility];  Service: General;  Laterality: Left;  . PROSTATE BIOPSY  05/20/2016  . RADIOACTIVE SEED IMPLANT N/A 01/20/2018   Procedure: RADIOACTIVE SEED IMPLANT/BRACHYTHERAPY IMPLANT;  Surgeon: Cleon Gustin, MD;  Location[facility];  Service: Urology;  Laterality: N/A;     88    seeds implanted  . REFRACTIVE SURGERY Left 03/05/2017   to resolve cloudiness  . SPACE OAR INSTILLATION N/A 01/20/2018   Procedure: SPACE OAR INSTILLATION;  Surgeon: Cleon Gustin, MD;  Location: [facility];  Service: Urology;  Laterality: N/A;  . TRIGGER FINGER RELEASE      Home Medications:  Allergies as of 07/24/2020      Reactions   Rosuvastatin Other (See Comments)   Penicillin G Rash   Penicillins Rash, Other (See Comments)   Childhood allergy Has patient had a PCN reaction causing immediate rash, facial/tongue/throat swelling, SOB  or lightheadedness with hypotension: yes Has patient had a PCN reaction causing severe rash involving mucus membranes or skin necrosis: no Has patient had a PCN reaction that required hospitalization no Has patient had a PCN reaction occurring within the last 10 years: no If all of the above answers are "NO", then may proceed with Cephalosporin use.      Medication List       Accurate as of July 24, 2020  9:43 AM. If you have any questions, ask your nurse or doctor.        STOP taking these medications   Turmeric 500 MG Caps Stopped by: Nicolette Bang, MD   vitamin E 1000 UNIT capsule Stopped by: Nicolette Bang, MD     TAKE these medications   aspirin 81 MG tablet Take 1 tablet (81 mg total) by mouth daily.   gabapentin 100 MG capsule Commonly known as: NEURONTIN SMARTSIG:1 Capsule(s) By Mouth 1 to 3 Times Daily   gabapentin 300 MG capsule Commonly known as: NEURONTIN Take 1 capsule by mouth 2 (two) times daily.   losartan-hydrochlorothiazide 50-12.5 MG tablet Commonly known as: HYZAAR Take 1 tablet by mouth daily.   losartan-hydrochlorothiazide 100-12.5 MG tablet Commonly known as: HYZAAR Take 1 tablet by mouth every morning.   naproxen 500 MG tablet Commonly known as: NAPROSYN Take 250 mg by mouth daily.   ONE TOUCH ULTRA TEST test strip Generic drug: glucose blood   onetouch ultrasoft lancets   potassium chloride SA 20 MEQ tablet Commonly known as: KLOR-CON Take 20 mEq by mouth daily as needed (cramping).   tamsulosin 0.4 MG Caps capsule Commonly known as: FLOMAX Take 1 capsule (0.4 mg total) by mouth daily after supper.   VITAMIN B-3 PO Take by mouth.   Vitamin D3 125 MCG (5000 UT) Caps Take 5,000 Units by mouth daily.       Allergies:  Allergies  Allergen Reactions  . Rosuvastatin Other (See Comments)  . Penicillin G Rash  . Penicillins Rash and Other (See Comments)    Childhood allergy Has patient had a PCN reaction causing  immediate rash, facial/tongue/throat swelling, SOB or lightheadedness with hypotension: yes Has patient had a PCN reaction causing severe rash involving mucus membranes or skin necrosis: no Has patient had a PCN reaction that required hospitalization no Has patient had a PCN reaction occurring within the last 10 years: no If all of the above answers are "NO", then may proceed with Cephalosporin use.      Family History: Family History  Problem Relation Age of Onset  . Diabetes Mother   . Hypertension Father   . Heart disease Father        before age 74  . Heart attack Father   . Pancreatic cancer Brother   . Anesthesia problems Neg Hx   . Hypotension  Neg Hx   . Malignant hyperthermia Neg Hx   . Pseudochol deficiency Neg Hx     Social History:  reports that he quit smoking about 44 years ago. His smoking use included cigarettes. He has a 13.00 pack-year smoking history. He has never used smokeless tobacco. He reports current alcohol use of about 4.0 - 6.0 standard drinks of alcohol per week. He reports that he does not use drugs.  ROS: All other review of systems were reviewed and are negative except what is noted above in HPI  Physical Exam: There were no vitals taken for this visit.  Constitutional:  Alert and oriented, No acute distress. HEENT: [place] AT, moist mucus membranes.  Trachea midline, no masses. Cardiovascular: No clubbing, cyanosis, or edema. Respiratory: Normal respiratory effort, no increased work of breathing. GI: Abdomen is soft, nontender, nondistended, no abdominal masses GU: No CVA tenderness.  Lymph: No cervical or inguinal lymphadenopathy. Skin: No rashes, bruises or suspicious lesions. Neurologic: Grossly intact, no focal deficits, moving all 4 extremities. Psychiatric: Normal mood and affect.  Laboratory Data: Lab Results  Component Value Date   WBC 6.0 01/13/2018   HGB 16.5 01/13/2018   HCT 48.5 01/13/2018   MCV 91.7 01/13/2018   PLT 171  01/13/2018    Lab Results  Component Value Date   CREATININE 1.04 01/30/2019    No results found for: PSA  No results found for: TESTOSTERONE  Lab Results  Component Value Date   HGBA1C 6.2 (H) 01/30/2019    Urinalysis    Component Value Date/Time   APPEARANCEUR Clear 01/24/2020 1137   GLUCOSEU Negative 01/24/2020 1137   BILIRUBINUR Negative 01/24/2020 1137   PROTEINUR Negative 01/24/2020 1137   NITRITE Negative 01/24/2020 1137   LEUKOCYTESUR Negative 01/24/2020 1137    Lab Results  Component Value Date   LABMICR See below: 01/24/2020   WBCUA None seen 01/24/2020   LABEPIT None seen 01/24/2020   BACTERIA None seen 01/24/2020    Pertinent Imaging:  No results found for this or any previous visit.  No results found for this or any previous visit.  No results found for this or any previous visit.  No results found for this or any previous visit.  No results found for this or any previous visit.  No results found for this or any previous visit.  No results found for this or any previous visit.  No results found for this or any previous visit.   Assessment & Plan:    1. Prostate cancer ([place]) -RTC 6 months with PSA  2. Benign prostatic hyperplasia with urinary obstruction -continue flomax 0.4mg  daily and add tadalafil 5mg  daily - Urinalysis, Routine w reflex microscopic - BLADDER SCAN AMB NON-IMAGING  3. Erectile dysfunction -tadalafil 20mg  prn   No follow-ups on file.  Nicolette Bang, MD  [facility] [place]

## 2020-07-24 NOTE — Telephone Encounter (Signed)
[facility] called about a prescription for Tadalafil for this patient. Pt state he takes both 5mg  and 20mg . Pharmacy is trying to make sure what he takes.

## 2020-07-24 NOTE — Addendum Note (Signed)
Addended by: Cleon Gustin on: 07/24/2020 09:58 AM   Modules accepted: Orders

## 2020-07-31 ENCOUNTER — Encounter: Payer: Self-pay | Admitting: Urology

## 2020-07-31 ENCOUNTER — Telehealth: Payer: Self-pay | Admitting: Urology

## 2020-07-31 DIAGNOSIS — N5201 Erectile dysfunction due to arterial insufficiency: Secondary | ICD-10-CM

## 2020-07-31 DIAGNOSIS — N529 Male erectile dysfunction, unspecified: Secondary | ICD-10-CM

## 2020-07-31 HISTORY — DX: Male erectile dysfunction, unspecified: N52.9

## 2020-07-31 NOTE — Telephone Encounter (Signed)
Patient walked into the office today with complaint of adverse reaction to Cialis.  He was seen in the office last week, 07/24/20.    He reports that his blood pressure dropped really low (131/63) and his pulse dropped really low (63) and he was light headed.  He reports that he has not taken any more of the medication.  He is looking for advice.

## 2020-07-31 NOTE — Telephone Encounter (Signed)
Patient came by the office today with complaints of having side effects from taking his cialis.   I educated patient on not taking cialis within two hours of taking his flomax. Pt reported he took the cialis medication at 3pm and then began to experience dizziness, chest pain and abdominal pain.  Pt reports he will no longer take cialis and has requested an ErecAid vacuum device that he previously spoke with Dr. Alyson Ingles about.   I spoke with Dr. Alyson Ingles and order received to send prescription in for ErecAid. Order faxed to [phone]

## 2020-08-20 ENCOUNTER — Ambulatory Visit (HOSPITAL_COMMUNITY)
Admission: RE | Admit: 2020-08-20 | Discharge: 2020-08-20 | Disposition: A | Discharge status: Home / Self Care | Payer: Medicare Other | Source: Ambulatory Visit | Attending: Vascular Surgery | Admitting: Vascular Surgery

## 2020-08-20 ENCOUNTER — Other Ambulatory Visit: Payer: Self-pay

## 2020-08-20 ENCOUNTER — Ambulatory Visit: Payer: Medicare Other | Admitting: Physician Assistant

## 2020-08-20 ENCOUNTER — Encounter: Payer: Self-pay | Admitting: Physician Assistant

## 2020-08-20 VITALS — BP 151/82 | HR 63 | Temp 98.3°F | Resp 20 | Ht 68.0 in | Wt 170.6 lb

## 2020-08-20 DIAGNOSIS — I6521 Occlusion and stenosis of right carotid artery: Secondary | ICD-10-CM

## 2020-08-20 DIAGNOSIS — R69 Illness, unspecified: Secondary | ICD-10-CM | POA: Insufficient documentation

## 2020-08-20 DIAGNOSIS — E7800 Pure hypercholesterolemia, unspecified: Secondary | ICD-10-CM | POA: Insufficient documentation

## 2020-08-20 DIAGNOSIS — M109 Gout, unspecified: Secondary | ICD-10-CM | POA: Insufficient documentation

## 2020-08-20 DIAGNOSIS — R7301 Impaired fasting glucose: Secondary | ICD-10-CM | POA: Insufficient documentation

## 2020-08-20 DIAGNOSIS — R319 Hematuria, unspecified: Secondary | ICD-10-CM | POA: Insufficient documentation

## 2020-08-20 DIAGNOSIS — Z9182 Personal history of military deployment: Secondary | ICD-10-CM | POA: Insufficient documentation

## 2020-08-20 DIAGNOSIS — E119 Type 2 diabetes mellitus without complications: Secondary | ICD-10-CM | POA: Insufficient documentation

## 2020-08-20 DIAGNOSIS — F4310 Post-traumatic stress disorder, unspecified: Secondary | ICD-10-CM | POA: Insufficient documentation

## 2020-08-20 DIAGNOSIS — E785 Hyperlipidemia, unspecified: Secondary | ICD-10-CM | POA: Insufficient documentation

## 2020-08-20 DIAGNOSIS — E1165 Type 2 diabetes mellitus with hyperglycemia: Secondary | ICD-10-CM | POA: Insufficient documentation

## 2020-08-20 DIAGNOSIS — K635 Polyp of colon: Secondary | ICD-10-CM | POA: Insufficient documentation

## 2020-08-20 DIAGNOSIS — I10 Essential (primary) hypertension: Secondary | ICD-10-CM | POA: Insufficient documentation

## 2020-08-20 NOTE — Progress Notes (Signed)
History of Present Illness:  Patient is a 75 y.o. year old male who presents for evaluation of carotid stenosis.  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  The patient denies symptoms of TIA, amaurosis, or stroke.   The pt is not on a statin for cholesterol management.  He states he read the side effects and did not want to take the medication.  The pt is on a daily aspirin.   Other AC:  none The pt is on ARB for hypertension.   The pt is not diabetic.   Tobacco hx:  Former-quit 1977  Past Medical History:  Diagnosis Date  . Aortic atherosclerosis ([place]) 10/12/2017   Noted on CT   . Arthritis    bilateral legs /   Neck-Degenerative Disc Disease  . Carotid artery occlusion    Right Carotid   . Cervical spondylosis   . Cervical stenosis of spine   . Degenerative disc disease, cervical    C4-7  . Diabetes mellitus without complication ([place])    diet and exercise controlled, no medications, hgb A1C 07/31/2017 (6.9)  . Erectile dysfunction 07/31/2020  . External hemorrhoids   . Hepatic cyst 10/12/2017   Low density lession right hepatic lobe, noted on CT  . Hepatitis    Hepatitis C negative 12/05/2014  . History of colon polyps   . Hypertension   . Leg cramps   . Prostate CA ([place])   . Protrusion of intervertebral disc of lumbosacral region    L5-S1  . PTSD (post-traumatic stress disorder)   . PTSD (post-traumatic stress disorder)   . Pulmonary nodule 11/26/2016   79mm subpleural right lower lobe, noted on CT ABD/PELVIS  . Right shoulder tendinitis   . Sleep apnea    Stop Bang score of 5  . Stroke [facility]) 2016   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    Past Surgical History:  Procedure Laterality Date  . CATARACT EXTRACTION W/PHACO  07/20/2011   Procedure: CATARACT EXTRACTION PHACO AND INTRAOCULAR LENS PLACEMENT (IOC);  Surgeon: Williams Che, MD;  Location: [facility];  Service: Ophthalmology;  Laterality: Right;  CDE=25.73  . CATARACT EXTRACTION  W/PHACO  08/31/2011   Procedure: CATARACT EXTRACTION PHACO AND INTRAOCULAR LENS PLACEMENT (IOC);  Surgeon: Williams Che, MD;  Location: [facility];  Service: Ophthalmology;  Laterality: Left;  CDE: 38.59  . COLONOSCOPY N/A 09/10/2016   Procedure: COLONOSCOPY;  Surgeon: Rogene Houston, MD;  Location[facility];  Service: Endoscopy;  Laterality: N/A;  1200  . COLONOSCOPY W/ POLYPECTOMY  2011   [facility]-Dr. Rehman  . cortisone injection Left 01/13/2016   left shoulder  . cortisone injection Left 09/21/2017   left shoulder  . CYSTOSCOPY  01/20/2018   Procedure: CYSTOSCOPY FLEXIBLE;  Surgeon: Cleon Gustin, MD;  Location: [facility];  Service: Urology;;  no seeds found in bladder  . EYE SURGERY     laser surgery to repair retina tare  . head reconstruction  1966   due to head injury in war  . LIPOMA EXCISION Left 02/01/2019   Procedure: EXCISION LIPOMA LEFT UPPER EXTREMITY;  Surgeon: Aviva Signs, MD;  Location: [facility];  Service: General;  Laterality: Left;  . PROSTATE BIOPSY  05/20/2016  . RADIOACTIVE SEED IMPLANT N/A 01/20/2018   Procedure: RADIOACTIVE SEED IMPLANT/BRACHYTHERAPY IMPLANT;  Surgeon: Cleon Gustin, MD;  Location: [facility];  Service: Urology;  Laterality: N/A;  88    seeds implanted  . REFRACTIVE SURGERY Left 03/05/2017   to resolve cloudiness  . SPACE OAR INSTILLATION N/A 01/20/2018   Procedure: SPACE OAR INSTILLATION;  Surgeon: Cleon Gustin, MD;  Location: [facility];  Service: Urology;  Laterality: N/A;  . TRIGGER FINGER RELEASE       Social History Social History   Tobacco Use  . Smoking status: Former Smoker    Packs/day: 1.00    Years: 13.00    Pack years: 13.00    Types: Cigarettes    Quit date: 04/17/1976    Years since quitting: 44.3  . Smokeless tobacco: Never Used  Vaping Use  . Vaping Use: Never used  Substance Use Topics  . Alcohol use: Yes    Alcohol/week: 4.0 - 6.0  standard drinks    Types: 4 - 6 Shots of liquor per week    Comment: 2-3 mixed drinks on a saturday night  . Drug use: Never    Family History Family History  Problem Relation Age of Onset  . Diabetes Mother   . Hypertension Father   . Heart disease Father        before age 54  . Heart attack Father   . Pancreatic cancer Brother   . Anesthesia problems Neg Hx   . Hypotension Neg Hx   . Malignant hyperthermia Neg Hx   . Pseudochol deficiency Neg Hx     Allergies  Allergies  Allergen Reactions  . Rosuvastatin Other (See Comments)  . Penicillin G Rash  . Penicillins Rash and Other (See Comments)    Childhood allergy Has patient had a PCN reaction causing immediate rash, facial/tongue/throat swelling, SOB or lightheadedness with hypotension: yes Has patient had a PCN reaction causing severe rash involving mucus membranes or skin necrosis: no Has patient had a PCN reaction that required hospitalization no Has patient had a PCN reaction occurring within the last 10 years: no If all of the above answers are "NO", then may proceed with Cephalosporin use.       Current Outpatient Medications  Medication Sig Dispense Refill  . aspirin 81 MG tablet Take 1 tablet (81 mg total) by mouth daily. 30 tablet   . Cholecalciferol (VITAMIN D3) 5000 UNITS CAPS Take 5,000 Units by mouth daily.     Marland Kitchen gabapentin (NEURONTIN) 100 MG capsule SMARTSIG:1 Capsule(s) By Mouth 1 to 3 Times Daily    . gabapentin (NEURONTIN) 300 MG capsule Take 1 capsule by mouth 2 (two) times daily.    . Lancets (ONETOUCH ULTRASOFT) lancets     . losartan-hydrochlorothiazide (HYZAAR) 100-12.5 MG tablet Take 1 tablet by mouth every morning.    Marland Kitchen losartan-hydrochlorothiazide (HYZAAR) 50-12.5 MG tablet Take 1 tablet by mouth daily.    . naproxen (NAPROSYN) 500 MG tablet Take 250 mg by mouth daily.  (Patient not taking: No sig reported)    . Niacin (VITAMIN B-3 PO) Take by mouth.    . ONE TOUCH ULTRA TEST test strip      . potassium chloride SA (K-DUR,KLOR-CON) 20 MEQ tablet Take 20 mEq by mouth daily as needed (cramping).     . tadalafil (CIALIS) 20 MG tablet Take 1 tablet (20 mg total) by mouth daily as needed. 10 tablet 5  . tadalafil (CIALIS) 5 MG tablet Take 1 tablet (5 mg total) by mouth daily as needed for erectile dysfunction. 30 tablet 11  . tamsulosin (FLOMAX) 0.4 MG CAPS capsule Take 1 capsule (0.4 mg total) by  mouth daily after supper. 90 capsule 3   No current facility-administered medications for this visit.    ROS:   General:  No weight loss, Fever, chills  HEENT: No recent headaches, no nasal bleeding, no visual changes, no sore throat  Neurologic: No dizziness, blackouts, seizures. No recent symptoms of stroke or mini- stroke. No recent episodes of slurred speech, or temporary blindness.  Cardiac: No recent episodes of chest pain/pressure, no shortness of breath at rest.  No shortness of breath with exertion.  Denies history of atrial fibrillation or irregular heartbeat  Vascular: No history of rest pain in feet.  No history of claudication.  No history of non-healing ulcer, No history of DVT   Pulmonary: No home oxygen, no productive cough, no hemoptysis,  No asthma or wheezing  Musculoskeletal:  [ ]  Arthritis, [ ]  Low back pain,  [ ]  Joint pain  Hematologic:No history of hypercoagulable state.  No history of easy bleeding.  No history of anemia  Gastrointestinal: No hematochezia or melena,  No gastroesophageal reflux, no trouble swallowing  Urinary: [ ]  chronic Kidney disease, [ ]  on HD - [ ]  MWF or [ ]  TTHS, [ ]  Burning with urination, [ ]  Frequent urination, [ ]  Difficulty urinating;   Skin: No rashes  Psychological: No history of anxiety,  No history of depression   Physical Examination  Vitals:   08/20/20 1145 08/20/20 1146  BP: 140/84 (!) 151/82  Pulse: 63   Resp: 20   Temp: 98.3 F (36.8 C)   TempSrc: Temporal   SpO2: 99%   Weight: 170 lb 9.6 oz (77.4 kg)    Height: 5\' 8"  (1.727 m)     Body mass index is 25.94 kg/m.  General:  Alert and oriented, no acute distress HEENT: Normal Neck: No bruit or JVD Pulmonary: Clear to auscultation bilaterally Cardiac: Regular Rate and Rhythm without murmur Gastrointestinal: Soft, non-tender, non-distended, no mass, no scars Skin: No rash Musculoskeletal: No deformity or edema  Neurologic: Upper and lower extremity motor 5/5 and symmetric  DATA:    Right Carotid Findings:  +----------+--------+--------+--------+--------------------------+--------+        PSV cm/sEDV cm/sStenosisPlaque Description      Comments  +----------+--------+--------+--------+--------------------------+--------+   CCA Prox 113   19                            +----------+--------+--------+--------+--------------------------+--------+   CCA Mid  130   29       homogeneous and hypoechoic        +----------+--------+--------+--------+--------------------------+--------+   CCA Distal120   23       homogeneous and hypoechoic        +----------+--------+--------+--------+--------------------------+--------+   ICA Prox 148   44   40-59% homogeneous                +----------+--------+--------+--------+--------------------------+--------+   ICA Mid  127   25                            +----------+--------+--------+--------+--------------------------+--------+   ICA Distal75   27                            +----------+--------+--------+--------+--------------------------+--------+   ECA    224   24   >50%                       +----------+--------+--------+--------+--------------------------+--------+    +----------+--------+-------+----------------+-------------------+  PSV cm/sEDV  cmsDescribe    Arm Pressure (mmHG)  +----------+--------+-------+----------------+-------------------+  [id number]       Multiphasic, WNL            +----------+--------+-------+----------------+-------------------+   +---------+--------+--+--------+-+---------+  VertebralPSV cm/s19EDV cm/s3Antegrade  +---------+--------+--+--------+-+---------+      Left Carotid Findings:  +----------+--------+--------+--------+------------------+--------+       PSV cm/sEDV cm/sStenosisPlaque DescriptionComments  +----------+--------+--------+--------+------------------+--------+  CCA Prox 117   25                      +----------+--------+--------+--------+------------------+--------+  CCA Mid  179   35       heterogenous         +----------+--------+--------+--------+------------------+--------+  CCA Distal167   36       heterogenous         +----------+--------+--------+--------+------------------+--------+  ICA Prox 64   13   1-39%  heterogenous         +----------+--------+--------+--------+------------------+--------+  ICA Mid  70   22                      +----------+--------+--------+--------+------------------+--------+  ICA Distal50   18                      +----------+--------+--------+--------+------------------+--------+  ECA    162   15       heterogenous         +----------+--------+--------+--------+------------------+--------+   +----------+--------+--------+----------------+-------------------+       PSV cm/sEDV cm/sDescribe    Arm Pressure (mmHG)  +----------+--------+--------+----------------+-------------------+  [id number]       Multiphasic, WNL             +----------+--------+--------+----------------+-------------------+   +---------+--------+--+--------+--+---------+  VertebralPSV cm/s56EDV cm/s10Antegrade  +---------+--------+--+--------+--+---------+     Summary:  Right Carotid: Velocities in the right ICA are consistent with a 40-59%         stenosis. The ECA appears >50% stenosed. Stenosis category  by         velocity criteria. Plaque morphology suggests a higher  degree of         narrowing.   Left Carotid: Velocities in the left ICA are consistent with a 1-39%  stenosis.   Vertebrals: Bilateral vertebral arteries demonstrate antegrade flow.  Subclavians: Normal flow hemodynamics were seen in bilateral subclavian        arteries.   ASSESSMENT:  75 y.o. male here for follow up carotid artery stenosis with hx of left brain stroke who is following up today for his annual carotid duplex. Right ICA 40-59% stenosis  Left < 39% stenosis  PLAN:He remain asymptomatic and his duplex does not show significant change in stenosis.  He will stay active, eat health and f/u in 1 year for repeat carotid duplex.    Roxy Horseman PA-C [facility] Office: [phone]  MD in office Carlis Abbott

## 2020-11-21 DIAGNOSIS — M109 Gout, unspecified: Secondary | ICD-10-CM | POA: Diagnosis not present

## 2020-11-21 DIAGNOSIS — E1140 Type 2 diabetes mellitus with diabetic neuropathy, unspecified: Secondary | ICD-10-CM | POA: Diagnosis not present

## 2020-11-26 DIAGNOSIS — M109 Gout, unspecified: Secondary | ICD-10-CM | POA: Diagnosis not present

## 2020-11-26 DIAGNOSIS — E1140 Type 2 diabetes mellitus with diabetic neuropathy, unspecified: Secondary | ICD-10-CM | POA: Diagnosis not present

## 2020-11-26 DIAGNOSIS — E039 Hypothyroidism, unspecified: Secondary | ICD-10-CM | POA: Diagnosis not present

## 2020-11-26 DIAGNOSIS — J449 Chronic obstructive pulmonary disease, unspecified: Secondary | ICD-10-CM | POA: Diagnosis not present

## 2020-11-26 DIAGNOSIS — Z1389 Encounter for screening for other disorder: Secondary | ICD-10-CM | POA: Diagnosis not present

## 2020-11-26 DIAGNOSIS — I10 Essential (primary) hypertension: Secondary | ICD-10-CM | POA: Diagnosis not present

## 2020-11-26 DIAGNOSIS — Z0001 Encounter for general adult medical examination with abnormal findings: Secondary | ICD-10-CM | POA: Diagnosis not present

## 2020-11-26 DIAGNOSIS — R7309 Other abnormal glucose: Secondary | ICD-10-CM | POA: Diagnosis not present

## 2020-11-26 DIAGNOSIS — E782 Mixed hyperlipidemia: Secondary | ICD-10-CM | POA: Diagnosis not present

## 2021-01-22 ENCOUNTER — Other Ambulatory Visit: Payer: Medicare Other

## 2021-01-29 ENCOUNTER — Ambulatory Visit: Payer: Medicare Other | Admitting: Urology

## 2021-02-10 DIAGNOSIS — Z23 Encounter for immunization: Secondary | ICD-10-CM | POA: Diagnosis not present

## 2021-02-12 ENCOUNTER — Other Ambulatory Visit: Payer: Medicare Other

## 2021-02-12 ENCOUNTER — Other Ambulatory Visit: Payer: Self-pay

## 2021-02-12 DIAGNOSIS — C61 Malignant neoplasm of prostate: Secondary | ICD-10-CM

## 2021-02-13 LAB — PSA: Prostate Specific Ag, Serum: 0.2 ng/mL (ref 0.0–4.0)

## 2021-02-18 NOTE — Progress Notes (Signed)
Sent via mail 

## 2021-02-19 ENCOUNTER — Encounter: Payer: Self-pay | Admitting: Urology

## 2021-02-19 ENCOUNTER — Ambulatory Visit: Payer: Medicare Other | Admitting: Urology

## 2021-02-19 ENCOUNTER — Other Ambulatory Visit: Payer: Self-pay

## 2021-02-19 VITALS — BP 163/78 | HR 88 | Ht 68.0 in | Wt 172.1 lb

## 2021-02-19 DIAGNOSIS — C61 Malignant neoplasm of prostate: Secondary | ICD-10-CM | POA: Diagnosis not present

## 2021-02-19 DIAGNOSIS — N138 Other obstructive and reflux uropathy: Secondary | ICD-10-CM

## 2021-02-19 DIAGNOSIS — R351 Nocturia: Secondary | ICD-10-CM

## 2021-02-19 DIAGNOSIS — N5201 Erectile dysfunction due to arterial insufficiency: Secondary | ICD-10-CM | POA: Diagnosis not present

## 2021-02-19 DIAGNOSIS — N401 Enlarged prostate with lower urinary tract symptoms: Secondary | ICD-10-CM

## 2021-02-19 LAB — URINALYSIS, ROUTINE W REFLEX MICROSCOPIC
Bilirubin, UA: NEGATIVE
Glucose, UA: NEGATIVE
Ketones, UA: NEGATIVE
Leukocytes,UA: NEGATIVE
Nitrite, UA: NEGATIVE
Protein,UA: NEGATIVE
Specific Gravity, UA: 1.02 (ref 1.005–1.030)
Urobilinogen, Ur: 0.2 mg/dL (ref 0.2–1.0)
pH, UA: 6.5 (ref 5.0–7.5)

## 2021-02-19 LAB — MICROSCOPIC EXAMINATION
Bacteria, UA: NONE SEEN
Epithelial Cells (non renal): NONE SEEN /hpf (ref 0–10)
RBC, Urine: NONE SEEN /hpf (ref 0–2)
Renal Epithel, UA: NONE SEEN /hpf
WBC, UA: NONE SEEN /hpf (ref 0–5)

## 2021-02-19 MED ORDER — TAMSULOSIN HCL 0.4 MG PO CAPS: 0.4000 mg | ORAL_CAPSULE | Freq: Every day | ORAL | 3 refills | Status: DC

## 2021-02-19 MED ORDER — TADALAFIL 20 MG PO TABS: 20.0000 mg | ORAL_TABLET | Freq: Every day | ORAL | 5 refills | Status: DC | PRN

## 2021-02-19 MED ORDER — GEMTESA 75 MG PO TABS: 1.0000 | ORAL_TABLET | Freq: Every day | ORAL | 0 refills | Status: DC

## 2021-02-19 NOTE — Progress Notes (Signed)
Urological Symptom Review  Patient is experiencing the following symptoms: Frequent urination Hard to postpone urination Burning/pain with urination Get up at night to urinate Leakage of urine Weak stream Erection problems (male only) Penile pain (male only)    Review of Systems  Gastrointestinal (upper)  : Negative for upper GI symptoms  Gastrointestinal (lower) : Negative for lower GI symptoms  Constitutional : Negative for symptoms  Skin: Negative for skin symptoms  Eyes: Negative for eye symptoms  Ear/Nose/Throat : Negative for Ear/Nose/Throat symptoms  Hematologic/Lymphatic: Negative for Hematologic/Lymphatic symptoms  Cardiovascular : Negative for cardiovascular symptoms  Respiratory : Negative for respiratory symptoms  Endocrine: Negative for endocrine symptoms  Musculoskeletal: Back pain Joint pain  Neurological: Negative for neurological symptoms  Psychologic: Negative for psychiatric symptoms

## 2021-02-19 NOTE — Patient Instructions (Signed)

## 2021-02-19 NOTE — Addendum Note (Signed)
Addended by: Dorisann Frames on: 02/19/2021 11:14 AM   Modules accepted: Orders

## 2021-02-19 NOTE — Progress Notes (Signed)
02/19/2021 10:47 AM   Robert Zhang January 07, 1946 [id number]  Referring provider: Sharilyn Sites, MD [address] [place],  [place] [address]  Followup prostate cancer   HPI: Robert Zhang is a 75yo here for followup for BPH, prostate cancer, and erectile dysfunction. PSA stable at 0.2. IPSS 26 QOl 5. He has urinary frequency every 30-60 minutes. Nocturia 5x. He has urinary urgency and urge incontinence. He wears 3-4 pullups a day. He is currently on flomax 0.4mg  daily. He does not have glaucoma. He takes cialis and VED for his ED which works intermittely.    PMH: Past Medical History:  Diagnosis Date   Aortic atherosclerosis ([place]) 10/12/2017   Noted on CT    Arthritis    bilateral legs /   Neck-Degenerative Disc Disease   Carotid artery occlusion    Right Carotid    Cervical spondylosis    Cervical stenosis of spine    Degenerative disc disease, cervical    C4-7   Diabetes mellitus without complication ([facility])    diet and exercise controlled, no medications, hgb A1C 07/31/2017 (6.9)   Erectile dysfunction 07/31/2020   External hemorrhoids    Hepatic cyst 10/12/2017   Low density lession right hepatic lobe, noted on CT   Hepatitis    Hepatitis C negative 12/05/2014   History of colon polyps    Hypertension    Leg cramps    Prostate CA ([name])    Protrusion of intervertebral disc of lumbosacral region    L5-S1   PTSD (post-traumatic stress disorder)    PTSD (post-traumatic stress disorder)    Pulmonary nodule 11/26/2016   58mm subpleural right lower lobe, noted on CT ABD/PELVIS   Right shoulder tendinitis    Sleep apnea    Stop Bang score of 5   Stroke ([name]) 2016   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    Surgical History: Past Surgical History:  Procedure Laterality Date   CATARACT EXTRACTION W/PHACO  07/20/2011   Procedure: CATARACT EXTRACTION PHACO AND INTRAOCULAR LENS PLACEMENT ([place]);  Surgeon: Williams Che, MD;  Location: [facility];  Service: Ophthalmology;   Laterality: Right;  CDE=25.73   CATARACT EXTRACTION W/PHACO  08/31/2011   Procedure: CATARACT EXTRACTION PHACO AND INTRAOCULAR LENS PLACEMENT (IOC);  Surgeon: Williams Che, MD;  Location: [facility];  Service: Ophthalmology;  Laterality: Left;  CDE: 38.59   COLONOSCOPY N/A 09/10/2016   Procedure: COLONOSCOPY;  Surgeon: Rogene Houston, MD;  [facility];  Service: Endoscopy;  Laterality: N/A;  1200   COLONOSCOPY W/ POLYPECTOMY  2011   [facility]-Dr. Rehman   cortisone injection Left 01/13/2016   left shoulder   cortisone injection Left 09/21/2017   left shoulder   CYSTOSCOPY  01/20/2018   Procedure: CYSTOSCOPY FLEXIBLE;  Surgeon: Cleon Gustin, MD;  [facility];  Service: Urology;;  no seeds found in bladder   EYE SURGERY     laser surgery to repair retina tare   head reconstruction  1966   due to head injury in war   LIPOMA EXCISION Left 02/01/2019   Procedure: EXCISION LIPOMA LEFT UPPER EXTREMITY;  Surgeon: Aviva Signs, MD;  Location: [facility];  Service: General;  Laterality: Left;   PROSTATE BIOPSY  05/20/2016   RADIOACTIVE SEED IMPLANT N/A 01/20/2018   Procedure: RADIOACTIVE SEED IMPLANT/BRACHYTHERAPY IMPLANT;  Surgeon: Cleon Gustin, MD;  Location: [facility];  Service: Urology;  Laterality: N/A;     88  seeds implanted   REFRACTIVE SURGERY Left 03/05/2017   to resolve cloudiness   SPACE OAR INSTILLATION N/A 01/20/2018   Procedure: SPACE OAR INSTILLATION;  Surgeon: Cleon Gustin, MD;  Location: [facility];  Service: Urology;  Laterality: N/A;   TRIGGER FINGER RELEASE      Home Medications:  Allergies as of 02/19/2021       Reactions   Rosuvastatin Other (See Comments)   Penicillin G Rash   Penicillins Rash, Other (See Comments)   Childhood allergy Has patient had a PCN reaction causing immediate rash, facial/tongue/throat swelling, SOB or lightheadedness with hypotension: yes Has patient  had a PCN reaction causing severe rash involving mucus membranes or skin necrosis: no Has patient had a PCN reaction that required hospitalization no Has patient had a PCN reaction occurring within the last 10 years: no If all of the above answers are "NO", then may proceed with Cephalosporin use.        Medication List        Accurate as of February 19, 2021 10:47 AM. If you have any questions, ask your nurse or doctor.          aspirin 81 MG tablet Take 1 tablet (81 mg total) by mouth daily.   gabapentin 100 MG capsule Commonly known as: NEURONTIN SMARTSIG:1 Capsule(s) By Mouth 1 to 3 Times Daily   gabapentin 300 MG capsule Commonly known as: NEURONTIN Take 1 capsule by mouth 2 (two) times daily.   losartan-hydrochlorothiazide 50-12.5 MG tablet Commonly known as: HYZAAR Take 1 tablet by mouth daily.   losartan-hydrochlorothiazide 100-12.5 MG tablet Commonly known as: HYZAAR Take 1 tablet by mouth every morning.   naproxen 500 MG tablet Commonly known as: NAPROSYN Take 250 mg by mouth daily.   ONE TOUCH ULTRA TEST test strip Generic drug: glucose blood   onetouch ultrasoft lancets   potassium chloride SA 20 MEQ tablet Commonly known as: KLOR-CON Take 20 mEq by mouth daily as needed (cramping).   tadalafil 20 MG tablet Commonly known as: CIALIS Take 1 tablet (20 mg total) by mouth daily as needed.   tadalafil 5 MG tablet Commonly known as: CIALIS Take 1 tablet (5 mg total) by mouth daily as needed for erectile dysfunction.   tamsulosin 0.4 MG Caps capsule Commonly known as: FLOMAX Take 1 capsule (0.4 mg total) by mouth daily after supper.   VITAMIN B-3 PO Take by mouth.   Vitamin D3 125 MCG (5000 UT) Caps Take 5,000 Units by mouth daily.        Allergies:  Allergies  Allergen Reactions   Rosuvastatin Other (See Comments)   Penicillin G Rash   Penicillins Rash and Other (See Comments)    Childhood allergy Has patient had a PCN reaction  causing immediate rash, facial/tongue/throat swelling, SOB or lightheadedness with hypotension: yes Has patient had a PCN reaction causing severe rash involving mucus membranes or skin necrosis: no Has patient had a PCN reaction that required hospitalization no Has patient had a PCN reaction occurring within the last 10 years: no If all of the above answers are "NO", then may proceed with Cephalosporin use.      Family History: Family History  Problem Relation Age of Onset   Diabetes Mother    Hypertension Father    Heart disease Father        before age 14   Heart attack Father    Pancreatic cancer Brother    Anesthesia problems Neg Hx  Hypotension Neg Hx    Malignant hyperthermia Neg Hx    Pseudochol deficiency Neg Hx     Social History:  reports that he quit smoking about 44 years ago. His smoking use included cigarettes. He has a 13.00 pack-year smoking history. He has never used smokeless tobacco. He reports current alcohol use of about 4.0 - 6.0 standard drinks per week. He reports that he does not use drugs.  ROS: All other review of systems were reviewed and are negative except what is noted above in HPI  Physical Exam: BP (!) 163/78   Pulse 88   Ht 5\' 8"  (1.727 m)   Wt 172 lb 2 oz (78.1 kg)   BMI 26.17 kg/m   Constitutional:  Alert and oriented, No acute distress. HEENT: [place] AT, moist mucus membranes.  Trachea midline, no masses. Cardiovascular: No clubbing, cyanosis, or edema. Respiratory: Normal respiratory effort, no increased work of breathing. GI: Abdomen is soft, nontender, nondistended, no abdominal masses GU: No CVA tenderness.  Lymph: No cervical or inguinal lymphadenopathy. Skin: No rashes, bruises or suspicious lesions. Neurologic: Grossly intact, no focal deficits, moving all 4 extremities. Psychiatric: Normal mood and affect.  Laboratory Data: Lab Results  Component Value Date   WBC 6.0 01/13/2018   HGB 16.5 01/13/2018   HCT 48.5 01/13/2018    MCV 91.7 01/13/2018   PLT 171 01/13/2018    Lab Results  Component Value Date   CREATININE 1.04 01/30/2019    No results found for: PSA  No results found for: TESTOSTERONE  Lab Results  Component Value Date   HGBA1C 6.2 (H) 01/30/2019    Urinalysis    Component Value Date/Time   APPEARANCEUR Clear 07/24/2020 1013   GLUCOSEU Negative 07/24/2020 1013   BILIRUBINUR Negative 07/24/2020 1013   PROTEINUR Negative 07/24/2020 1013   NITRITE Negative 07/24/2020 1013   LEUKOCYTESUR Negative 07/24/2020 1013    Lab Results  Component Value Date   LABMICR See below: 07/24/2020   WBCUA None seen 07/24/2020   LABEPIT None seen 07/24/2020   BACTERIA None seen 07/24/2020    Pertinent Imaging:  No results found for this or any previous visit.  No results found for this or any previous visit.  No results found for this or any previous visit.  No results found for this or any previous visit.  No results found for this or any previous visit.  No results found for this or any previous visit.  No results found for this or any previous visit.  No results found for this or any previous visit.   Assessment & Plan:    1. Prostate cancer ([place]) -RTC 6 months with PSA  2. Benign prostatic hyperplasia with urinary obstruction -Continue flomax  3. Nocturia -We will trial gemtesa 75mg  daily. If this fails to improve the ncoturia and urinary frequency we will switch to vesicare  4. Erectile dysfunction due to arterial insufficiency -Tadalafil 20mg  prn   No follow-ups on file.  Nicolette Bang, MD  [facility] [place]

## 2021-04-01 DIAGNOSIS — E1140 Type 2 diabetes mellitus with diabetic neuropathy, unspecified: Secondary | ICD-10-CM | POA: Diagnosis not present

## 2021-04-01 DIAGNOSIS — J449 Chronic obstructive pulmonary disease, unspecified: Secondary | ICD-10-CM | POA: Diagnosis not present

## 2021-04-01 DIAGNOSIS — M109 Gout, unspecified: Secondary | ICD-10-CM | POA: Diagnosis not present

## 2021-04-01 DIAGNOSIS — I10 Essential (primary) hypertension: Secondary | ICD-10-CM | POA: Diagnosis not present

## 2021-04-22 DIAGNOSIS — L820 Inflamed seborrheic keratosis: Secondary | ICD-10-CM | POA: Diagnosis not present

## 2021-04-22 DIAGNOSIS — L570 Actinic keratosis: Secondary | ICD-10-CM | POA: Diagnosis not present

## 2021-04-22 DIAGNOSIS — L728 Other follicular cysts of the skin and subcutaneous tissue: Secondary | ICD-10-CM | POA: Diagnosis not present

## 2021-04-22 DIAGNOSIS — X32XXXA Exposure to sunlight, initial encounter: Secondary | ICD-10-CM | POA: Diagnosis not present

## 2021-04-22 DIAGNOSIS — L720 Epidermal cyst: Secondary | ICD-10-CM | POA: Diagnosis not present

## 2021-08-13 ENCOUNTER — Other Ambulatory Visit: Payer: Self-pay

## 2021-08-13 ENCOUNTER — Other Ambulatory Visit: Payer: Medicare Other

## 2021-08-13 DIAGNOSIS — C61 Malignant neoplasm of prostate: Secondary | ICD-10-CM

## 2021-08-14 DIAGNOSIS — E1140 Type 2 diabetes mellitus with diabetic neuropathy, unspecified: Secondary | ICD-10-CM | POA: Diagnosis not present

## 2021-08-14 DIAGNOSIS — B351 Tinea unguium: Secondary | ICD-10-CM | POA: Diagnosis not present

## 2021-08-14 DIAGNOSIS — I10 Essential (primary) hypertension: Secondary | ICD-10-CM | POA: Diagnosis not present

## 2021-08-14 DIAGNOSIS — J449 Chronic obstructive pulmonary disease, unspecified: Secondary | ICD-10-CM | POA: Diagnosis not present

## 2021-08-14 DIAGNOSIS — Z0001 Encounter for general adult medical examination with abnormal findings: Secondary | ICD-10-CM | POA: Diagnosis not present

## 2021-08-14 DIAGNOSIS — M109 Gout, unspecified: Secondary | ICD-10-CM | POA: Diagnosis not present

## 2021-08-14 LAB — PSA: Prostate Specific Ag, Serum: 0.2 ng/mL (ref 0.0–4.0)

## 2021-08-17 ENCOUNTER — Other Ambulatory Visit: Payer: Self-pay

## 2021-08-17 DIAGNOSIS — I6521 Occlusion and stenosis of right carotid artery: Secondary | ICD-10-CM

## 2021-08-19 NOTE — Progress Notes (Signed)
Letter sent.

## 2021-08-20 ENCOUNTER — Ambulatory Visit (INDEPENDENT_AMBULATORY_CARE_PROVIDER_SITE_OTHER): Payer: Medicare Other | Admitting: Urology

## 2021-08-20 ENCOUNTER — Encounter: Payer: Self-pay | Admitting: Urology

## 2021-08-20 VITALS — BP 138/71 | HR 90

## 2021-08-20 DIAGNOSIS — N138 Other obstructive and reflux uropathy: Secondary | ICD-10-CM

## 2021-08-20 DIAGNOSIS — C61 Malignant neoplasm of prostate: Secondary | ICD-10-CM

## 2021-08-20 DIAGNOSIS — N5201 Erectile dysfunction due to arterial insufficiency: Secondary | ICD-10-CM

## 2021-08-20 DIAGNOSIS — R351 Nocturia: Secondary | ICD-10-CM | POA: Diagnosis not present

## 2021-08-20 DIAGNOSIS — N401 Enlarged prostate with lower urinary tract symptoms: Secondary | ICD-10-CM | POA: Diagnosis not present

## 2021-08-20 LAB — URINALYSIS, ROUTINE W REFLEX MICROSCOPIC
Bilirubin, UA: NEGATIVE
Glucose, UA: NEGATIVE
Ketones, UA: NEGATIVE
Leukocytes,UA: NEGATIVE
Nitrite, UA: NEGATIVE
Protein,UA: NEGATIVE
RBC, UA: NEGATIVE
Specific Gravity, UA: 1.025 (ref 1.005–1.030)
Urobilinogen, Ur: 0.2 mg/dL (ref 0.2–1.0)
pH, UA: 7 (ref 5.0–7.5)

## 2021-08-20 MED ORDER — TAMSULOSIN HCL 0.4 MG PO CAPS: 0.4000 mg | ORAL_CAPSULE | Freq: Every day | ORAL | 3 refills | Status: AC

## 2021-08-20 MED ORDER — TADALAFIL 5 MG PO TABS: 5.0000 mg | ORAL_TABLET | Freq: Every day | ORAL | 11 refills | Status: DC | PRN

## 2021-08-20 MED ORDER — TADALAFIL 20 MG PO TABS: 20.0000 mg | ORAL_TABLET | Freq: Every day | ORAL | 5 refills | Status: DC | PRN

## 2021-08-20 NOTE — Progress Notes (Signed)
? ?08/20/2021 ?10:33 AM  ? ?Robert Zhang ?Aug 31, 1945 ?[id number] ? ?Referring provider: Sharilyn Sites, MD ?[address] ?[place],  [place] [address] ? ?Followup prostate cancer, BPH and erectile dysfunction ? ? ?HPI: ?Robert Zhang is a 76yo here for followup for prostate cancer, BPH and ED. PSA stable at 0.2. IPSS 26 QOL. He has urinary frequency, urgency and urge incontinence. He is not bothered by his LUTS. He takes flomax 0.'4mg'$  daily and tadalafil '5mg'$  daily. He takes tadalafil '20mg'$  prn and a VED which works fair. No other complaints ? ? ?PMH: ?Past Medical History:  ?Diagnosis Date  ? Aortic atherosclerosis ([place]) 10/12/2017  ? Noted on CT   ? Arthritis   ? bilateral legs /   Neck-Degenerative Disc Disease  ? Carotid artery occlusion   ? Right Carotid   ? Cervical spondylosis   ? Cervical stenosis of spine   ? Degenerative disc disease, cervical   ? C4-7  ? Diabetes mellitus without complication ([place])   ? diet and exercise controlled, no medications, hgb A1C 07/31/2017 (6.9)  ? Erectile dysfunction 07/31/2020  ? External hemorrhoids   ? Hepatic cyst 10/12/2017  ? Low density lession right hepatic lobe, noted on CT  ? Hepatitis   ? Hepatitis C negative 12/05/2014  ? History of colon polyps   ? Hypertension   ? Leg cramps   ? Prostate CA [facility])   ? Protrusion of intervertebral disc of lumbosacral region   ? L5-S1  ? PTSD (post-traumatic stress disorder)   ? PTSD (post-traumatic stress disorder)   ? Pulmonary nodule 11/26/2016  ? 40m subpleural right lower lobe, noted on CT ABD/PELVIS  ? Right shoulder tendinitis   ? Sleep apnea   ? Stop Bang score of 5  ? Stroke ([facility] 2016  ? 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  ? ? ?Surgical History: ?Past Surgical History:  ?Procedure Laterality Date  ? CATARACT EXTRACTION W/PHACO  07/20/2011  ? Procedure: CATARACT EXTRACTION PHACO AND INTRAOCULAR LENS PLACEMENT (IOC);  Surgeon: CWilliams Che MD;  Location: [facility];  Service: Ophthalmology;  Laterality: Right;  CDE=25.73  ?  CATARACT EXTRACTION W/PHACO  08/31/2011  ? Procedure: CATARACT EXTRACTION PHACO AND INTRAOCULAR LENS PLACEMENT (IOC);  Surgeon: CWilliams Che MD;  Location: [facility];  Service: Ophthalmology;  Laterality: Left;  CDE: 38.59  ? COLONOSCOPY N/A 09/10/2016  ? Procedure: COLONOSCOPY;  Surgeon: NRogene Houston MD;  Location[facility];  Service: Endoscopy;  Laterality: N/A;  1200  ? COLONOSCOPY W/ POLYPECTOMY  2011  ? [facility]-Dr. Rehman  ? cortisone injection Left 01/13/2016  ? left shoulder  ? cortisone injection Left 09/21/2017  ? left shoulder  ? CYSTOSCOPY  01/20/2018  ? Procedure: CYSTOSCOPY FLEXIBLE;  Surgeon: Robert Gustin MD;  [facility]  Service: Urology;;  no seeds found in bladder  ? EYE SURGERY    ? laser surgery to repair retina tare  ? head reconstruction  1966  ? due to head injury in war  ? LIPOMA EXCISION Left 02/01/2019  ? Procedure: EXCISION LIPOMA LEFT UPPER EXTREMITY;  Surgeon: Robert Signs MD;  Location: [facility];  Service: General;  Laterality: Left;  ? PROSTATE BIOPSY  05/20/2016  ? RADIOACTIVE SEED IMPLANT N/A 01/20/2018  ? Procedure: RADIOACTIVE SEED IMPLANT/BRACHYTHERAPY IMPLANT;  Surgeon: Robert Gustin MD;  [facility]  Service: Urology;  Laterality: N/A;     88    seeds implanted  ?  REFRACTIVE SURGERY Left 03/05/2017  ? to resolve cloudiness  ? SPACE OAR INSTILLATION N/A 01/20/2018  ? Procedure: SPACE OAR INSTILLATION;  Surgeon: Robert Gustin, MD;  [facility];  Service: Urology;  Laterality: N/A;  ? TRIGGER FINGER RELEASE    ? ? ?Home Medications:  ?Allergies as of 08/20/2021   ? ?   Reactions  ? Rosuvastatin Other (See Comments)  ? Penicillin G Rash  ? Penicillins Rash, Other (See Comments)  ? Childhood allergy ?Has patient had a PCN reaction causing immediate rash, facial/tongue/throat swelling, SOB or lightheadedness with hypotension: yes ?Has patient had a PCN reaction causing severe  rash involving mucus membranes or skin necrosis: no ?Has patient had a PCN reaction that required hospitalization no ?Has patient had a PCN reaction occurring within the last 10 years: no ?If all of the above answers are "NO", then may proceed with Cephalosporin use.  ? ?  ? ?  ?Medication List  ?  ? ?  ? Accurate as of August 20, 2021 10:33 AM. If you have any questions, ask your nurse or doctor.  ?  ?  ? ?  ? ?aspirin 81 MG tablet ?Take 1 tablet (81 mg total) by mouth daily. ?  ?gabapentin 100 MG capsule ?Commonly known as: NEURONTIN ?SMARTSIG:1 Capsule(s) By Mouth 1 to 3 Times Daily ?  ?gabapentin 300 MG capsule ?Commonly known as: NEURONTIN ?Take 1 capsule by mouth 2 (two) times daily. ?  ?Gemtesa 75 MG Tabs ?Generic drug: Vibegron ?Take 1 capsule by mouth daily. ?  ?losartan-hydrochlorothiazide 50-12.5 MG tablet ?Commonly known as: HYZAAR ?Take 1 tablet by mouth daily. ?  ?losartan-hydrochlorothiazide 100-12.5 MG tablet ?Commonly known as: HYZAAR ?Take 1 tablet by mouth every morning. ?  ?naproxen 500 MG tablet ?Commonly known as: NAPROSYN ?Take 250 mg by mouth daily. ?  ?ONE TOUCH ULTRA TEST test strip ?Generic drug: glucose blood ?  ?onetouch ultrasoft lancets ?  ?potassium chloride SA 20 MEQ tablet ?Commonly known as: KLOR-CON M ?Take 20 mEq by mouth daily as needed (cramping). ?  ?predniSONE 10 MG (48) Tbpk tablet ?Commonly known as: STERAPRED UNI-PAK 48 TAB ?Take by mouth as directed. ?  ?tadalafil 5 MG tablet ?Commonly known as: CIALIS ?Take 1 tablet (5 mg total) by mouth daily as needed for erectile dysfunction. ?  ?tadalafil 20 MG tablet ?Commonly known as: CIALIS ?Take 1 tablet (20 mg total) by mouth daily as needed. ?  ?tamsulosin 0.4 MG Caps capsule ?Commonly known as: FLOMAX ?Take 1 capsule (0.4 mg total) by mouth daily after supper. ?  ?terbinafine 250 MG tablet ?Commonly known as: LAMISIL ?Take 250 mg by mouth daily. ?  ?VITAMIN B-3 PO ?Take by mouth. ?  ?Vitamin D3 125 MCG (5000 UT) Caps ?Take  5,000 Units by mouth daily. ?  ? ?  ? ? ?Allergies:  ?Allergies  ?Allergen Reactions  ? Rosuvastatin Other (See Comments)  ? Penicillin G Rash  ? Penicillins Rash and Other (See Comments)  ?  Childhood allergy ?Has patient had a PCN reaction causing immediate rash, facial/tongue/throat swelling, SOB or lightheadedness with hypotension: yes ?Has patient had a PCN reaction causing severe rash involving mucus membranes or skin necrosis: no ?Has patient had a PCN reaction that required hospitalization no ?Has patient had a PCN reaction occurring within the last 10 years: no ?If all of the above answers are "NO", then may proceed with Cephalosporin use. ? ?  ? ? ?Family History: ?Family History  ?Problem Relation  Age of Onset  ? Diabetes Mother   ? Hypertension Father   ? Heart disease Father   ?     before age 35  ? Heart attack Father   ? Pancreatic cancer Brother   ? Anesthesia problems Neg Hx   ? Hypotension Neg Hx   ? Malignant hyperthermia Neg Hx   ? Pseudochol deficiency Neg Hx   ? ? ?Social History:  reports that he quit smoking about 45 years ago. His smoking use included cigarettes. He has a 13.00 pack-year smoking history. He has never used smokeless tobacco. He reports current alcohol use of about 4.0 - 6.0 standard drinks per week. He reports that he does not use drugs. ? ?ROS: ?All other review of systems were reviewed and are negative except what is noted above in HPI ? ?Physical Exam: ?BP 138/71   Pulse 90   ?Constitutional:  Alert and oriented, No acute distress. ?HEENT: [place] AT, moist mucus membranes.  Trachea midline, no masses. ?Cardiovascular: No clubbing, cyanosis, or edema. ?Respiratory: Normal respiratory effort, no increased work of breathing. ?GI: Abdomen is soft, nontender, nondistended, no abdominal masses ?GU: No CVA tenderness.  ?Lymph: No cervical or inguinal lymphadenopathy. ?Skin: No rashes, bruises or suspicious lesions. ?Neurologic: Grossly intact, no focal deficits, moving all 4  extremities. ?Psychiatric: Normal mood and affect. ? ?Laboratory Data: ?Lab Results  ?Component Value Date  ? WBC 6.0 01/13/2018  ? HGB 16.5 01/13/2018  ? HCT 48.5 01/13/2018  ? MCV 91.7 01/13/2018  ? PLT 171 08/2

## 2021-08-20 NOTE — Patient Instructions (Signed)
Prostate Cancer °The prostate is a small gland that helps make semen. It is located below a man's bladder, in front of the rectum. Prostate cancer is when abnormal cells grow in this gland. °What are the causes? °The cause of this condition is not known. °What increases the risk? °Being age 78 or older. °Having a family history of prostate cancer. °Having a family history of cancer of the breasts or ovaries. °Having genes that are passed from parent to child (inherited). °Having Lynch syndrome. °African American men and men of African descent are diagnosed with prostate cancer at higher rates than other men. °What are the signs or symptoms? °Problems peeing (urinating). This may include: °A stream that is weak, or pee that stops and starts. °Trouble starting or stopping your pee. °Trouble emptying all of your pee. °Needing to pee more often, especially at night. °Blood in your pee or semen. °Pain in the: °Lower back. °Lower belly (abdomen). °Hips. °Trouble getting an erection. °Weakness or numbness in the legs or feet. °How is this treated? °Treatment for this condition depends on: °How much the cancer has spread. °Your age. °The kind of treatment you want. °Your health. °Treatments include: °Being watched. This is called observation. You will be tested from time to time, but you will not get treated. Tests are to make sure that the cancer is not growing. °Surgery. This may be done to: °Take out (remove) the prostate. °Freeze and kill cancer cells. °Radiation. This uses a strong beam of energy to kill cancer cells. °Chemotherapy. This uses medicines that stop cancer cells from increasing. This kills cancer cells and healthy cells. °Targeted therapy. This kills cancer cells only. Healthy cells are not affected. °Hormone treatment. This stops the body from making hormones that help the cancer cells grow. °Follow these instructions at home: °Lifestyle °Do not smoke or use any products that contain nicotine or tobacco.  If you need help quitting, ask your doctor. °Eat a healthy diet. °Treatment may affect your ability to have sex. If you have a partner, touch, hold, hug, and caress your partner to have intimate moments. °Get plenty of sleep. °Ask your doctor for help to find a support group for men with prostate cancer. °General instructions °Take over-the-counter and prescription medicines only as told by your doctor. °If you have to go to the hospital, let your cancer doctor (oncologist) know. °Keep all follow-up visits. °Where to find more information °American Cancer Society: www.cancer.org °American Society of Clinical Oncology: www.cancer.net °National Cancer Institute: www.cancer.gov °Contact a doctor if: °You have new or more trouble peeing. °You have new or more blood in your pee. °You have new or more pain in your hips, back, or chest. °Get help right away if: °You have weakness in your legs. °You lose feeling in your legs. °You cannot control your pee or your poop (stool). °You have chills or a fever. °Summary °The prostate is a male gland that helps make semen. °Prostate cancer is when abnormal cells grow in this gland. °Treatment includes doing surgery, using medicines, using strong beams of energy, or watching without treatment. °Ask your doctor for help to find a support group for men with prostate cancer. °Contact a doctor if you have problems peeing or have any new pain that you did not have before. °This information is not intended to replace advice given to you by your health care provider. Make sure you discuss any questions you have with your health care provider. °Document Revised: 07/31/2020 Document Reviewed: 07/31/2020 °Elsevier   Patient Education © 2022 [employer]vier Inc. ° °

## 2021-08-21 ENCOUNTER — Ambulatory Visit: Payer: Medicare Other | Admitting: Physician Assistant

## 2021-08-21 ENCOUNTER — Ambulatory Visit (HOSPITAL_COMMUNITY)
Admission: RE | Admit: 2021-08-21 | Discharge: 2021-08-21 | Disposition: A | Discharge status: Home / Self Care | Payer: Medicare Other | Source: Ambulatory Visit | Attending: Physician Assistant | Admitting: Physician Assistant

## 2021-08-21 VITALS — BP 145/82 | HR 90 | Temp 98.6°F | Resp 20 | Ht 68.0 in | Wt 171.2 lb

## 2021-08-21 DIAGNOSIS — I6521 Occlusion and stenosis of right carotid artery: Secondary | ICD-10-CM | POA: Diagnosis not present

## 2021-08-21 NOTE — Progress Notes (Signed)
?Office Note  ? ? ? ?CC:  follow up ?Requesting Provider:  Sharilyn Sites, MD ? ?HPI: Robert Zhang is a 76 y.o. (29-Dec-1945) male who presents for surveillance of carotid artery stenosis.  He has history of a TIA with left-sided facial weakness and left leg weakness in the past unrelated to carotid stenosis.  He denies any diagnosis of CVA or TIA since last office visit 1 year ago.  He also denies any current strokelike symptoms including slurring speech, changes in vision, or one-sided weakness.  He is taking an aspirin daily.  He has a statin intolerance.  He denies tobacco use. ? ? ?Past Medical History:  ?Diagnosis Date  ? Aortic atherosclerosis ([place]) 10/12/2017  ? Noted on CT   ? Arthritis   ? bilateral legs /   Neck-Degenerative Disc Disease  ? Carotid artery occlusion   ? Right Carotid   ? Cervical spondylosis   ? Cervical stenosis of spine   ? Degenerative disc disease, cervical   ? C4-7  ? Diabetes mellitus without complication ([place])   ? diet and exercise controlled, no medications, hgb A1C 07/31/2017 (6.9)  ? Erectile dysfunction 07/31/2020  ? External hemorrhoids   ? Hepatic cyst 10/12/2017  ? Low density lession right hepatic lobe, noted on CT  ? Hepatitis   ? Hepatitis C negative 12/05/2014  ? History of colon polyps   ? Hypertension   ? Leg cramps   ? Prostate CA [facility])   ? Protrusion of intervertebral disc of lumbosacral region   ? L5-S1  ? PTSD (post-traumatic stress disorder)   ? PTSD (post-traumatic stress disorder)   ? Pulmonary nodule 11/26/2016  ? 93m subpleural right lower lobe, noted on CT ABD/PELVIS  ? Right shoulder tendinitis   ? Sleep apnea   ? Stop Bang score of 5  ? Stroke ([facility] 2016  ? 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  ? ? ?Past Surgical History:  ?Procedure Laterality Date  ? CATARACT EXTRACTION W/PHACO  07/20/2011  ? Procedure: CATARACT EXTRACTION PHACO AND INTRAOCULAR LENS PLACEMENT (IOC);  Surgeon: CWilliams Che MD;  Location: [facility];  Service: Ophthalmology;  Laterality:  Right;  CDE=25.73  ? CATARACT EXTRACTION W/PHACO  08/31/2011  ? Procedure: CATARACT EXTRACTION PHACO AND INTRAOCULAR LENS PLACEMENT (IOC);  Surgeon: CWilliams Che MD;  Location: [facility];  Service: Ophthalmology;  Laterality: Left;  CDE: 38.59  ? COLONOSCOPY N/A 09/10/2016  ? Procedure: COLONOSCOPY;  Surgeon: NRogene Houston MD;  [facility];  Service: Endoscopy;  Laterality: N/A;  1200  ? COLONOSCOPY W/ POLYPECTOMY  2011  ? [facility]-Dr. Rehman  ? cortisone injection Left 01/13/2016  ? left shoulder  ? cortisone injection Left 09/21/2017  ? left shoulder  ? CYSTOSCOPY  01/20/2018  ? Procedure: CYSTOSCOPY FLEXIBLE;  Surgeon: MCleon Gustin MD;  [facility]  Service: Urology;;  no seeds found in bladder  ? EYE SURGERY    ? laser surgery to repair retina tare  ? head reconstruction  1966  ? due to head injury in war  ? LIPOMA EXCISION Left 02/01/2019  ? Procedure: EXCISION LIPOMA LEFT UPPER EXTREMITY;  Surgeon: JAviva Signs MD;  Location: [facility];  Service: General;  Laterality: Left;  ? PROSTATE BIOPSY  05/20/2016  ? RADIOACTIVE SEED IMPLANT N/A 01/20/2018  ? Procedure: RADIOACTIVE SEED IMPLANT/BRACHYTHERAPY IMPLANT;  Surgeon: MCleon Gustin MD;  [facility]  Service: Urology;  Laterality: N/A;  88    seeds implanted  ? REFRACTIVE SURGERY Left 03/05/2017  ? to resolve cloudiness  ? SPACE OAR INSTILLATION N/A 01/20/2018  ? Procedure: SPACE OAR INSTILLATION;  Surgeon: Cleon Gustin, MD;  Location: [facility];  Service: Urology;  Laterality: N/A;  ? TRIGGER FINGER RELEASE    ? ? ?Social History  ? ?Socioeconomic History  ? Marital status: Married  ?  Spouse name: Not on file  ? Number of children: Not on file  ? Years of education: Not on file  ? Highest education level: Not on file  ?Occupational History  ? Occupation: retired  ?Tobacco Use  ? Smoking status: Former  ?  Packs/day: 1.00  ?  Years: 13.00  ?  Pack  years: 13.00  ?  Types: Cigarettes  ?  Quit date: 04/17/1976  ?  Years since quitting: 45.3  ?  Passive exposure: Never  ? Smokeless tobacco: Never  ?Vaping Use  ? Vaping Use: Never used  ?Substance and Sexual Activity  ? Alcohol use: Yes  ?  Alcohol/week: 4.0 - 6.0 standard drinks  ?  Types: 4 - 6 Shots of liquor per week  ?  Comment: 2-3 mixed drinks on a saturday night  ? Drug use: Never  ? Sexual activity: Not Currently  ?Other Topics Concern  ? Not on file  ?Social History Narrative  ? Not on file  ? ?Social Determinants of Health  ? ?Financial Resource Strain: Not on file  ?Food Insecurity: Not on file  ?Transportation Needs: Not on file  ?Physical Activity: Not on file  ?Stress: Not on file  ?Social Connections: Not on file  ?Intimate Partner Violence: Not on file  ? ? ?Family History  ?Problem Relation Age of Onset  ? Diabetes Mother   ? Hypertension Father   ? Heart disease Father   ?     before age 62  ? Heart attack Father   ? Pancreatic cancer Brother   ? Anesthesia problems Neg Hx   ? Hypotension Neg Hx   ? Malignant hyperthermia Neg Hx   ? Pseudochol deficiency Neg Hx   ? ? ?Current Outpatient Medications  ?Medication Sig Dispense Refill  ? aspirin 81 MG tablet Take 1 tablet (81 mg total) by mouth daily. 30 tablet   ? gabapentin (NEURONTIN) 300 MG capsule Take 1 capsule by mouth 2 (two) times daily.    ? Lancets (ONETOUCH ULTRASOFT) lancets     ? losartan-hydrochlorothiazide (HYZAAR) 100-12.5 MG tablet Take 1 tablet by mouth every morning.    ? losartan-hydrochlorothiazide (HYZAAR) 50-12.5 MG tablet Take 1 tablet by mouth daily.    ? naproxen (NAPROSYN) 500 MG tablet Take 250 mg by mouth daily.    ? ONE TOUCH ULTRA TEST test strip     ? potassium chloride SA (K-DUR,KLOR-CON) 20 MEQ tablet Take 20 mEq by mouth daily as needed (cramping).     ? tamsulosin (FLOMAX) 0.4 MG CAPS capsule Take 1 capsule (0.4 mg total) by mouth daily after supper. 90 capsule 3  ? traMADol (ULTRAM) 50 MG tablet SMARTSIG:1  Tablet(s) By Mouth 1-2 Times Daily    ? ?No current facility-administered medications for this visit.  ? ? ?Allergies  ?Allergen Reactions  ? Mirabegron   ? Rosuvastatin Other (See Comments)  ? Tadalafil   ? Penicillin G Rash  ? Penicillins Rash and Other (See Comments)  ?  Childhood allergy ?Has patient had a PCN reaction causing immediate rash, facial/tongue/throat swelling, SOB  or lightheadedness with hypotension: yes ?Has patient had a PCN reaction causing severe rash involving mucus membranes or skin necrosis: no ?Has patient had a PCN reaction that required hospitalization no ?Has patient had a PCN reaction occurring within the last 10 years: no ?If all of the above answers are "NO", then may proceed with Cephalosporin use. ? ?  ? ? ? ?REVIEW OF SYSTEMS:  ? ?'[X]'$  denotes positive finding, '[ ]'$  denotes negative finding ?Cardiac  Comments:  ?Chest pain or chest pressure:    ?Shortness of breath upon exertion:    ?Short of breath when lying flat:    ?Irregular heart rhythm:    ?    ?Vascular    ?Pain in calf, thigh, or hip brought on by ambulation:    ?Pain in feet at night that wakes you up from your sleep:     ?Blood clot in your veins:    ?Leg swelling:     ?    ?Pulmonary    ?Oxygen at home:    ?Productive cough:     ?Wheezing:     ?    ?Neurologic    ?Sudden weakness in arms or legs:     ?Sudden numbness in arms or legs:     ?Sudden onset of difficulty speaking or slurred speech:    ?Temporary loss of vision in one eye:     ?Problems with dizziness:     ?    ?Gastrointestinal    ?Blood in stool:     ?Vomited blood:     ?    ?Genitourinary    ?Burning when urinating:     ?Blood in urine:    ?    ?Psychiatric    ?Major depression:     ?    ?Hematologic    ?Bleeding problems:    ?Problems with blood clotting too easily:    ?    ?Skin    ?Rashes or ulcers:    ?    ?Constitutional    ?Fever or chills:    ? ? ?PHYSICAL EXAMINATION: ? ?Vitals:  ? 08/21/21 1036 08/21/21 1038  ?BP: 136/80 (!) 145/82  ?Pulse: 90    ?Resp: 20   ?Temp: 98.6 ?F (37 ?C)   ?TempSrc: Temporal   ?SpO2: 97%   ?Weight: 171 lb 3.2 oz (77.7 kg)   ?Height: '5\' 8"'$  (1.727 m)   ? ? ?General:  WDWN in NAD; vital signs documented above ?Gait: Not observed ?HENT

## 2021-08-25 ENCOUNTER — Other Ambulatory Visit: Payer: Self-pay | Admitting: Family Medicine

## 2021-08-25 ENCOUNTER — Other Ambulatory Visit (HOSPITAL_COMMUNITY): Payer: Self-pay | Admitting: Family Medicine

## 2021-08-25 DIAGNOSIS — M5136 Other intervertebral disc degeneration, lumbar region: Secondary | ICD-10-CM | POA: Diagnosis not present

## 2021-08-25 DIAGNOSIS — S335XXA Sprain of ligaments of lumbar spine, initial encounter: Secondary | ICD-10-CM | POA: Diagnosis not present

## 2021-08-25 DIAGNOSIS — E1140 Type 2 diabetes mellitus with diabetic neuropathy, unspecified: Secondary | ICD-10-CM | POA: Diagnosis not present

## 2021-08-25 DIAGNOSIS — M545 Low back pain, unspecified: Secondary | ICD-10-CM

## 2021-09-01 ENCOUNTER — Other Ambulatory Visit: Payer: Self-pay | Admitting: *Deleted

## 2021-09-01 DIAGNOSIS — I6521 Occlusion and stenosis of right carotid artery: Secondary | ICD-10-CM

## 2021-09-02 ENCOUNTER — Encounter (INDEPENDENT_AMBULATORY_CARE_PROVIDER_SITE_OTHER): Payer: Self-pay | Admitting: *Deleted

## 2021-09-09 ENCOUNTER — Ambulatory Visit (HOSPITAL_COMMUNITY)
Admission: RE | Admit: 2021-09-09 | Discharge: 2021-09-09 | Disposition: A | Discharge status: Home / Self Care | Payer: Medicare Other | Source: Ambulatory Visit | Attending: Family Medicine | Admitting: Family Medicine

## 2021-09-09 DIAGNOSIS — M545 Low back pain, unspecified: Secondary | ICD-10-CM | POA: Diagnosis not present

## 2021-09-09 DIAGNOSIS — M5136 Other intervertebral disc degeneration, lumbar region: Secondary | ICD-10-CM | POA: Insufficient documentation

## 2021-09-09 DIAGNOSIS — S3992XA Unspecified injury of lower back, initial encounter: Secondary | ICD-10-CM | POA: Diagnosis not present

## 2021-09-16 DIAGNOSIS — M5136 Other intervertebral disc degeneration, lumbar region: Secondary | ICD-10-CM | POA: Diagnosis not present

## 2021-09-22 ENCOUNTER — Telehealth (INDEPENDENT_AMBULATORY_CARE_PROVIDER_SITE_OTHER): Payer: Self-pay | Admitting: *Deleted

## 2021-09-22 ENCOUNTER — Encounter (INDEPENDENT_AMBULATORY_CARE_PROVIDER_SITE_OTHER): Payer: Self-pay | Admitting: *Deleted

## 2021-09-22 ENCOUNTER — Other Ambulatory Visit (INDEPENDENT_AMBULATORY_CARE_PROVIDER_SITE_OTHER): Payer: Self-pay

## 2021-09-22 DIAGNOSIS — Z86010 Personal history of colonic polyps: Secondary | ICD-10-CM

## 2021-09-22 MED ORDER — PEG 3350-KCL-NA BICARB-NACL 420 G PO SOLR: 4000.0000 mL | Freq: Once | ORAL | 0 refills | Status: AC

## 2021-09-22 NOTE — Telephone Encounter (Signed)
Patient needs trilyte 

## 2021-09-22 NOTE — Telephone Encounter (Signed)
Referring MD/PCP: [place] ? ?Procedure: tcs ? ?Reason/Indication:  hx polyps ? ?Has patient had this procedure before?  Yes, 08/2016 ? If so, when, by whom and where?   ? ?Is there a family history of colon cancer?  no ? Who?  What age when diagnosed?   ? ?Is patient diabetic? 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     ?Does patient have prosthetic heart valve or mechanical valve?  no ? ?Do you have a pacemaker/defibrillator?  no ? ?Has patient ever had endocarditis/atrial fibrillation? no ? ?Does patient use oxygen? no ? ?Has patient had joint replacement within last 12 months?  no ? ?Is patient constipated or do they take laxatives? no ? ?Does patient have a history of alcohol/drug use?  yes ? ?Have you had a stroke/heart attack last 6 mths? no ? ?Do you take medicine for weight loss?  no ? ?For male patients,: have you had a hysterectomy  ?                     are you post menopausal  ?                     do you still have your menstrual cycle  ? ?Is patient on blood thinner such as Coumadin, Plavix and/or Aspirin? yes ? ?Medications: asa 81 mg daily, naproxen 500 mg prn, gabapentin 300 mg tid, losartan/hctz 50/12.5 mg daily, tamsulosin bid, voltaren prn, ezetimibe 10 mg daily, potassium 20 mg daily ? ?Allergies: pcn, rosuvastatin, cialis  ? ?Medication Adjustment per Dr Rehman/Dr Jenetta Downer asa 2 days ? ?Procedure date & time: 10/15/21 ? ? ?

## 2021-10-07 NOTE — Patient Instructions (Signed)
   Your procedure is scheduled on: 10/15/2021  Report to [place] Entrance at   9:00  AM.  Call this number if you have problems the morning of surgery: [phone]   Remember:              Follow Directions on the letter you received from Your Physician's office regarding the Bowel Prep              No Smoking the day of Procedure :   Take these medicines the morning of surgery with A SIP OF WATER: Flomax, Gabapentin, and Exetimibe   Do not wear jewelry, make-up or nail polish.    Do not bring valuables to the hospital.  Contacts, dentures or bridgework may not be worn into surgery.  .   Patients discharged the day of surgery will not be allowed to drive home.     Colonoscopy, Adult, Care After This sheet gives you information about how to care for yourself after your procedure. Your health care provider may also give you more specific instructions. If you have problems or questions, contact your health care provider. What can I expect after the procedure? After the procedure, it is common to have: A small amount of blood in your stool for 24 hours after the procedure. Some gas. Mild abdominal cramping or bloating.  Follow these instructions at home: General instructions  For the first 24 hours after the procedure: Do not drive or use machinery. Do not sign important documents. Do not drink alcohol. Do your regular daily activities at a slower pace than normal. Eat soft, easy-to-digest foods. Rest often. Take over-the-counter or prescription medicines only as told by your health care provider. It is up to you to get the results of your procedure. Ask your health care provider, or the department performing the procedure, when your results will be ready. Relieving cramping and bloating Try walking around when you have cramps or feel bloated. Apply heat to your abdomen as told by your health care provider. Use a heat source that your health care provider recommends,  such as a moist heat pack or a heating pad. Place a towel between your skin and the heat source. Leave the heat on for 20-30 minutes. Remove the heat if your skin turns bright red. This is especially important if you are unable to feel pain, heat, or cold. You may have a greater risk of getting burned. Eating and drinking Drink enough fluid to keep your urine clear or pale yellow. Resume your normal diet as instructed by your health care provider. Avoid heavy or fried foods that are hard to digest. Avoid drinking alcohol for as long as instructed by your health care provider. Contact a health care provider if: You have blood in your stool 2-3 days after the procedure. Get help right away if: You have more than a small spotting of blood in your stool. You pass large blood clots in your stool. Your abdomen is swollen. You have nausea or vomiting. You have a fever. You have increasing abdominal pain that is not relieved with medicine. This information is not intended to replace advice given to you by your health care provider. Make sure you discuss any questions you have with your health care provider. Document Released: 12/17/2003 Document Revised: 01/27/2016 Document Reviewed: 07/16/2015 Elsevier Interactive Patient Education  [employer].

## 2021-10-09 ENCOUNTER — Encounter (HOSPITAL_COMMUNITY)
Admission: RE | Admit: 2021-10-09 | Discharge: 2021-10-09 | Disposition: A | Discharge status: Home / Self Care | Payer: Medicare Other | Source: Ambulatory Visit | Attending: Internal Medicine | Admitting: Internal Medicine

## 2021-10-09 ENCOUNTER — Encounter (HOSPITAL_COMMUNITY): Payer: Self-pay

## 2021-10-09 VITALS — BP 152/87 | HR 116 | Temp 98.4°F | Resp 18 | Ht 68.0 in | Wt 166.0 lb

## 2021-10-09 DIAGNOSIS — Z86010 Personal history of colonic polyps: Secondary | ICD-10-CM | POA: Diagnosis not present

## 2021-10-09 DIAGNOSIS — K759 Inflammatory liver disease, unspecified: Secondary | ICD-10-CM | POA: Diagnosis not present

## 2021-10-09 DIAGNOSIS — Z01818 Encounter for other preprocedural examination: Secondary | ICD-10-CM | POA: Diagnosis not present

## 2021-10-09 DIAGNOSIS — I10 Essential (primary) hypertension: Secondary | ICD-10-CM | POA: Insufficient documentation

## 2021-10-09 LAB — COMPREHENSIVE METABOLIC PANEL
ALT: 22 U/L (ref 0–44)
AST: 24 U/L (ref 15–41)
Albumin: 4.2 g/dL (ref 3.5–5.0)
Alkaline Phosphatase: 76 U/L (ref 38–126)
Anion gap: 6 (ref 5–15)
BUN: 30 mg/dL — ABNORMAL HIGH (ref 8–23)
CO2: 28 mmol/L (ref 22–32)
Calcium: 8.9 mg/dL (ref 8.9–10.3)
Chloride: 104 mmol/L (ref 98–111)
Creatinine, Ser: 1.61 mg/dL — ABNORMAL HIGH (ref 0.61–1.24)
GFR, Estimated: 44 mL/min — ABNORMAL LOW (ref >60)
Glucose, Bld: 124 mg/dL — ABNORMAL HIGH (ref 70–99)
Potassium: 3.6 mmol/L (ref 3.5–5.1)
Sodium: 138 mmol/L (ref 135–145)
Total Bilirubin: 0.1 mg/dL — ABNORMAL LOW (ref 0.3–1.2)
Total Protein: 7 g/dL (ref 6.5–8.1)

## 2021-10-15 ENCOUNTER — Ambulatory Visit (HOSPITAL_COMMUNITY)
Admission: RE | Admit: 2021-10-15 | Discharge: 2021-10-15 | Disposition: A | Discharge status: Home / Self Care | Payer: Medicare Other | Attending: Internal Medicine | Admitting: Internal Medicine

## 2021-10-15 ENCOUNTER — Encounter (HOSPITAL_COMMUNITY)
Admission: RE | Disposition: A | Discharge status: Home / Self Care | Payer: Self-pay | Source: Home / Self Care | Attending: Internal Medicine

## 2021-10-15 ENCOUNTER — Ambulatory Visit (HOSPITAL_BASED_OUTPATIENT_CLINIC_OR_DEPARTMENT_OTHER): Payer: Medicare Other | Admitting: Certified Registered"

## 2021-10-15 ENCOUNTER — Encounter (HOSPITAL_COMMUNITY): Payer: Self-pay | Admitting: Internal Medicine

## 2021-10-15 ENCOUNTER — Ambulatory Visit (HOSPITAL_COMMUNITY): Payer: Medicare Other | Admitting: Certified Registered"

## 2021-10-15 DIAGNOSIS — K219 Gastro-esophageal reflux disease without esophagitis: Secondary | ICD-10-CM | POA: Insufficient documentation

## 2021-10-15 DIAGNOSIS — Z86010 Personal history of colonic polyps: Secondary | ICD-10-CM | POA: Diagnosis not present

## 2021-10-15 DIAGNOSIS — K644 Residual hemorrhoidal skin tags: Secondary | ICD-10-CM | POA: Insufficient documentation

## 2021-10-15 DIAGNOSIS — E119 Type 2 diabetes mellitus without complications: Secondary | ICD-10-CM | POA: Diagnosis not present

## 2021-10-15 DIAGNOSIS — K6289 Other specified diseases of anus and rectum: Secondary | ICD-10-CM | POA: Diagnosis not present

## 2021-10-15 DIAGNOSIS — K5730 Diverticulosis of large intestine without perforation or abscess without bleeding: Secondary | ICD-10-CM | POA: Diagnosis not present

## 2021-10-15 DIAGNOSIS — Z79899 Other long term (current) drug therapy: Secondary | ICD-10-CM | POA: Insufficient documentation

## 2021-10-15 DIAGNOSIS — Z09 Encounter for follow-up examination after completed treatment for conditions other than malignant neoplasm: Secondary | ICD-10-CM | POA: Diagnosis not present

## 2021-10-15 DIAGNOSIS — Z1211 Encounter for screening for malignant neoplasm of colon: Secondary | ICD-10-CM | POA: Insufficient documentation

## 2021-10-15 DIAGNOSIS — I10 Essential (primary) hypertension: Secondary | ICD-10-CM | POA: Insufficient documentation

## 2021-10-15 DIAGNOSIS — Z87891 Personal history of nicotine dependence: Secondary | ICD-10-CM | POA: Diagnosis not present

## 2021-10-15 DIAGNOSIS — G4730 Sleep apnea, unspecified: Secondary | ICD-10-CM | POA: Insufficient documentation

## 2021-10-15 HISTORY — PX: COLONOSCOPY WITH PROPOFOL: SHX5780

## 2021-10-15 LAB — HM COLONOSCOPY

## 2021-10-15 LAB — GLUCOSE, CAPILLARY: Glucose-Capillary: 129 mg/dL — ABNORMAL HIGH (ref 70–99)

## 2021-10-15 SURGERY — COLONOSCOPY WITH PROPOFOL
Anesthesia: General

## 2021-10-15 MED ORDER — SODIUM CHLORIDE 0.9 % IV SOLN: INTRAVENOUS | Status: DC

## 2021-10-15 MED ORDER — PROPOFOL 10 MG/ML IV BOLUS
INTRAVENOUS | Status: DC | PRN
  Administered 2021-10-15: 100 mg via INTRAVENOUS

## 2021-10-15 MED ORDER — PROPOFOL 500 MG/50ML IV EMUL
INTRAVENOUS | Status: DC | PRN
  Administered 2021-10-15: 150 ug/kg/min via INTRAVENOUS

## 2021-10-15 MED ORDER — LACTATED RINGERS IV SOLN: INTRAVENOUS | Status: DC

## 2021-10-15 MED ORDER — STERILE WATER FOR IRRIGATION IR SOLN
Status: DC | PRN
  Administered 2021-10-15: 60 mL

## 2021-10-15 MED ORDER — LACTATED RINGERS IV SOLN
INTRAVENOUS | Status: DC | PRN
Start: 2021-10-15 — End: 2021-10-15

## 2021-10-15 MED ORDER — LIDOCAINE HCL (CARDIAC) PF 100 MG/5ML IV SOSY
PREFILLED_SYRINGE | INTRAVENOUS | Status: DC | PRN
  Administered 2021-10-15: 50 mg via INTRAVENOUS

## 2021-10-15 NOTE — Anesthesia Postprocedure Evaluation (Signed)
Anesthesia Post Note  Patient: Robert Zhang  Procedure(s) Performed: COLONOSCOPY WITH PROPOFOL  Patient location during evaluation: Phase II Anesthesia Type: General Level of consciousness: awake Pain management: pain level controlled Vital Signs Assessment: post-procedure vital signs reviewed and stable Respiratory status: spontaneous breathing and respiratory function stable Cardiovascular status: blood pressure returned to baseline and stable Postop Assessment: no headache and no apparent nausea or vomiting Anesthetic complications: no Comments: Late entry   No notable events documented.   Last Vitals:  Vitals:   10/15/21 0915 10/15/21 1050  BP: (!) 149/76 124/80  Pulse: (!) 101 (!) 109  Resp: 17 (!) 22  Temp:  36.6 C  SpO2: 95% 99%    Last Pain:  Vitals:   10/15/21 1050  TempSrc: Oral  PainSc: 0-No pain                 Louann Sjogren

## 2021-10-15 NOTE — Op Note (Signed)
[facility] Patient Name: Robert Zhang Procedure Date: 10/15/2021 10:10 AM MRN: [id number] Date of Birth: 1945-07-11 Attending MD: Hildred Laser , MD CSN: [id number] Age: 76 Admit Type: Outpatient Procedure:                Colonoscopy Indications:              High risk colon cancer surveillance: Personal                            history of colonic polyps Providers:                Hildred Laser, MD, Lurline Del, RN, Casimer Bilis, Technician Referring MD:              Medicines:                Propofol per Anesthesia Complications:            No immediate complications. Estimated Blood Loss:     Estimated blood loss: none. Procedure:                Pre-Anesthesia Assessment:                           - Prior to the procedure, a History and Physical                            was performed, and patient medications and                            allergies were reviewed. The patient's tolerance of                            previous anesthesia was also reviewed. The risks                            and benefits of the procedure and the sedation                            options and risks were discussed with the patient.                            All questions were answered, and informed consent                            was obtained. Prior Anticoagulants: The patient has                            taken no previous anticoagulant or antiplatelet                            agents except for aspirin. ASA Grade Assessment:                            III -  A patient with severe systemic disease. After                            reviewing the risks and benefits, the patient was                            deemed in satisfactory condition to undergo the                            procedure.                           After obtaining informed consent, the colonoscope                            was passed under direct vision. Throughout the                             procedure, the patient's blood pressure, pulse, and                            oxygen saturations were monitored continuously. The                            [id number]-HQ190L ([id number]) scope was introduced through                            the anus and advanced to the the cecum, identified                            by appendiceal orifice and ileocecal valve. The                            colonoscopy was performed without difficulty. The                            patient tolerated the procedure well. The quality                            of the bowel preparation was good. The ileocecal                            valve, appendiceal orifice, and rectum were                            photographed. Scope In: 10:34:23 AM Scope Out: 10:47:38 AM Scope Withdrawal Time: 0 hours 10 minutes 43 seconds  Total Procedure Duration: 0 hours 13 minutes 15 seconds  Findings:      The perianal and digital rectal examinations were normal.      A few diverticula were found in the sigmoid colon.      The exam was otherwise normal throughout the examined colon.      External hemorrhoids were found during retroflexion. The hemorrhoids       were small.  Anal papilla(e) were hypertrophied. Impression:               - Diverticulosis in the sigmoid colon.                           - External hemorrhoids.                           - Anal papilla(e) were hypertrophied.                           - No specimens collected. Moderate Sedation:      Per Anesthesia Care Recommendation:           - Patient has a contact number available for                            emergencies. The signs and symptoms of potential                            delayed complications were discussed with the                            patient. Return to normal activities tomorrow.                            Written discharge instructions were provided to the                            patient.                           - High  fiber diet today.                           - Continue present medications.                           - No repeat colonoscopy due to age and the absence                            of advanced adenomas. Procedure Code(s):        --- Professional ---                           [phone], Colonoscopy, flexible; diagnostic, including                            collection of specimen(s) by brushing or washing,                            when performed (separate procedure) Diagnosis Code(s):        --- Professional ---                           Z86.010, Personal history of colonic polyps  K64.4, Residual hemorrhoidal skin tags                           K62.89, Other specified diseases of anus and rectum                           K57.30, Diverticulosis of large intestine without                            perforation or abscess without bleeding CPT copyright 2019 American Medical Association. All rights reserved. The codes documented in this report are preliminary and upon coder review may  be revised to meet current compliance requirements. Hildred Laser, MD Hildred Laser, MD 10/15/2021 10:56:18 AM This report has been signed electronically. Number of Addenda: 0

## 2021-10-15 NOTE — Anesthesia Procedure Notes (Signed)
Date/Time: 10/15/2021 10:34 AM Performed by: Orlie Dakin, CRNA Pre-anesthesia Checklist: Patient identified, Emergency Drugs available, Suction available and Patient being monitored Patient Re-evaluated:Patient Re-evaluated prior to induction Oxygen Delivery Method: Nasal cannula Induction Type: IV induction Placement Confirmation: positive ETCO2

## 2021-10-15 NOTE — Discharge Instructions (Signed)
Resume usual medications including aspirin as before. High-fiber diet. No driving for 24 hours. No more colonoscopies unless you have symptoms pertaining to lower GI tract.

## 2021-10-15 NOTE — Transfer of Care (Signed)
Immediate Anesthesia Transfer of Care Note  Patient: Robert Zhang  Procedure(s) Performed: COLONOSCOPY WITH PROPOFOL  Patient Location: Short Stay  Anesthesia Type:General  Level of Consciousness: awake  Airway & Oxygen Therapy: Patient Spontanous Breathing  Post-op Assessment: Report given to RN, Post -op Vital signs reviewed and stable and Patient moving all extremities X 4  Post vital signs: Reviewed and stable  Last Vitals:  Vitals Value Taken Time  BP    Temp    Pulse    Resp    SpO2      Last Pain:  Vitals:   10/15/21 0908  PainSc: 0-No pain         Complications: No notable events documented.

## 2021-10-15 NOTE — H&P (Signed)
Robert Zhang is an 76 y.o. male.   Chief Complaint: Patient is here for colonoscopy. HPI: Patient is 76 year old Caucasian male who has history of colonic adenomas and is here for surveillance colonoscopy.  His last exam was in April 2018 with removal of 4 small polyps and they are all tubular adenomas.  He denies abdominal pain change in bowel habits or rectal bleeding.  He takes aspirin on as-needed basis.  He states he does not take naproxen anymore. Personal history significant for prostate cancer.  Family history significant for pancreatic cancer in brother who was in his early 76s and died shortly thereafter.  Family history is negative for colon cancer. Recent blood work pertinent for serum creatinine of 1.61.  Patient will have follow-up testing done at [facility] medical clinic in few weeks.  Past Medical History:  Diagnosis Date   Aortic atherosclerosis ([place]) 10/12/2017   Noted on CT    Arthritis    bilateral legs /   Neck-Degenerative Disc Disease   Carotid artery occlusion    Right Carotid    Cervical spondylosis    Cervical stenosis of spine    Degenerative disc disease, cervical    C4-7   Diabetes mellitus without complication (HCC)    diet and exercise controlled, no medications, hgb A1C 07/31/2017 (6.9)   Erectile dysfunction 07/31/2020   External hemorrhoids    Hepatic cyst 10/12/2017   Low density lession right hepatic lobe, noted on CT   Hepatitis    History of colon polyps    Hypertension    Leg cramps    Prostate CA ([place])    Protrusion of intervertebral disc of lumbosacral region    L5-S1   PTSD (post-traumatic stress disorder)    PTSD (post-traumatic stress disorder)    Pulmonary nodule 11/26/2016   27m subpleural right lower lobe, noted on CT ABD/PELVIS   Right shoulder tendinitis    Sleep apnea    Stop Bang score of 5   St[place]   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    Past Surgical History:  Procedure Laterality Date   CATARACT  EXTRACTION W/PHACO  07/20/2011   Procedure: CATARACT EXTRACTION PHACO AND INTRAOCULAR LENS PLACEMENT ([place];  Surgeon: CWilliams Che MD;  Location: [facility];  Service: Ophthalmology;  Laterality: Right;  CDE=25.73   CATARACT EXTRACTION W/PHACO  08/31/2011   Procedure: CATARACT EXTRACTION PHACO AND INTRAOCULAR LENS PLACEMENT (I[facility]);  Surgeon: CWilliams Che MD;  Location: [facility];  Service: Ophthalmology;  Laterality: Left;  CDE: 38.59   COLONOSCOPY N/A 09/10/2016   Procedure: COLONOSCOPY;  Surgeon: NRogene Houston MD;  Location: [facility];  Service: Endoscopy;  Laterality: N/A;  1200   COLONOSCOPY W/ POLYPECTOMY  2011   [facility]-Dr. Trease [place]   cortisone injection Left 01/13/2016   left shoulder   cortisone injection Left 09/21/2017   left shoulder   CYSTOSCOPY  01/20/2018   Procedure: CYSTOSCOPY FLEXIBLE;  Surgeon: MCleon Gustin MD;  [facility]  Service: Urology;;  no seeds found in bladder   EYE SURGERY     laser surgery to repair retina tare   head reconstruction  1966   due to head injury in war   LIPOMA EXCISION Left 02/01/2019   Procedure: EXCISION LIPOMA LEFT UPPER EXTREMITY;  Surgeon: JAviva Signs MD;  Location: [facility];  Service: General;  Laterality: Left;   PROSTATE BIOPSY  05/20/2016   RADIOACTIVE SEED IMPLANT N/A 01/20/2018  Procedure: RADIOACTIVE SEED IMPLANT/BRACHYTHERAPY IMPLANT;  Surgeon: Cleon Gustin, MD;  Location: [facility];  Service: Urology;  Laterality: N/A;     88    seeds implanted   REFRACTIVE SURGERY Left 03/05/2017   to resolve cloudiness   SPACE OAR INSTILLATION N/A 01/20/2018   Procedure: SPACE OAR INSTILLATION;  Surgeon: Cleon Gustin, MD;  Location: [facility];  Service: Urology;  Laterality: N/A;   TRIGGER FINGER RELEASE      Family History  Problem Relation Age of Onset   Diabetes Mother    Hypertension Father    Heart disease Father        before age 61   Heart  attack Father    Pancreatic cancer Brother    Anesthesia problems Neg Hx    Hypotension Neg Hx    Malignant hyperthermia Neg Hx    Pseudochol deficiency Neg Hx    Social History:  reports that he quit smoking about 45 years ago. His smoking use included cigarettes. He has a 13.00 pack-year smoking history. He has never been exposed to tobacco smoke. He has never used smokeless tobacco. He reports current alcohol use of about 4.0 - 6.0 standard drinks per week. He reports that he does not use drugs.  Allergies:  Allergies  Allergen Reactions   Mirabegron     Pt states he doesn't have a reaction to this medication but can not take it because he takes hctz   Rosuvastatin Other (See Comments)   Tadalafil     Dropped blood pressure too low   Penicillin G Rash   Penicillins Rash and Other (See Comments)    Childhood allergy Has patient had a PCN reaction causing immediate rash, facial/tongue/throat swelling, SOB or lightheadedness with hypotension: yes Has patient had a PCN reaction causing severe rash involving mucus membranes or skin necrosis: no Has patient had a PCN reaction that required hospitalization no Has patient had a PCN reaction occurring within the last 10 years: no If all of the above answers are "NO", then may proceed with Cephalosporin use.      Medications Prior to Admission  Medication Sig Dispense Refill   aspirin 81 MG tablet Take 1 tablet (81 mg total) by mouth daily. 30 tablet    diclofenac Sodium (VOLTAREN) 1 % GEL Apply 2 g topically daily as needed (joint pain).     ezetimibe (ZETIA) 10 MG tablet Take 10 mg by mouth daily.     gabapentin (NEURONTIN) 300 MG capsule Take 300 mg by mouth 2 (two) times daily.     losartan-hydrochlorothiazide (HYZAAR) 100-25 MG tablet Take 1 tablet by mouth every morning.     naproxen (NAPROSYN) 500 MG tablet Take 500 mg by mouth daily as needed for moderate pain.     potassium chloride SA (K-DUR,KLOR-CON) 20 MEQ tablet Take 20 mEq  by mouth daily as needed (cramping).      tamsulosin (FLOMAX) 0.4 MG CAPS capsule Take 1 capsule (0.4 mg total) by mouth daily after supper. 90 capsule 3   Lancets (ONETOUCH ULTRASOFT) lancets      ONE TOUCH ULTRA TEST test strip      sulfamethoxazole-trimethoprim (BACTRIM DS) 800-160 MG tablet Take 1 tablet by mouth 2 (two) times daily.      Results for orders placed or performed during the hospital encounter of 10/15/21 (from the past 48 hour(s))  Glucose, capillary     Status: Abnormal   Collection Time: 10/15/21  9:05 AM  Result Value Ref Range   Glucose-Capillary 129 (H) 70 - 99 mg/dL    Comment: Glucose reference range applies only to samples taken after fasting for at least 8 hours.   No results found.  Review of Systems  Blood pressure (!) 149/76, pulse (!) 101, temperature 97.8 F (36.6 C), resp. rate 17, SpO2 95 %. Physical Exam HENT:     Mouth/Throat:     Mouth: Mucous membranes are moist.     Pharynx: Oropharynx is clear.  Eyes:     General: No scleral icterus.    Conjunctiva/sclera: Conjunctivae normal.  Cardiovascular:     Rate and Rhythm: Normal rate and regular rhythm.     Heart sounds: Normal heart sounds. No murmur heard. Pulmonary:     Effort: Pulmonary effort is normal.     Breath sounds: Normal breath sounds.  Abdominal:     General: There is no distension.     Palpations: Abdomen is soft. There is no mass.     Tenderness: There is no abdominal tenderness.  Musculoskeletal:        General: No swelling.     Cervical back: Neck supple.  Lymphadenopathy:     Cervical: No cervical adenopathy.  Neurological:     Mental Status: He is alert.     Assessment/Plan  History of colonic polyps/tubular adenomas Surveillance colonoscopy.  Hildred Laser, MD 10/15/2021, 10:26 AM

## 2021-10-15 NOTE — Anesthesia Preprocedure Evaluation (Signed)
Anesthesia Evaluation  Patient identified by MRN, date of birth, ID band Patient awake    Reviewed: Allergy & Precautions, H&P , NPO status , Patient's Chart, lab work & pertinent test results, reviewed documented beta blocker date and time   Airway Mallampati: II  TM Distance: >3 FB Neck ROM: full    Dental no notable dental hx.    Pulmonary sleep apnea , former smoker,    Pulmonary exam normal breath sounds clear to auscultation       Cardiovascular Exercise Tolerance: Good hypertension, negative cardio ROS   Rhythm:regular Rate:Normal     Neuro/Psych PSYCHIATRIC DISORDERS Anxiety negative neurological ROS     GI/Hepatic Neg liver ROS, GERD  Medicated,  Endo/Other  negative endocrine ROSdiabetes, Type 2  Renal/GU negative Renal ROS  negative genitourinary   Musculoskeletal   Abdominal   Peds  Hematology negative hematology ROS (+)   Anesthesia Other Findings   Reproductive/Obstetrics negative OB ROS                             Anesthesia Physical Anesthesia Plan  ASA: 3  Anesthesia Plan: General   Post-op Pain Management:    Induction:   PONV Risk Score and Plan: Propofol infusion  Airway Management Planned:   Additional Equipment:   Intra-op Plan:   Post-operative Plan:   Informed Consent: I have reviewed the patients History and Physical, chart, labs and discussed the procedure including the risks, benefits and alternatives for the proposed anesthesia with the patient or authorized representative who has indicated his/her understanding and acceptance.     Dental Advisory Given  Plan Discussed with: CRNA  Anesthesia Plan Comments:         Anesthesia Quick Evaluation

## 2021-10-16 ENCOUNTER — Encounter (INDEPENDENT_AMBULATORY_CARE_PROVIDER_SITE_OTHER): Payer: Self-pay | Admitting: *Deleted

## 2021-10-22 ENCOUNTER — Encounter (HOSPITAL_COMMUNITY): Payer: Self-pay | Admitting: Internal Medicine

## 2022-02-17 ENCOUNTER — Ambulatory Visit: Payer: Medicare Other | Admitting: Physician Assistant

## 2022-02-17 ENCOUNTER — Ambulatory Visit (HOSPITAL_COMMUNITY)
Admission: RE | Admit: 2022-02-17 | Discharge: 2022-02-17 | Disposition: A | Discharge status: Home / Self Care | Payer: Medicare Other | Source: Ambulatory Visit | Attending: Vascular Surgery | Admitting: Vascular Surgery

## 2022-02-17 VITALS — BP 189/90 | HR 70 | Temp 97.6°F | Resp 20 | Ht 68.0 in | Wt 172.7 lb

## 2022-02-17 DIAGNOSIS — I6521 Occlusion and stenosis of right carotid artery: Secondary | ICD-10-CM | POA: Insufficient documentation

## 2022-02-17 NOTE — Progress Notes (Signed)
  POST OPERATIVE OFFICE NOTE    CC:  F/u for surgery  HPI:  This is a 76 y.o. male who is here for carotid surveillance.  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  He denies new stroke symptoms including slurring speech, changes in vision, or one-sided weakness.  He is on an aspirin daily.  He has an intolerance to statins.  He denies tobacco use.  At last office visit the right internal carotid stenosis increase slightly to the category of 60 to 79%.    Allergies  Allergen Reactions   Mirabegron     Pt states he doesn't have a reaction to this medication but can not take it because he takes hctz   Rosuvastatin Other (See Comments)   Tadalafil     Dropped blood pressure too low   Penicillin G Rash   Penicillins Rash and Other (See Comments)    Childhood allergy Has patient had a PCN reaction causing immediate rash, facial/tongue/throat swelling, SOB or lightheadedness with hypotension: yes Has patient had a PCN reaction causing severe rash involving mucus membranes or skin necrosis: no Has patient had a PCN reaction that required hospitalization no Has patient had a PCN reaction occurring within the last 10 years: no If all of the above answers are "NO", then may proceed with Cephalosporin use.      Current Outpatient Medications  Medication Sig Dispense Refill   aspirin 81 MG tablet Take 1 tablet (81 mg total) by mouth daily. 30 tablet    diclofenac Sodium (VOLTAREN) 1 % GEL Apply 2 g topically daily as needed (joint pain).     gabapentin (NEURONTIN) 300 MG capsule Take 300 mg by mouth 2 (two) times daily.     Lancets (ONETOUCH ULTRASOFT) lancets      losartan-hydrochlorothiazide (HYZAAR) 100-25 MG tablet Take 1 tablet by mouth every morning.     ONE TOUCH ULTRA TEST test strip      potassium chloride SA (K-DUR,KLOR-CON) 20 MEQ tablet Take 20 mEq by mouth daily as needed (cramping).       sulfamethoxazole-trimethoprim (BACTRIM DS) 800-160 MG tablet Take 1 tablet by mouth 2 (two) times daily.     tamsulosin (FLOMAX) 0.4 MG CAPS capsule Take 1 capsule (0.4 mg total) by mouth daily after supper. 90 capsule 3   ezetimibe (ZETIA) 10 MG tablet Take 10 mg by mouth daily. (Patient not taking: Reported on 02/17/2022)     No current facility-administered medications for this visit.     ROS:  See HPI  Physical Exam:  General: Well-nourished no apparent distress Extremities: Moving all extremities well Neuro: Cranial nerves grossly intact Abdomen: Soft nontender nondistended    Assessment/Plan:  This is a 76 y.o. male here for surveillance of carotid artery stenosis  -Subjectively the patient has not had any neurological events since last office visit 6 months ago -Duplex shows right ICA 40 to 59% stenosis.  Previously it was in the category of 60 to 79%.  Patient seems to be at the 60% range given that he is flipping back and forth from moderate to severe categories.  At any rate this is an asymptomatic stenosis that remained stable.  No indication for revascularization at this time. -Recheck carotid duplex in 1 year -Continue aspirin daily   Dagoberto Ligas PA-C [facility] [phone]   Clinic MD:  Stanford Breed

## 2022-03-05 DIAGNOSIS — Z23 Encounter for immunization: Secondary | ICD-10-CM | POA: Diagnosis not present

## 2022-03-20 DIAGNOSIS — I10 Essential (primary) hypertension: Secondary | ICD-10-CM | POA: Diagnosis not present

## 2022-03-20 DIAGNOSIS — J449 Chronic obstructive pulmonary disease, unspecified: Secondary | ICD-10-CM | POA: Diagnosis not present

## 2022-03-20 DIAGNOSIS — E1140 Type 2 diabetes mellitus with diabetic neuropathy, unspecified: Secondary | ICD-10-CM | POA: Diagnosis not present

## 2022-03-20 DIAGNOSIS — M5136 Other intervertebral disc degeneration, lumbar region: Secondary | ICD-10-CM | POA: Diagnosis not present

## 2022-03-20 DIAGNOSIS — E119 Type 2 diabetes mellitus without complications: Secondary | ICD-10-CM | POA: Diagnosis not present

## 2022-03-20 DIAGNOSIS — Z0001 Encounter for general adult medical examination with abnormal findings: Secondary | ICD-10-CM | POA: Diagnosis not present

## 2022-03-20 DIAGNOSIS — M109 Gout, unspecified: Secondary | ICD-10-CM | POA: Diagnosis not present

## 2022-04-08 LAB — HM DIABETES EYE EXAM

## 2022-04-28 DIAGNOSIS — M48062 Spinal stenosis, lumbar region with neurogenic claudication: Secondary | ICD-10-CM | POA: Diagnosis not present

## 2022-04-28 DIAGNOSIS — M4316 Spondylolisthesis, lumbar region: Secondary | ICD-10-CM | POA: Diagnosis not present

## 2022-05-07 ENCOUNTER — Ambulatory Visit (HOSPITAL_COMMUNITY): Payer: Medicare Other | Attending: Physical Medicine and Rehabilitation | Admitting: Physical Therapy

## 2022-05-07 DIAGNOSIS — E1165 Type 2 diabetes mellitus with hyperglycemia: Secondary | ICD-10-CM | POA: Diagnosis not present

## 2022-05-07 DIAGNOSIS — M5459 Other low back pain: Secondary | ICD-10-CM | POA: Insufficient documentation

## 2022-05-07 DIAGNOSIS — I10 Essential (primary) hypertension: Secondary | ICD-10-CM | POA: Diagnosis not present

## 2022-05-07 DIAGNOSIS — M109 Gout, unspecified: Secondary | ICD-10-CM | POA: Diagnosis not present

## 2022-05-07 DIAGNOSIS — E0590 Thyrotoxicosis, unspecified without thyrotoxic crisis or storm: Secondary | ICD-10-CM | POA: Diagnosis not present

## 2022-05-07 NOTE — Therapy (Signed)
OUTPATIENT PHYSICAL THERAPY THORACOLUMBAR EVALUATION   Patient Name: Robert Zhang MRN: [id number] DOB:08-13-1945, 76 y.o., male Today's Date: 05/07/2022  END OF SESSION:  PT End of Session - 05/07/22 1117     Visit Number 1    Number of Visits 12    Date for PT Re-Evaluation 06/18/22    Authorization Type [employer] Medicare    Progress Note Due on Visit 10    PT Start Time 1030    PT Stop Time 1115    PT Time Calculation (min) 45 min    Activity Tolerance Patient tolerated treatment well    Behavior During Therapy WFL for tasks assessed/performed             Past Medical History:  Diagnosis Date   Aortic atherosclerosis ([place]) 10/12/2017   Noted on CT    Arthritis    bilateral legs /   Neck-Degenerative Disc Disease   Carotid artery occlusion    Right Carotid    Cervical spondylosis    Cervical stenosis of spine    Degenerative disc disease, cervical    C4-7   Diabetes mellitus without complication (HCC)    diet and exercise controlled, no medications, hgb A1C 07/31/2017 (6.9)   Erectile dysfunction 07/31/2020   External hemorrhoids    Hepatic cyst 10/12/2017   Low density lession right hepatic lobe, noted on CT   Hepatitis    History of colon polyps    Hypertension    Leg cramps    Prostate CA ([place])    Protrusion of intervertebral disc of lumbosacral region    L5-S1   PTSD (post-traumatic stress disorder)    PTSD (post-traumatic stress disorder)    Pulmonary nodule 11/26/2016   42m subpleural right lower lobe, noted on CT ABD/PELVIS   Right shoulder tendinitis    Sleep apnea    Stop Bang score of 5   [place]   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   Past Surgical History:  Procedure Laterality Date   CATARACT EXTRACTION W/PHACO  07/20/2011   Procedure: CATARACT EXTRACTION PHACO AND INTRAOCULAR LENS PLACEMENT ([place];  Surgeon: CWilliams Che MD;  Location: [facility];  Service: Ophthalmology;  Laterality: Right;  CDE=25.73   CATARACT  EXTRACTION W/PHACO  08/31/2011   Procedure: CATARACT EXTRACTION PHACO AND INTRAOCULAR LENS PLACEMENT (I[facility]);  Surgeon: CWilliams Che MD;  Location: [facility];  Service: Ophthalmology;  Laterality: Left;  CDE: 38.59   COLONOSCOPY N/A 09/10/2016   Procedure: COLONOSCOPY;  Surgeon: NRogene Houston MD;  Location[facility];  Service: Endoscopy;  Laterality: N/A;  1200   COLONOSCOPY W/ POLYPECTOMY  2011   [facility]-Dr. Rehman   COLONOSCOPY WITH PROPOFOL N/A 10/15/2021   Procedure: COLONOSCOPY WITH PROPOFOL;  Surgeon: RRogene Houston MD;  [facility]: [facility];  Service: Endoscopy;  Laterality: N/A;  1020   cortisone injection Left 01/13/2016   left shoulder   cortisone injection Left 09/21/2017   left shoulder   CYSTOSCOPY  01/20/2018   Procedure: CYSTOSCOPY FLEXIBLE;  Surgeon: MCleon Gustin MD;  [facility]  Service: Urology;;  no seeds found in bladder   EYE SURGERY     laser surgery to repair retina tare   head reconstruction  1966   due to head injury in war   LIPOMA EXCISION Left 02/01/2019   Procedure: EXCISION LIPOMA LEFT UPPER EXTREMITY;  Surgeon: JAviva Signs MD;  Location: [facility];  Service: General;  Laterality: Left;   PROSTATE BIOPSY  05/20/2016   RADIOACTIVE SEED IMPLANT N/A 01/20/2018   Procedure: RADIOACTIVE SEED IMPLANT/BRACHYTHERAPY IMPLANT;  Surgeon: Cleon Gustin, MD;  Location: [facility];  Service: Urology;  Laterality: N/A;     88    seeds implanted   REFRACTIVE SURGERY Left 03/05/2017   to resolve cloudiness   SPACE OAR INSTILLATION N/A 01/20/2018   Procedure: SPACE OAR INSTILLATION;  Surgeon: Cleon Gustin, MD;  Location: [facility];  Service: Urology;  Laterality: N/A;   TRIGGER FINGER RELEASE     Patient Active Problem List   Diagnosis Date Noted   Type 2 diabetes mellitus ([place]) 08/20/2020   Pure hypercholesterolemia 08/20/2020   Polyp of colon 08/20/2020   Other ill-defined  and unknown causes of morbidity and mortality 08/20/2020   Impaired fasting glucose 08/20/2020   Gout 08/20/2020   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 08/20/2020   Blood in urine 08/20/2020   Benign essential hypertension 08/20/2020   Hyperlipemia 08/20/2020   Hypertension 08/20/2020   Type 2 diabetes mellitus with hyperglycemia ([place]) 08/20/2020   Erectile dysfunction 07/31/2020   Benign prostatic hyperplasia with urinary obstruction 07/26/2019   Nocturia 07/26/2019   Lipoma of left upper extremity    Prostate cancer ([place]) 10/25/2017   Family history of cancer 10/25/2017   Bursitis/tendonitis, shoulder 09/21/2017   Hx of colonic polyps 07/03/2016   Esophageal reflux 05/22/2005    PCP: Sharilyn Sites MD  REFERRING PROVIDER: Newman Pies, MD  REFERRING DIAG: [phone] (ICD-10-CM) - Spinal stenosis, lumbar region with neurogenic claudication  Rationale for Evaluation and Treatment: Rehabilitation  THERAPY DIAG:  Other low back pain - Plan: PT plan of care cert/re-cert  ONSET DATE: 2-3 months ago   SUBJECTIVE:                                                                                                                                                                                           SUBJECTIVE STATEMENT: Patient presents to therapy with complaint of low back pain starting about 2-3 months ago. Patient states he thinks he may have flared it up when he picked up the front end of his riding lawn mower to get it out of a ditch a few moths ago. He has had xray and MRI with shows multi level generation and several disc herniations. He was given steroid inject a few weeks ago which was helpful for a short period. He takes aspirin and occasional naproxen to manage sx PRN.   PERTINENT HISTORY:  Arthritis, DM, Hx of cancer   PAIN:  Are you having pain?  Yes: NPRS scale: 5-6/10 Pain location: Lt low back, radiates to LT leg Pain description: aching,  sharp  Aggravating factors: standing, walking, bending , lifting  Relieving factors: rest, meds   PRECAUTIONS: None  WEIGHT BEARING RESTRICTIONS: No  FALLS:  Has patient fallen in last 6 months? No  LIVING ENVIRONMENT: Lives with: lives with their spouse Lives in: House/apartment Stairs: No Has following equipment at home: Single point cane, Environmental consultant - 2 wheeled, Health visitor, and Wheelchair (manual)  OCCUPATION: Retired Furniture conservator/restorer   PLOF: Independent  PATIENT GOALS: "Get good enough I don't have to get surgery"  NEXT MD VISIT:   OBJECTIVE:   DIAGNOSTIC FINDINGS:  IMPRESSION: 1. Multilevel degenerative changes as above have overall progressed since 2013, most notably as follows. 2. Disc bulge and moderate bilateral facet arthropathy at L4-L5 contributing to moderate to severe spinal canal stenosis with effacement of the subarticular zones and impingement of the traversing right L5 nerve root, and moderate right and mild left neural foraminal stenosis. 3. Disc bulge eccentric to the right and right worse than left facet arthropathy at L5-S1 resulting in narrowing of the right subarticular zone with possible irritation of the traversing right S1 nerve root, and severe right neural foraminal stenosis. 4. Extraforaminal disc material on the right at L1-L2 may contact and displace the exiting L1 nerve root. 5. Effacement of the left subarticular zone at L2-L3 with suspected impingement of the traversing L3 nerve root. There is also possible contact of the exiting left L2 nerve root by extraforaminal disc material.    PATIENT SURVEYS:  FOTO 52% function   COGNITION: Overall cognitive status: Within functional limits for tasks assessed     SENSATION: WFL  POSTURE:  slouched seated posture  PALPATION: Mod TTP about bilateral lumbar paraspinals, LT gluteal muscles   LUMBAR ROM:   AROM eval  Flexion 25% limited  Extension 100% limited  Right lateral flexion 50%  limited  Left lateral flexion 50% limited  Right rotation   Left rotation    (Blank rows = not tested)   LOWER EXTREMITY MMT:    MMT Right eval Left eval  Hip flexion 4+ 4+  Hip extension 3- 3- P!  Hip abduction 4 4+  Hip adduction    Hip internal rotation    Hip external rotation    Knee flexion    Knee extension 5 5  Ankle dorsiflexion 5 5  Ankle plantarflexion    Ankle inversion    Ankle eversion     (Blank rows = not tested)   FUNCTIONAL TESTS:  5 times sit to stand: 15.437 sec using UEs  SPECIAL TESTS: (+) slump on LT, (+) sacral compression, (+) SLR on LT   GAIT:  TODAY'S TREATMENT:                                                                                                                              DATE:  05/07/22 Eval  PATIENT EDUCATION:  Education details: on Eval findings, POC and HEP  Person educated: Patient Education method: Explanation Education comprehension: verbalized understanding  HOME EXERCISE PROGRAM: Access Code: [email or web address] URL: [email or web address] Date: 05/07/2022 Prepared by: Josue Hector  Exercises - Supine Bridge  - 2-3 x daily - 7 x weekly - 1-2 sets - 10 reps - 3 second hold - Supine Sciatic Nerve Glide  - 2-3 x daily - 7 x weekly - 2 sets - 10 reps - Supine Figure 4 Piriformis Stretch  - 2-3 x daily - 7 x weekly - 1 sets - 3 reps - 30 seconds hold  ASSESSMENT:  CLINICAL IMPRESSION: Patient is a 76 y.o. male who presents to physical therapy with complaint of LBP. Patient demonstrates muscle weakness, reduced ROM, and fascial restrictions which are likely contributing to symptoms of pain and are negatively impacting patient ability to perform ADLs and functional mobility tasks. Patient will benefit from skilled physical therapy services to address these deficits to reduce pain and improve level of function with ADLs and functional mobility tasks.   OBJECTIVE IMPAIRMENTS: Abnormal gait, decreased  activity tolerance, decreased mobility, difficulty walking, decreased ROM, decreased strength, hypomobility, increased fascial restrictions, impaired flexibility, improper body mechanics, and pain.   ACTIVITY LIMITATIONS: carrying, lifting, bending, sitting, standing, squatting, sleeping, stairs, transfers, bed mobility, and locomotion level  PARTICIPATION LIMITATIONS: meal prep, cleaning, laundry, driving, shopping, community activity, and yard work  PERSONAL FACTORS: Age and 1-2 comorbidities: See above  are also affecting patient's functional outcome.   REHAB POTENTIAL: Good  CLINICAL DECISION MAKING: Stable/uncomplicated  EVALUATION COMPLEXITY: Low   GOALS: SHORT TERM GOALS: Target date: 05/28/2022  Patient will be independent with initial HEP and self-management strategies to improve functional outcomes Baseline:  Goal status: INITIAL   LONG TERM GOALS: Target date: 06/18/2022  Patient will be independent with advanced HEP and self-management strategies to improve functional outcomes Baseline:  Goal status: INITIAL  2.  Patient will improve FOTO score to predicted value to indicate improvement in functional outcomes Baseline: 52% function  Goal status: INITIAL  3.  Patient will report reduction of back pain to 0/10 for improved quality of life and ability to perform ADLs  Baseline: 5-6/10 Goal status: INITIAL  4. Patient will have equal to or > 4+/5 MMT throughout BLE to improve ability to perform functional mobility, stair ambulation and ADLs.  Baseline: See MMT Goal status: INITIAL  PLAN:  PT FREQUENCY: 1-2x/week  PT DURATION: 6 weeks  PLANNED INTERVENTIONS: Therapeutic exercises, Therapeutic activity, Neuromuscular re-education, Balance training, Gait training, Patient/Family education, Joint manipulation, Joint mobilization, Stair training, Aquatic Therapy, Dry Needling, Electrical stimulation, Spinal manipulation, Spinal mobilization, Cryotherapy, Moist heat, scar  mobilization, Taping, Traction, Ultrasound, Biofeedback, Ionotophoresis '4mg'$ /ml Dexamethasone, and Manual therapy. Marland Kitchen  PLAN FOR NEXT SESSION: Progress hip and core strength as tolerated. Improve pain free lumbar AROM as able.   11:18 AM, 05/07/22 Josue Hector PT DPT  Physical Therapist with [facility]  [phone]

## 2022-05-13 ENCOUNTER — Encounter (HOSPITAL_COMMUNITY): Payer: Self-pay | Admitting: Physical Therapy

## 2022-05-13 ENCOUNTER — Ambulatory Visit (HOSPITAL_COMMUNITY): Payer: Medicare Other | Admitting: Physical Therapy

## 2022-05-13 DIAGNOSIS — M5459 Other low back pain: Secondary | ICD-10-CM | POA: Diagnosis not present

## 2022-05-13 NOTE — Therapy (Signed)
OUTPATIENT PHYSICAL THERAPY TREATMENT   Patient Name: Robert Zhang MRN: [id number] DOB:14-Jan-1946, 76 y.o., male Today's Date: 05/13/2022  END OF SESSION:  PT End of Session - 05/13/22 1024     Visit Number 2    Number of Visits 12    Date for PT Re-Evaluation 06/18/22    Authorization Type [employer] Medicare    Progress Note Due on Visit 10    PT Start Time 1027    PT Stop Time 1107    PT Time Calculation (min) 40 min    Activity Tolerance Patient tolerated treatment well    Behavior During Therapy WFL for tasks assessed/performed             Past Medical History:  Diagnosis Date   Aortic atherosclerosis ([place]) 10/12/2017   Noted on CT    Arthritis    bilateral legs /   Neck-Degenerative Disc Disease   Carotid artery occlusion    Right Carotid    Cervical spondylosis    Cervical stenosis of spine    Degenerative disc disease, cervical    C4-7   Diabetes mellitus without complication (HCC)    diet and exercise controlled, no medications, hgb A1C 07/31/2017 (6.9)   Erectile dysfunction 07/31/2020   External hemorrhoids    Hepatic cyst 10/12/2017   Low density lession right hepatic lobe, noted on CT   Hepatitis    History of colon polyps    Hypertension    Leg cramps    Prostate CA ([place])    Protrusion of intervertebral disc of lumbosacral region    L5-S1   PTSD (post-traumatic stress disorder)    PTSD (post-traumatic stress disorder)    Pulmonary nodule 11/26/2016   64m subpleural right lower lobe, noted on CT ABD/PELVIS   Right shoulder tendinitis    Sleep apnea    Stop Bang score of 5   Stroke ([place] 2016   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   Past Surgical History:  Procedure Laterality Date   CATARACT EXTRACTION W/PHACO  07/20/2011   Procedure: CATARACT EXTRACTION PHACO AND INTRAOCULAR LENS PLACEMENT ([place];  Surgeon: CWilliams Che MD;  Location: [facility];  Service: Ophthalmology;  Laterality: Right;  CDE=25.73   CATARACT EXTRACTION W/PHACO   08/31/2011   Procedure: CATARACT EXTRACTION PHACO AND INTRAOCULAR LENS PLACEMENT (IOC);  Surgeon: CWilliams Che MD;  Location: [facility];  Service: Ophthalmology;  Laterality: Left;  CDE: 38.59   COLONOSCOPY N/A 09/10/2016   Procedure: COLONOSCOPY;  Surgeon: NRogene Houston MD;  [facility];  Service: Endoscopy;  Laterality: N/A;  1200   COLONOSCOPY W/ POLYPECTOMY  2011   [facility]-Dr. Rehman   COLONOSCOPY WITH PROPOFOL N/A 10/15/2021   Procedure: COLONOSCOPY WITH PROPOFOL;  Surgeon: RRogene Houston MD;  [facility];  Service: Endoscopy;  Laterality: N/A;  1020   cortisone injection Left 01/13/2016   left shoulder   cortisone injection Left 09/21/2017   left shoulder   CYSTOSCOPY  01/20/2018   Procedure: CYSTOSCOPY FLEXIBLE;  Surgeon: MCleon Gustin MD;  [facility]  Service: Urology;;  no seeds found in bladder   EYE SURGERY     laser surgery to repair retina tare   head reconstruction  1966   due to head injury in war   LIPOMA EXCISION Left 02/01/2019   Procedure: EXCISION LIPOMA LEFT UPPER EXTREMITY;  Surgeon: JAviva Signs MD;  Location: [facility];  Service: General;  Laterality:  Left;   PROSTATE BIOPSY  05/20/2016   RADIOACTIVE SEED IMPLANT N/A 01/20/2018   Procedure: RADIOACTIVE SEED IMPLANT/BRACHYTHERAPY IMPLANT;  Surgeon: Cleon Gustin, MD;  [facility];  Service: Urology;  Laterality: N/A;     88    seeds implanted   REFRACTIVE SURGERY Left 03/05/2017   to resolve cloudiness   SPACE OAR INSTILLATION N/A 01/20/2018   Procedure: SPACE OAR INSTILLATION;  Surgeon: Cleon Gustin, MD;  Location: [facility];  Service: Urology;  Laterality: N/A;   TRIGGER FINGER RELEASE     Patient Active Problem List   Diagnosis Date Noted   Type 2 diabetes mellitus ([place]) 08/20/2020   Pure hypercholesterolemia 08/20/2020   Polyp of colon 08/20/2020   Other ill-defined and unknown causes of  morbidity and mortality 08/20/2020   Impaired fasting glucose 08/20/2020   Gout 08/20/2020   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 08/20/2020   Blood in urine 08/20/2020   Benign essential hypertension 08/20/2020   Hyperlipemia 08/20/2020   Hypertension 08/20/2020   Type 2 diabetes mellitus with hyperglycemia ([place]) 08/20/2020   Erectile dysfunction 07/31/2020   Benign prostatic hyperplasia with urinary obstruction 07/26/2019   Nocturia 07/26/2019   Lipoma of left upper extremity    Prostate cancer ([place]) 10/25/2017   Family history of cancer 10/25/2017   Bursitis/tendonitis, shoulder 09/21/2017   Hx of colonic polyps 07/03/2016   Esophageal reflux 05/22/2005    PCP: Sharilyn Sites MD  REFERRING PROVIDER: Newman Pies, MD  REFERRING DIAG: [phone] (ICD-10-CM) - Spinal stenosis, lumbar region with neurogenic claudication  Rationale for Evaluation and Treatment: Rehabilitation  THERAPY DIAG:  Other low back pain  ONSET DATE: 2-3 months ago   SUBJECTIVE:                                                                                                                                                                                           SUBJECTIVE STATEMENT: Patient continued back and leg symptoms. States increased symptoms with bridge. Did exercises 2x/day. States pain with reaching overhead.   PERTINENT HISTORY:  Arthritis, DM, Hx of cancer   PAIN:  Are you having pain? Yes: NPRS scale: 5/10 Pain location: Lt low back, radiates to LT leg Pain description: aching, sharp  Aggravating factors: standing, walking, bending , lifting  Relieving factors: rest, meds   PRECAUTIONS: None  WEIGHT BEARING RESTRICTIONS: No  FALLS:  Has patient fallen in last 6 months? No  LIVING ENVIRONMENT: Lives with: lives with their spouse Lives in: House/apartment Stairs: No Has following equipment at home: Single point cane, Environmental consultant - 2 wheeled, Crutches, and  Wheelchair (manual)  OCCUPATION: Retired Furniture conservator/restorer   PLOF: Independent  PATIENT GOALS: "Get good enough I don't have to get surgery"  NEXT MD VISIT:   OBJECTIVE:   DIAGNOSTIC FINDINGS:  IMPRESSION: 1. Multilevel degenerative changes as above have overall progressed since 2013, most notably as follows. 2. Disc bulge and moderate bilateral facet arthropathy at L4-L5 contributing to moderate to severe spinal canal stenosis with effacement of the subarticular zones and impingement of the traversing right L5 nerve root, and moderate right and mild left neural foraminal stenosis. 3. Disc bulge eccentric to the right and right worse than left facet arthropathy at L5-S1 resulting in narrowing of the right subarticular zone with possible irritation of the traversing right S1 nerve root, and severe right neural foraminal stenosis. 4. Extraforaminal disc material on the right at L1-L2 may contact and displace the exiting L1 nerve root. 5. Effacement of the left subarticular zone at L2-L3 with suspected impingement of the traversing L3 nerve root. There is also possible contact of the exiting left L2 nerve root by extraforaminal disc material.    PATIENT SURVEYS:  FOTO 52% function   COGNITION: Overall cognitive status: Within functional limits for tasks assessed     SENSATION: WFL  POSTURE:  slouched seated posture  PALPATION: Mod TTP about bilateral lumbar paraspinals, LT gluteal muscles   LUMBAR ROM:   AROM eval  Flexion 25% limited  Extension 100% limited  Right lateral flexion 50% limited  Left lateral flexion 50% limited  Right rotation   Left rotation    (Blank rows = not tested)   LOWER EXTREMITY MMT:    MMT Right eval Left eval  Hip flexion 4+ 4+  Hip extension 3- 3- P!  Hip abduction 4 4+  Hip adduction    Hip internal rotation    Hip external rotation    Knee flexion    Knee extension 5 5  Ankle dorsiflexion 5 5  Ankle plantarflexion    Ankle  inversion    Ankle eversion     (Blank rows = not tested)   FUNCTIONAL TESTS:  5 times sit to stand: 15.437 sec using UEs  SPECIAL TESTS: (+) slump on LT, (+) sacral compression, (+) SLR on LT   GAIT:  TODAY'S TREATMENT:                                                                                                                              DATE:  05/13/22 Bridge 1x 10 2-3 second holds Supine Sciatic Nerve Glide  1x10 bilateral Supine Figure 4 Piriformis Stretch 2x 30 second holds bilateral  Supine DKTC with heels on green ball 2 x 10  SLR 2 x 10 bilateral  LTR 1x 10 with 5 second holds Sidelying hip abduction 1 x 10 bilateral LAQ 10 x 5 second holds Standing hip extension 2x 10 bilateral   05/07/22 Eval    PATIENT EDUCATION:  Education details: 05/13/22: HEP;  EVAL:  on Eval findings, POC and HEP  Person educated: Patient Education method: Explanation Education comprehension: verbalized understanding  HOME EXERCISE PROGRAM: Access Code: [email or web address] URL: [email or web address]  05/13/22 - Supine Active Straight Leg Raise  - 2 x daily - 7 x weekly - 2 sets - 10 reps - Supine Lower Trunk Rotation  - 2 x daily - 7 x weekly - 1-2 sets - 10 reps - 5 second hold - Sidelying Hip Abduction  - 2 x daily - 7 x weekly - 2 sets - 10 reps  Date: 05/07/2022 - Supine Bridge  - 2-3 x daily - 7 x weekly - 1-2 sets - 10 reps - 3 second hold - Supine Sciatic Nerve Glide  - 2-3 x daily - 7 x weekly - 2 sets - 10 reps - Supine Figure 4 Piriformis Stretch  - 2-3 x daily - 7 x weekly - 1 sets - 3 reps - 30 seconds hold  ASSESSMENT:  CLINICAL IMPRESSION: Patient requires cueing for mechanics and hold times of previously competed exercises. He has been holding bridge for 30 second holds which was likely contributing to increase in symptoms. Poor carry over to cueing for positioning/mechanics with frequent reminders needed. Continued with core and hip strength and lumbar  mobility exercises which are tolerated well with short intermittent rest breaks. Patient will continue to benefit from physical therapy in order to improve function and reduce impairment.    OBJECTIVE IMPAIRMENTS: Abnormal gait, decreased activity tolerance, decreased mobility, difficulty walking, decreased ROM, decreased strength, hypomobility, increased fascial restrictions, impaired flexibility, improper body mechanics, and pain.   ACTIVITY LIMITATIONS: carrying, lifting, bending, sitting, standing, squatting, sleeping, stairs, transfers, bed mobility, and locomotion level  PARTICIPATION LIMITATIONS: meal prep, cleaning, laundry, driving, shopping, community activity, and yard work  PERSONAL FACTORS: Age and 1-2 comorbidities: See above  are also affecting patient's functional outcome.   REHAB POTENTIAL: Good  CLINICAL DECISION MAKING: Stable/uncomplicated  EVALUATION COMPLEXITY: Low   GOALS: SHORT TERM GOALS: Target date: 05/28/2022  Patient will be independent with initial HEP and self-management strategies to improve functional outcomes Baseline:  Goal status: INITIAL   LONG TERM GOALS: Target date: 06/18/2022  Patient will be independent with advanced HEP and self-management strategies to improve functional outcomes Baseline:  Goal status: INITIAL  2.  Patient will improve FOTO score to predicted value to indicate improvement in functional outcomes Baseline: 52% function  Goal status: INITIAL  3.  Patient will report reduction of back pain to 0/10 for improved quality of life and ability to perform ADLs  Baseline: 5-6/10 Goal status: INITIAL  4. Patient will have equal to or > 4+/5 MMT throughout BLE to improve ability to perform functional mobility, stair ambulation and ADLs.  Baseline: See MMT Goal status: INITIAL  PLAN:  PT FREQUENCY: 1-2x/week  PT DURATION: 6 weeks  PLANNED INTERVENTIONS: Therapeutic exercises, Therapeutic activity, Neuromuscular re-education,  Balance training, Gait training, Patient/Family education, Joint manipulation, Joint mobilization, Stair training, Aquatic Therapy, Dry Needling, Electrical stimulation, Spinal manipulation, Spinal mobilization, Cryotherapy, Moist heat, scar mobilization, Taping, Traction, Ultrasound, Biofeedback, Ionotophoresis '4mg'$ /ml Dexamethasone, and Manual therapy. Marland Kitchen  PLAN FOR NEXT SESSION: Progress hip and core strength as tolerated. Improve pain free lumbar AROM as able.   10:25 AM, 05/13/22 Mearl Latin PT, DPT Physical Therapist at [facility]

## 2022-05-19 ENCOUNTER — Ambulatory Visit (HOSPITAL_COMMUNITY): Payer: Medicare Other | Attending: Physical Medicine and Rehabilitation | Admitting: Physical Therapy

## 2022-05-19 DIAGNOSIS — M5459 Other low back pain: Secondary | ICD-10-CM | POA: Insufficient documentation

## 2022-05-19 NOTE — Therapy (Signed)
OUTPATIENT PHYSICAL THERAPY TREATMENT   Patient Name: Robert Zhang MRN: [id number] DOB:[birth year], 77 y.o., male Today's Date: 05/19/2022  END OF SESSION:  PT End of Session - 05/19/22 1410     Visit Number 3    Number of Visits 12    Date for PT Re-Evaluation 06/18/22    Authorization Type [employer] Medicare    Progress Note Due on Visit 10    PT Start Time 1407    PT Stop Time 1430    PT Time Calculation (min) 23 min    Activity Tolerance Patient tolerated treatment well    Behavior During Therapy WFL for tasks assessed/performed             Past Medical History:  Diagnosis Date   Aortic atherosclerosis ([place]) 10/12/2017   Noted on CT    Arthritis    bilateral legs /   Neck-Degenerative Disc Disease   Carotid artery occlusion    Right Carotid    Cervical spondylosis    Cervical stenosis of spine    Degenerative disc disease, cervical    C4-7   Diabetes mellitus without complication (HCC)    diet and exercise controlled, no medications, hgb A1C 07/31/2017 (6.9)   Erectile dysfunction 07/31/2020   External hemorrhoids    Hepatic cyst 10/12/2017   Low density lession right hepatic lobe, noted on CT   Hepatitis    History of colon polyps    Hypertension    Leg cramps    Prostate CA ([place])    Protrusion of intervertebral disc of lumbosacral region    L5-S1   PTSD (post-traumatic stress disorder)    PTSD (post-traumatic stress disorder)    Pulmonary nodule 11/26/2016   57m subpleural right lower lobe, noted on CT ABD/PELVIS   Right shoulder tendinitis    Sleep apnea    Stop Bang score of 5   [place]roke ([place]   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   Past Surgical History:  Procedure Laterality Date   CATARACT EXTRACTION W/PHACO  07/20/2011   Procedure: CATARACT EXTRACTION PHACO AND INTRAOCULAR LENS PLACEMENT ([place];  Surgeon: CWilliams Che MD;  Location: [facility];  Service: Ophthalmology;  Laterality: Right;  CDE=25.73   CATARACT EXTRACTION W/PHACO   08/31/2011   Procedure: CATARACT EXTRACTION PHACO AND INTRAOCULAR LENS PLACEMENT (IOC);  Surgeon: CWilliams Che MD;  Location: [facility];  Service: Ophthalmology;  Laterality: Left;  CDE: 38.59   COLONOSCOPY N/A 09/10/2016   Procedure: COLONOSCOPY;  Surgeon: NRogene Houston MD;  Location: [facility];  Service: Endoscopy;  Laterality: N/A;  1200   COLONOSCOPY W/ POLYPECTOMY  2011   [facility]-Dr. Rehman   COLONOSCOPY WITH PROPOFOL N/A 10/15/2021   Procedure: COLONOSCOPY WITH PROPOFOL;  Surgeon: RRogene Houston MD;  [facility];  Service: Endoscopy;  Laterality: N/A;  1020   cortisone injection Left 01/13/2016   left shoulder   cortisone injection Left 09/21/2017   left shoulder   CYSTOSCOPY  01/20/2018   Procedure: CYSTOSCOPY FLEXIBLE;  Surgeon: MCleon Gustin MD;  [facility]  Service: Urology;;  no seeds found in bladder   EYE SURGERY     laser surgery to repair retina tare   head reconstruction  1966   due to head injury in war   LIPOMA EXCISION Left 02/01/2019   Procedure: EXCISION LIPOMA LEFT UPPER EXTREMITY;  Surgeon: JAviva Signs MD;  Location: [facility];  Service: General;  Laterality:  Left;   PROSTATE BIOPSY  05/20/2016   RADIOACTIVE SEED IMPLANT N/A 01/20/2018   Procedure: RADIOACTIVE SEED IMPLANT/BRACHYTHERAPY IMPLANT;  Surgeon: Cleon Gustin, MD;  Location: [facility];  Service: Urology;  Laterality: N/A;     88    seeds implanted   REFRACTIVE SURGERY Left 03/05/2017   to resolve cloudiness   SPACE OAR INSTILLATION N/A 01/20/2018   Procedure: SPACE OAR INSTILLATION;  Surgeon: Cleon Gustin, MD;  Location: [facility];  Service: Urology;  Laterality: N/A;   TRIGGER FINGER RELEASE     Patient Active Problem List   Diagnosis Date Noted   Type 2 diabetes mellitus ([name]) 08/20/2020   Pure hypercholesterolemia 08/20/2020   Polyp of colon 08/20/2020   Other ill-defined and unknown causes of  morbidity and mortality 08/20/2020   Impaired fasting glucose 08/20/2020   Gout 08/20/2020   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 08/20/2020   Blood in urine 08/20/2020   Benign essential hypertension 08/20/2020   Hyperlipemia 08/20/2020   Hypertension 08/20/2020   Type 2 diabetes mellitus with hyperglycemia ([name]) 08/20/2020   Erectile dysfunction 07/31/2020   Benign prostatic hyperplasia with urinary obstruction 07/26/2019   Nocturia 07/26/2019   Lipoma of left upper extremity    Prostate cancer ([place]) 10/25/2017   Family history of cancer 10/25/2017   Bursitis/tendonitis, shoulder 09/21/2017   Hx of colonic polyps 07/03/2016   Esophageal reflux 05/22/2005    PCP: Sharilyn Sites MD  REFERRING PROVIDER: Newman Pies, MD  REFERRING DIAG: [phone] (ICD-10-CM) - Spinal stenosis, lumbar region with neurogenic claudication  Rationale for Evaluation and Treatment: Rehabilitation  THERAPY DIAG:  Other low back pain  ONSET DATE: 2-3 months ago   SUBJECTIVE:                                                                                                                                                                                           SUBJECTIVE STATEMENT: Ongoing pain. Certain exercises hurt more than others. Bridge is painful, hip stretch makes him ache sometimes for hours afterward.   PERTINENT HISTORY:  Arthritis, DM, Hx of cancer   PAIN:  Are you having pain? Yes: NPRS scale: 7/10 Pain location: Lt low back, radiates to LT leg Pain description: aching, sharp  Aggravating factors: standing, walking, bending , lifting  Relieving factors: rest, meds   PRECAUTIONS: None  WEIGHT BEARING RESTRICTIONS: No  FALLS:  Has patient fallen in last 6 months? No  LIVING ENVIRONMENT: Lives with: lives with their spouse Lives in: House/apartment Stairs: No Has following equipment at home: Single point cane, Environmental consultant - 2 wheeled, [place],  and  Wheelchair (manual)  OCCUPATION: Retired Furniture conservator/restorer   PLOF: Independent  PATIENT GOALS: "Get good enough I don't have to get surgery"  NEXT MD VISIT:   OBJECTIVE:   DIAGNOSTIC FINDINGS:  IMPRESSION: 1. Multilevel degenerative changes as above have overall progressed since 2013, most notably as follows. 2. Disc bulge and moderate bilateral facet arthropathy at L4-L5 contributing to moderate to severe spinal canal stenosis with effacement of the subarticular zones and impingement of the traversing right L5 nerve root, and moderate right and mild left neural foraminal stenosis. 3. Disc bulge eccentric to the right and right worse than left facet arthropathy at L5-S1 resulting in narrowing of the right subarticular zone with possible irritation of the traversing right S1 nerve root, and severe right neural foraminal stenosis. 4. Extraforaminal disc material on the right at L1-L2 may contact and displace the exiting L1 nerve root. 5. Effacement of the left subarticular zone at L2-L3 with suspected impingement of the traversing L3 nerve root. There is also possible contact of the exiting left L2 nerve root by extraforaminal disc material.    PATIENT SURVEYS:  FOTO 52% function   COGNITION: Overall cognitive status: Within functional limits for tasks assessed     SENSATION: WFL  POSTURE:  slouched seated posture  PALPATION: Mod TTP about bilateral lumbar paraspinals, LT gluteal muscles   LUMBAR ROM:   AROM eval  Flexion 25% limited  Extension 100% limited  Right lateral flexion 50% limited  Left lateral flexion 50% limited  Right rotation   Left rotation    (Blank rows = not tested)   LOWER EXTREMITY MMT:    MMT Right eval Left eval  Hip flexion 4+ 4+  Hip extension 3- 3- P!  Hip abduction 4 4+  Hip adduction    Hip internal rotation    Hip external rotation    Knee flexion    Knee extension 5 5  Ankle dorsiflexion 5 5  Ankle plantarflexion    Ankle  inversion    Ankle eversion     (Blank rows = not tested)   FUNCTIONAL TESTS:  5 times sit to stand: 15.437 sec using UEs  SPECIAL TESTS: (+) slump on LT, (+) sacral compression, (+) SLR on LT   GAIT:  TODAY'S TREATMENT:                                                                                                                              DATE:  05/19/22 LTR 15 x 3" each  SKTC 15 x 3" Supine Sciatic Nerve Glide  1x15 bilateral Supine Figure 4 Piriformis Stretch 2x 30 second holds bilateral  Supine hip abduction/ adduction iso 15 x 5" each  Ab march x20   05/13/22 Bridge 1x 10 2-3 second holds Supine Sciatic Nerve Glide  1x10 bilateral Supine Figure 4 Piriformis Stretch 2x 30 second holds bilateral  Supine DKTC with heels on green ball 2 x 10  SLR 2  x 10 bilateral  LTR 1x 10 with 5 second holds Sidelying hip abduction 1 x 10 bilateral LAQ 10 x 5 second holds Standing hip extension 2x 10 bilateral   05/07/22 Eval    PATIENT EDUCATION:  Education details: 05/13/22: HEP;  EVAL: on Eval findings, POC and HEP  Person educated: Patient Education method: Explanation Education comprehension: verbalized understanding  HOME EXERCISE PROGRAM: Access Code: [email or web address] URL: [email or web address] 05/[email or web address]/22 Access Code: [email or web address] URL: [email or web address] Date: 05/19/2022 Prepared by: Josue Hector  Exercises - Supine Sciatic Nerve Glide  - 2-3 x daily - 7 x weekly - 2 sets - 10 reps - Supine Figure 4 Piriformis Stretch  - 2-3 x daily - 7 x weekly - 1 sets - 3 reps - 30 seconds hold - Supine Active Straight Leg Raise  - 2 x daily - 7 x weekly - 2 sets - 10 reps - Supine Lower Trunk Rotation  - 2 x daily - 7 x weekly - 1-2 sets - 10 reps - 5 second hold - Sidelying Hip Abduction  - 2 x daily - 7 x weekly - 2 sets - 10 reps - Supine Hip Adduction Isometric with Ball  - 2 x daily - 7 x weekly - 2 sets - 10 reps - 5 second hold - Hooklying Isometric  Hip Abduction with Belt  - 2 x daily - 7 x weekly - 2 sets - 10 reps - 5 second hold  05/13/22 - Supine Active Straight Leg Raise  - 2 x daily - 7 x weekly - 2 sets - 10 reps - Supine Lower Trunk Rotation  - 2 x daily - 7 x weekly - 1-2 sets - 10 reps - 5 second hold - Sidelying Hip Abduction  - 2 x daily - 7 x weekly - 2 sets - 10 reps  Date: 05/07/2022 - Supine Bridge  - 2-3 x daily - 7 x weekly - 1-2 sets - 10 reps - 3 second hold - Supine Sciatic Nerve Glide  - 2-3 x daily - 7 x weekly - 2 sets - 10 reps - Supine Figure 4 Piriformis Stretch  - 2-3 x daily - 7 x weekly - 1 sets - 3 reps - 30 seconds hold  ASSESSMENT:  CLINICAL IMPRESSION: Graded activity and modified exercises for improved patient comfort. Held hip bridge. Focused on glute and hip isometrics to improved core strength with decreased lumbar strain. Patient educated on purpose and function of all added exercises. Added to HEP and issued handout. Patient will continue to benefit from skilled therapy services to reduce remaining deficits and improve functional ability.     OBJECTIVE IMPAIRMENTS: Abnormal gait, decreased activity tolerance, decreased mobility, difficulty walking, decreased ROM, decreased strength, hypomobility, increased fascial restrictions, impaired flexibility, improper body mechanics, and pain.   ACTIVITY LIMITATIONS: carrying, lifting, bending, sitting, standing, squatting, sleeping, stairs, transfers, bed mobility, and locomotion level  PARTICIPATION LIMITATIONS: meal prep, cleaning, laundry, driving, shopping, community activity, and yard work  PERSONAL FACTORS: Age and 1-2 comorbidities: See above  are also affecting patient's functional outcome.   REHAB POTENTIAL: Good  CLINICAL DECISION MAKING: Stable/uncomplicated  EVALUATION COMPLEXITY: Low   GOALS: SHORT TERM GOALS: Target date: 05/28/2022  Patient will be independent with initial HEP and self-management strategies to improve functional  outcomes Baseline:  Goal status: INITIAL   LONG TERM GOALS: Target date: 06/18/2022  Patient will be independent with advanced HEP and self-management strategies to improve functional outcomes Baseline:  Goal status: INITIAL  2.  Patient will improve FOTO score to predicted value to indicate improvement in functional outcomes Baseline: 52% function  Goal status: INITIAL  3.  Patient will report reduction of back pain to 0/10 for improved quality of life and ability to perform ADLs  Baseline: 5-6/10 Goal status: INITIAL  4. Patient will have equal to or > 4+/5 MMT throughout BLE to improve ability to perform functional mobility, stair ambulation and ADLs.  Baseline: See MMT Goal status: INITIAL  PLAN:  PT FREQUENCY: 1-2x/week  PT DURATION: 6 weeks  PLANNED INTERVENTIONS: Therapeutic exercises, Therapeutic activity, Neuromuscular re-education, Balance training, Gait training, Patient/Family education, Joint manipulation, Joint mobilization, Stair training, Aquatic Therapy, Dry Needling, Electrical stimulation, Spinal manipulation, Spinal mobilization, Cryotherapy, Moist heat, scar mobilization, Taping, Traction, Ultrasound, Biofeedback, Ionotophoresis '4mg'$ /ml Dexamethasone, and Manual therapy. Marland Kitchen  PLAN FOR NEXT SESSION: Progress hip and core strength as tolerated. Improve pain free lumbar AROM as able.    2:11 PM, 05/19/22 Josue Hector PT DPT  Physical Therapist with [facility]  [phone]

## 2022-05-26 ENCOUNTER — Ambulatory Visit (HOSPITAL_COMMUNITY): Payer: Medicare Other | Admitting: Physical Therapy

## 2022-05-26 DIAGNOSIS — M5459 Other low back pain: Secondary | ICD-10-CM

## 2022-05-26 NOTE — Therapy (Signed)
OUTPATIENT PHYSICAL THERAPY TREATMENT   Patient Name: Robert Zhang MRN: [id number] DOB:[birth year], 77 y.o., male Today's Date: 05/26/2022  END OF SESSION:  PT End of Session - 05/26/22 1310     Visit Number 4    Number of Visits 12    Date for PT Re-Evaluation 06/18/22    Authorization Type [employer]    Progress Note Due on Visit 10    PT Start Time 1306    PT Stop Time 1344    PT Time Calculation (min) 38 min    Activity Tolerance Patient tolerated treatment well    Behavior During Therapy WFL for tasks assessed/performed             Past Medical History:  Diagnosis Date   Aortic atherosclerosis ([place]) 10/12/2017   Noted on CT    Arthritis    bilateral legs /   Neck-Degenerative Disc Disease   Carotid artery occlusion    Right Carotid    Cervical spondylosis    Cervical stenosis of spine    Degenerative disc disease, cervical    C4-7   Diabetes mellitus without complication ([place])    diet and exercise controlled, no medications, hgb A1C 07/31/2017 (6.9)   Erectile dysfunction 07/31/2020   External hemorrhoids    Hepatic cyst 10/12/2017   Low density lession right hepatic lobe, noted on CT   Hepatitis    History of colon polyps    Hypertension    Leg cramps    Prostate CA ([place])    Protrusion of intervertebral disc of lumbosacral region    L5-S1   PTSD (post-traumatic stress disorder)    PTSD (post-traumatic stress disorder)    Pulmonary nodule 11/26/2016   32m subpleural right lower lobe, noted on CT ABD/PELVIS   Right shoulder tendinitis    Sleep apnea    Stop Bang score of 5   Stroke ([place] 2016   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   Past Surgical History:  Procedure Laterality Date   CATARACT EXTRACTION W/PHACO  07/20/2011   Procedure: CATARACT EXTRACTION PHACO AND INTRAOCULAR LENS PLACEMENT ([place];  Surgeon: CWilliams Che MD;  Location: [facility];  Service: Ophthalmology;  Laterality: Right;  CDE=25.73   CATARACT EXTRACTION W/PHACO   08/31/2011   Procedure: CATARACT EXTRACTION PHACO AND INTRAOCULAR LENS PLACEMENT (I[facility]);  Surgeon: CWilliams Che MD;  Location: [facility];  Service: Ophthalmology;  Laterality: Left;  CDE: 38.59   COLONOSCOPY N/A 09/10/2016   Procedure: COLONOSCOPY;  Surgeon: NRogene Houston MD;  Location: [facility];  Service: Endoscopy;  Laterality: N/A;  1200   COLONOSCOPY W/ POLYPECTOMY  2011   [facility]-Dr. Rehman   COLONOSCOPY WITH PROPOFOL N/A 10/15/2021   Procedure: COLONOSCOPY WITH PROPOFOL;  Surgeon: RRogene Houston MD;  [facility]: [facility];  Service: Endoscopy;  Laterality: N/A;  1020   cortisone injection Left 01/13/2016   left shoulder   cortisone injection Left 09/21/2017   left shoulder   CYSTOSCOPY  01/20/2018   Procedure: CYSTOSCOPY FLEXIBLE;  Surgeon: MCleon Gustin MD;  [facility]  Service: Urology;;  no seeds found in bladder   EYE SURGERY     laser surgery to repair retina tare   head reconstruction  1966   due to head injury in war   LIPOMA EXCISION Left 02/01/2019   Procedure: EXCISION LIPOMA LEFT UPPER EXTREMITY;  Surgeon: JAviva Signs MD;  Location: [facility];  Service: General;  Laterality:  Left;   PROSTATE BIOPSY  05/20/2016   RADIOACTIVE SEED IMPLANT N/A 01/20/2018   Procedure: RADIOACTIVE SEED IMPLANT/BRACHYTHERAPY IMPLANT;  Surgeon: Cleon Gustin, MD;  Location[facility];  Service: Urology;  Laterality: N/A;     88    seeds implanted   REFRACTIVE SURGERY Left 03/05/2017   to resolve cloudiness   SPACE OAR INSTILLATION N/A 01/20/2018   Procedure: SPACE OAR INSTILLATION;  Surgeon: Cleon Gustin, MD;  Location: [facility];  Service: Urology;  Laterality: N/A;   TRIGGER FINGER RELEASE     Patient Active Problem List   Diagnosis Date Noted   Type 2 diabetes mellitus ([place]) 08/20/2020   Pure hypercholesterolemia 08/20/2020   Polyp of colon 08/20/2020   Other ill-defined and unknown causes of  morbidity and mortality 08/20/2020   Impaired fasting glucose 08/20/2020   Gout 08/20/2020   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 08/20/2020   Blood in urine 08/20/2020   Benign essential hypertension 08/20/2020   Hyperlipemia 08/20/2020   Hypertension 08/20/2020   Type 2 diabetes mellitus with hyperglycemia ([place]) 08/20/2020   Erectile dysfunction 07/31/2020   Benign prostatic hyperplasia with urinary obstruction 07/26/2019   Nocturia 07/26/2019   Lipoma of left upper extremity    Prostate cancer ([place]) 10/25/2017   Family history of cancer 10/25/2017   Bursitis/tendonitis, shoulder 09/21/2017   Hx of colonic polyps 07/03/2016   Esophageal reflux 05/22/2005    PCP: Sharilyn Sites MD  REFERRING PROVIDER: Newman Pies, MD  REFERRING DIAG: [id number] (ICD-10-CM) - Spinal stenosis, lumbar region with neurogenic claudication  Rationale for Evaluation and Treatment: Rehabilitation  THERAPY DIAG:  Other low back pain  ONSET DATE: 2-3 months ago   SUBJECTIVE:                                                                                                                                                                                           SUBJECTIVE STATEMENT: Feels about the same. The rain is making his shoulder hurt a little more.   PERTINENT HISTORY:  Arthritis, DM, Hx of cancer   PAIN:  Are you having pain? Yes: NPRS scale: 5-6/10 Pain location: low back, radiates to LT leg Pain description: aching, sharp  Aggravating factors: standing, walking, bending , lifting  Relieving factors: rest, meds   PRECAUTIONS: None  WEIGHT BEARING RESTRICTIONS: No  FALLS:  Has patient fallen in last 6 months? No  LIVING ENVIRONMENT: Lives with: lives with their spouse Lives in: House/apartment Stairs: No Has following equipment at home: Single point cane, Environmental consultant - 2 wheeled, Crutches, and Wheelchair (manual)  OCCUPATION: Retired Furniture conservator/restorer  PLOF:  Independent  PATIENT GOALS: "Get good enough I don't have to get surgery"  NEXT MD VISIT:   OBJECTIVE:   DIAGNOSTIC FINDINGS:  IMPRESSION: 1. Multilevel degenerative changes as above have overall progressed since 2013, most notably as follows. 2. Disc bulge and moderate bilateral facet arthropathy at L4-L5 contributing to moderate to severe spinal canal stenosis with effacement of the subarticular zones and impingement of the traversing right L5 nerve root, and moderate right and mild left neural foraminal stenosis. 3. Disc bulge eccentric to the right and right worse than left facet arthropathy at L5-S1 resulting in narrowing of the right subarticular zone with possible irritation of the traversing right S1 nerve root, and severe right neural foraminal stenosis. 4. Extraforaminal disc material on the right at L1-L2 may contact and displace the exiting L1 nerve root. 5. Effacement of the left subarticular zone at L2-L3 with suspected impingement of the traversing L3 nerve root. There is also possible contact of the exiting left L2 nerve root by extraforaminal disc material.    PATIENT SURVEYS:  FOTO 52% function   COGNITION: Overall cognitive status: Within functional limits for tasks assessed     SENSATION: WFL  POSTURE:  slouched seated posture  PALPATION: Mod TTP about bilateral lumbar paraspinals, LT gluteal muscles   LUMBAR ROM:   AROM eval  Flexion 25% limited  Extension 100% limited  Right lateral flexion 50% limited  Left lateral flexion 50% limited  Right rotation   Left rotation    (Blank rows = not tested)   LOWER EXTREMITY MMT:    MMT Right eval Left eval  Hip flexion 4+ 4+  Hip extension 3- 3- P!  Hip abduction 4 4+  Hip adduction    Hip internal rotation    Hip external rotation    Knee flexion    Knee extension 5 5  Ankle dorsiflexion 5 5  Ankle plantarflexion    Ankle inversion    Ankle eversion     (Blank rows = not  tested)   FUNCTIONAL TESTS:  5 times sit to stand: 15.437 sec using UEs  SPECIAL TESTS: (+) slump on LT, (+) sacral compression, (+) SLR on LT   GAIT:  TODAY'S TREATMENT:                                                                                                                              DATE:  05/26/22 Sit to stands 2 x 10  Heel raise 2 x10 Toe raise 2 x 10 Hip abduction 2 x 10 Hip extension 2 x 10 4 inch step up 2 x 10  4 inch side step x 10 each RTB rows 2 x 10 RTB extension 2 x 10  05/19/22 LTR 15 x 3" each  SKTC 15 x 3" Supine Sciatic Nerve Glide  1x15 bilateral Supine Figure 4 Piriformis Stretch 2x 30 second holds bilateral  Supine hip abduction/ adduction iso 15 x 5" each  Ab march x20   05/13/22 Bridge 1x 10 2-3 second holds Supine Sciatic Nerve Glide  1x10 bilateral Supine Figure 4 Piriformis Stretch 2x 30 second holds bilateral  Supine DKTC with heels on green ball 2 x 10  SLR 2 x 10 bilateral  LTR 1x 10 with 5 second holds Sidelying hip abduction 1 x 10 bilateral LAQ 10 x 5 second holds Standing hip extension 2x 10 bilateral   05/07/22 Eval    PATIENT EDUCATION:  Education details: 05/13/22: HEP;  EVAL: on Eval findings, POC and HEP  Person educated: Patient Education method: Explanation Education comprehension: verbalized understanding  HOME EXERCISE PROGRAM: Access Code: [email or web address] URL: [email or web address] 05/26/22 - Heel Raises with Counter Support  - 2 x daily - 7 x weekly - 2 sets - 10 reps - Standing Hip Abduction with Counter Support  - 2 x daily - 7 x weekly - 2 sets - 10 reps - Standing Hip Extension with Counter Support  - 2 x daily - 7 x weekly - 2 sets - 10 reps - Standing Shoulder Row with Anchored Resistance  - 2 x daily - 7 x weekly - 2 sets - 10 reps - Shoulder extension with resistance - Neutral  - 2 x daily - 7 x weekly - 2 sets - 10 reps  05/19/22 Access Code: [email or web address] URL:  [email or web address] Date: 05/19/2022 Prepared by: Josue Hector  Exercises - Supine Sciatic Nerve Glide  - 2-3 x daily - 7 x weekly - 2 sets - 10 reps - Supine Figure 4 Piriformis Stretch  - 2-3 x daily - 7 x weekly - 1 sets - 3 reps - 30 seconds hold - Supine Active Straight Leg Raise  - 2 x daily - 7 x weekly - 2 sets - 10 reps - Supine Lower Trunk Rotation  - 2 x daily - 7 x weekly - 1-2 sets - 10 reps - 5 second hold - Sidelying Hip Abduction  - 2 x daily - 7 x weekly - 2 sets - 10 reps - Supine Hip Adduction Isometric with Ball  - 2 x daily - 7 x weekly - 2 sets - 10 reps - 5 second hold - Hooklying Isometric Hip Abduction with Belt  - 2 x daily - 7 x weekly - 2 sets - 10 reps - 5 second hold  05/13/22 - Supine Active Straight Leg Raise  - 2 x daily - 7 x weekly - 2 sets - 10 reps - Supine Lower Trunk Rotation  - 2 x daily - 7 x weekly - 1-2 sets - 10 reps - 5 second hold - Sidelying Hip Abduction  - 2 x daily - 7 x weekly - 2 sets - 10 reps  Date: 05/07/2022 - Supine Bridge  - 2-3 x daily - 7 x weekly - 1-2 sets - 10 reps - 3 second hold - Supine Sciatic Nerve Glide  - 2-3 x daily - 7 x weekly - 2 sets - 10 reps - Supine Figure 4 Piriformis Stretch  - 2-3 x daily - 7 x weekly - 1 sets - 3 reps - 30 seconds hold  ASSESSMENT:  CLINICAL IMPRESSION: Patient tolerated session well today. Progressed hip and core strengthening with good return. Patient educated on purpose and function of all added exercises. Patient does require verbal cues to engage core and maintain neutral spine and good posturing with standing ther ex. Patient will continue to benefit from skilled therapy services to reduce remaining deficits and improve  functional ability.    OBJECTIVE IMPAIRMENTS: Abnormal gait, decreased activity tolerance, decreased mobility, difficulty walking, decreased ROM, decreased strength, hypomobility, increased fascial restrictions, impaired flexibility, improper body  mechanics, and pain.   ACTIVITY LIMITATIONS: carrying, lifting, bending, sitting, standing, squatting, sleeping, stairs, transfers, bed mobility, and locomotion level  PARTICIPATION LIMITATIONS: meal prep, cleaning, laundry, driving, shopping, community activity, and yard work  PERSONAL FACTORS: Age and 1-2 comorbidities: See above  are also affecting patient's functional outcome.   REHAB POTENTIAL: Good  CLINICAL DECISION MAKING: Stable/uncomplicated  EVALUATION COMPLEXITY: Low   GOALS: SHORT TERM GOALS: Target date: 05/28/2022  Patient will be independent with initial HEP and self-management strategies to improve functional outcomes Baseline:  Goal status: INITIAL   LONG TERM GOALS: Target date: 06/18/2022  Patient will be independent with advanced HEP and self-management strategies to improve functional outcomes Baseline:  Goal status: INITIAL  2.  Patient will improve FOTO score to predicted value to indicate improvement in functional outcomes Baseline: 52% function  Goal status: INITIAL  3.  Patient will report reduction of back pain to 0/10 for improved quality of life and ability to perform ADLs  Baseline: 5-6/10 Goal status: INITIAL  4. Patient will have equal to or > 4+/5 MMT throughout BLE to improve ability to perform functional mobility, stair ambulation and ADLs.  Baseline: See MMT Goal status: INITIAL  PLAN:  PT FREQUENCY: 1-2x/week  PT DURATION: 6 weeks  PLANNED INTERVENTIONS: Therapeutic exercises, Therapeutic activity, Neuromuscular re-education, Balance training, Gait training, Patient/Family education, Joint manipulation, Joint mobilization, Stair training, Aquatic Therapy, Dry Needling, Electrical stimulation, Spinal manipulation, Spinal mobilization, Cryotherapy, Moist heat, scar mobilization, Taping, Traction, Ultrasound, Biofeedback, Ionotophoresis '4mg'$ /ml Dexamethasone, and Manual therapy. Marland Kitchen  PLAN FOR NEXT SESSION: Progress hip and core strength as  tolerated. Improve pain free lumbar AROM as able. Add balance next session  1:47 PM, 05/26/22 Josue Hector PT DPT  Physical Therapist with [facility]  [phone]

## 2022-06-02 ENCOUNTER — Ambulatory Visit (HOSPITAL_COMMUNITY): Payer: Medicare Other | Admitting: Physical Therapy

## 2022-06-02 ENCOUNTER — Encounter (HOSPITAL_COMMUNITY): Payer: Self-pay | Admitting: Physical Therapy

## 2022-06-02 DIAGNOSIS — M5459 Other low back pain: Secondary | ICD-10-CM | POA: Diagnosis not present

## 2022-06-02 NOTE — Therapy (Signed)
OUTPATIENT PHYSICAL THERAPY TREATMENT   Patient Name: Robert Zhang MRN: [id number] DOB:[birth year], 77 y.o., male Today's Date: 06/02/2022  END OF SESSION:  PT End of Session - 06/02/22 1345     Visit Number 5    Number of Visits 12    Date for PT Re-Evaluation 06/18/22    Authorization Type [employer]    Progress Note Due on Visit 10    PT Start Time 1345    PT Stop Time 1423    PT Time Calculation (min) 38 min    Activity Tolerance Patient tolerated treatment well    Behavior During Therapy WFL for tasks assessed/performed             Past Medical History:  Diagnosis Date   Aortic atherosclerosis ([place]) 10/12/2017   Noted on CT    Arthritis    bilateral legs /   Neck-Degenerative Disc Disease   Carotid artery occlusion    Right Carotid    Cervical spondylosis    Cervical stenosis of spine    Degenerative disc disease, cervical    C4-7   Diabetes mellitus without complication (HCC)    diet and exercise controlled, no medications, hgb A1C 07/31/2017 (6.9)   Erectile dysfunction 07/31/2020   External hemorrhoids    Hepatic cyst 10/12/2017   Low density lession right hepatic lobe, noted on CT   Hepatitis    History of colon polyps    Hypertension    Leg cramps    Prostate CA ([place])    Protrusion of intervertebral disc of lumbosacral region    L5-S1   PTSD (post-traumatic stress disorder)    PTSD (post-traumatic stress disorder)    Pulmonary nodule 11/26/2016   62m subpleural right lower lobe, noted on CT ABD/PELVIS   Right shoulder tendinitis    Sleep apnea    Stop Bang score of 5   [place]   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   Past Surgical History:  Procedure Laterality Date   CATARACT EXTRACTION W/PHACO  07/20/2011   Procedure: CATARACT EXTRACTION PHACO AND INTRAOCULAR LENS PLACEMENT ([place];  Surgeon: CWilliams Che MD;  Location: [facility];  Service: Ophthalmology;  Laterality: Right;  CDE=25.73   CATARACT EXTRACTION W/PHACO   08/31/2011   Procedure: CATARACT EXTRACTION PHACO AND INTRAOCULAR LENS PLACEMENT (I[facility]);  Surgeon: CWilliams Che MD;  Location: [facility];  Service: Ophthalmology;  Laterality: Left;  CDE: 38.59   COLONOSCOPY N/A 09/10/2016   Procedure: COLONOSCOPY;  Surgeon: NRogene Houston MD;  Location: [facility];  Service: Endoscopy;  Laterality: N/A;  1200   COLONOSCOPY W/ POLYPECTOMY  2011   [facility]-Dr. Rehman   COLONOSCOPY WITH PROPOFOL N/A 10/15/2021   Procedure: COLONOSCOPY WITH PROPOFOL;  Surgeon: RRogene Houston MD;  [facility]: [facility];  Service: Endoscopy;  Laterality: N/A;  1020   cortisone injection Left 01/13/2016   left shoulder   cortisone injection Left 09/21/2017   left shoulder   CYSTOSCOPY  01/20/2018   Procedure: CYSTOSCOPY FLEXIBLE;  Surgeon: MCleon Gustin MD;  [facility]  Service: Urology;;  no seeds found in bladder   EYE SURGERY     laser surgery to repair retina tare   head reconstruction  1966   due to head injury in war   LIPOMA EXCISION Left 02/01/2019   Procedure: EXCISION LIPOMA LEFT UPPER EXTREMITY;  Surgeon: JAviva Signs MD;  Location: [facility];  Service: General;  Laterality:  Left;   PROSTATE BIOPSY  05/20/2016   RADIOACTIVE SEED IMPLANT N/A 01/20/2018   Procedure: RADIOACTIVE SEED IMPLANT/BRACHYTHERAPY IMPLANT;  Surgeon: Cleon Gustin, MD;  Location: [facility];  Service: Urology;  Laterality: N/A;     88    seeds implanted   REFRACTIVE SURGERY Left 03/05/2017   to resolve cloudiness   SPACE OAR INSTILLATION N/A 01/20/2018   Procedure: SPACE OAR INSTILLATION;  Surgeon: Cleon Gustin, MD;  Location: [facility];  Service: Urology;  Laterality: N/A;   TRIGGER FINGER RELEASE     Patient Active Problem List   Diagnosis Date Noted   Type 2 diabetes mellitus ([place]) 08/20/2020   Pure hypercholesterolemia 08/20/2020   Polyp of colon 08/20/2020   Other ill-defined and unknown causes of  morbidity and mortality 08/20/2020   Impaired fasting glucose 08/20/2020   Gout 08/20/2020   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 08/20/2020   Blood in urine 08/20/2020   Benign essential hypertension 08/20/2020   Hyperlipemia 08/20/2020   Hypertension 08/20/2020   Type 2 diabetes mellitus with hyperglycemia ([place]) 08/20/2020   Erectile dysfunction 07/31/2020   Benign prostatic hyperplasia with urinary obstruction 07/26/2019   Nocturia 07/26/2019   Lipoma of left upper extremity    Prostate cancer ([place]) 10/25/2017   Family history of cancer 10/25/2017   Bursitis/tendonitis, shoulder 09/21/2017   Hx of colonic polyps 07/03/2016   Esophageal reflux 05/22/2005    PCP: Sharilyn Sites MD  REFERRING PROVIDER: Newman Pies, MD  REFERRING DIAG: [phone] (ICD-10-CM) - Spinal stenosis, lumbar region with neurogenic claudication  Rationale for Evaluation and Treatment: Rehabilitation  THERAPY DIAG:  Other low back pain  ONSET DATE: 2-3 months ago   SUBJECTIVE:                                                                                                                                                                                           SUBJECTIVE STATEMENT: Patient states on prednisone for gout which is helping back. Sometimes symptoms into L leg with going from sitting to standing after sitting a while.   PERTINENT HISTORY:  Arthritis, DM, Hx of cancer   PAIN:  Are you having pain? Yes: NPRS scale: 4/10 Pain location: low back, radiates to LT leg Pain description: aching, sharp  Aggravating factors: standing, walking, bending , lifting  Relieving factors: rest, meds   PRECAUTIONS: None  WEIGHT BEARING RESTRICTIONS: No  FALLS:  Has patient fallen in last 6 months? No  LIVING ENVIRONMENT: Lives with: lives with their spouse Lives in: House/apartment Stairs: No Has following equipment at home: Single point cane, Environmental consultant -  2 wheeled,  Crutches, and Wheelchair (manual)  OCCUPATION: Retired Furniture conservator/restorer   PLOF: Independent  PATIENT GOALS: "Get good enough I don't have to get surgery"  NEXT MD VISIT:   OBJECTIVE:   DIAGNOSTIC FINDINGS:  IMPRESSION: 1. Multilevel degenerative changes as above have overall progressed since 2013, most notably as follows. 2. Disc bulge and moderate bilateral facet arthropathy at L4-L5 contributing to moderate to severe spinal canal stenosis with effacement of the subarticular zones and impingement of the traversing right L5 nerve root, and moderate right and mild left neural foraminal stenosis. 3. Disc bulge eccentric to the right and right worse than left facet arthropathy at L5-S1 resulting in narrowing of the right subarticular zone with possible irritation of the traversing right S1 nerve root, and severe right neural foraminal stenosis. 4. Extraforaminal disc material on the right at L1-L2 may contact and displace the exiting L1 nerve root. 5. Effacement of the left subarticular zone at L2-L3 with suspected impingement of the traversing L3 nerve root. There is also possible contact of the exiting left L2 nerve root by extraforaminal disc material.    PATIENT SURVEYS:  FOTO 52% function   COGNITION: Overall cognitive status: Within functional limits for tasks assessed     SENSATION: WFL  POSTURE:  slouched seated posture  PALPATION: Mod TTP about bilateral lumbar paraspinals, LT gluteal muscles   LUMBAR ROM:   AROM eval  Flexion 25% limited  Extension 100% limited  Right lateral flexion 50% limited  Left lateral flexion 50% limited  Right rotation   Left rotation    (Blank rows = not tested)   LOWER EXTREMITY MMT:    MMT Right eval Left eval  Hip flexion 4+ 4+  Hip extension 3- 3- P!  Hip abduction 4 4+  Hip adduction    Hip internal rotation    Hip external rotation    Knee flexion    Knee extension 5 5  Ankle dorsiflexion 5 5  Ankle  plantarflexion    Ankle inversion    Ankle eversion     (Blank rows = not tested)   FUNCTIONAL TESTS:  5 times sit to stand: 15.437 sec using UEs  SPECIAL TESTS: (+) slump on LT, (+) sacral compression, (+) SLR on LT   GAIT:  TODAY'S TREATMENT:                                                                                                                              DATE:  06/02/22 Seated figure 4 stretch 5 x 20 second holds LLE HR 1 x 20  TR 1 x 20  Seated trunk flexion stretch 10 x 10 second holds with red ball Sit to stands 2 x 10 6 inch step up 2 x 10 each 6 inch side step up 2x 10 each RTB rows 2 x 15 RTB extension 2 x 15  05/26/22 Sit to stands 2 x 10  Heel raise 2 x10 Toe  raise 2 x 10 Hip abduction 2 x 10 Hip extension 2 x 10 4 inch step up 2 x 10  4 inch side step x 10 each RTB rows 2 x 10 RTB extension 2 x 10  05/19/22 LTR 15 x 3" each  SKTC 15 x 3" Supine Sciatic Nerve Glide  1x15 bilateral Supine Figure 4 Piriformis Stretch 2x 30 second holds bilateral  Supine hip abduction/ adduction iso 15 x 5" each  Ab march x20   05/13/22 Bridge 1x 10 2-3 second holds Supine Sciatic Nerve Glide  1x10 bilateral Supine Figure 4 Piriformis Stretch 2x 30 second holds bilateral  Supine DKTC with heels on green ball 2 x 10  SLR 2 x 10 bilateral  LTR 1x 10 with 5 second holds Sidelying hip abduction 1 x 10 bilateral LAQ 10 x 5 second holds Standing hip extension 2x 10 bilateral   05/07/22 Eval    PATIENT EDUCATION:  Education details: 05/13/22: HEP;  EVAL: on Eval findings, POC and HEP  Person educated: Patient Education method: Explanation Education comprehension: verbalized understanding  HOME EXERCISE PROGRAM: Access Code: [email or web address] URL: [email or web address] 05/26/22 - Heel Raises with Counter Support  - 2 x daily - 7 x weekly - 2 sets - 10 reps - Standing Hip Abduction with Counter Support  - 2 x daily - 7 x weekly - 2 sets - 10 reps -  Standing Hip Extension with Counter Support  - 2 x daily - 7 x weekly - 2 sets - 10 reps - Standing Shoulder Row with Anchored Resistance  - 2 x daily - 7 x weekly - 2 sets - 10 reps - Shoulder extension with resistance - Neutral  - 2 x daily - 7 x weekly - 2 sets - 10 reps  05/19/22 Access Code: [email or web address] URL: [email or web address] Date: 05/19/2022 Prepared by: Josue Hector  Exercises - Supine Sciatic Nerve Glide  - 2-3 x daily - 7 x weekly - 2 sets - 10 reps - Supine Figure 4 Piriformis Stretch  - 2-3 x daily - 7 x weekly - 1 sets - 3 reps - 30 seconds hold - Supine Active Straight Leg Raise  - 2 x daily - 7 x weekly - 2 sets - 10 reps - Supine Lower Trunk Rotation  - 2 x daily - 7 x weekly - 1-2 sets - 10 reps - 5 second hold - Sidelying Hip Abduction  - 2 x daily - 7 x weekly - 2 sets - 10 reps - Supine Hip Adduction Isometric with Ball  - 2 x daily - 7 x weekly - 2 sets - 10 reps - 5 second hold - Hooklying Isometric Hip Abduction with Belt  - 2 x daily - 7 x weekly - 2 sets - 10 reps - 5 second hold  05/13/22 - Supine Active Straight Leg Raise  - 2 x daily - 7 x weekly - 2 sets - 10 reps - Supine Lower Trunk Rotation  - 2 x daily - 7 x weekly - 1-2 sets - 10 reps - 5 second hold - Sidelying Hip Abduction  - 2 x daily - 7 x weekly - 2 sets - 10 reps  Date: 05/07/2022 - Supine Bridge  - 2-3 x daily - 7 x weekly - 1-2 sets - 10 reps - 3 second hold - Supine Sciatic Nerve Glide  - 2-3 x daily - 7 x weekly - 2 sets - 10 reps - Supine Figure 4 Piriformis Stretch  -  2-3 x daily - 7 x weekly - 1 sets - 3 reps - 30 seconds hold  ASSESSMENT:  CLINICAL IMPRESSION: Began session with seated figure 4 stretch for improving hip mobility. Continued with LE, core and postural strengthening which is tolerated well. Able to progress in reps of previously completed exercises and educated on probable DOMS. Patient will continue to benefit from physical therapy in order to improve  function and reduce impairment.    OBJECTIVE IMPAIRMENTS: Abnormal gait, decreased activity tolerance, decreased mobility, difficulty walking, decreased ROM, decreased strength, hypomobility, increased fascial restrictions, impaired flexibility, improper body mechanics, and pain.   ACTIVITY LIMITATIONS: carrying, lifting, bending, sitting, standing, squatting, sleeping, stairs, transfers, bed mobility, and locomotion level  PARTICIPATION LIMITATIONS: meal prep, cleaning, laundry, driving, shopping, community activity, and yard work  PERSONAL FACTORS: Age and 1-2 comorbidities: See above  are also affecting patient's functional outcome.   REHAB POTENTIAL: Good  CLINICAL DECISION MAKING: Stable/uncomplicated  EVALUATION COMPLEXITY: Low   GOALS: SHORT TERM GOALS: Target date: 05/28/2022  Patient will be independent with initial HEP and self-management strategies to improve functional outcomes Baseline:  Goal status: INITIAL   LONG TERM GOALS: Target date: 06/18/2022  Patient will be independent with advanced HEP and self-management strategies to improve functional outcomes Baseline:  Goal status: INITIAL  2.  Patient will improve FOTO score to predicted value to indicate improvement in functional outcomes Baseline: 52% function  Goal status: INITIAL  3.  Patient will report reduction of back pain to 0/10 for improved quality of life and ability to perform ADLs  Baseline: 5-6/10 Goal status: INITIAL  4. Patient will have equal to or > 4+/5 MMT throughout BLE to improve ability to perform functional mobility, stair ambulation and ADLs.  Baseline: See MMT Goal status: INITIAL  PLAN:  PT FREQUENCY: 1-2x/week  PT DURATION: 6 weeks  PLANNED INTERVENTIONS: Therapeutic exercises, Therapeutic activity, Neuromuscular re-education, Balance training, Gait training, Patient/Family education, Joint manipulation, Joint mobilization, Stair training, Aquatic Therapy, Dry Needling,  Electrical stimulation, Spinal manipulation, Spinal mobilization, Cryotherapy, Moist heat, scar mobilization, Taping, Traction, Ultrasound, Biofeedback, Ionotophoresis '4mg'$ /ml Dexamethasone, and Manual therapy. Marland Kitchen  PLAN FOR NEXT SESSION: Progress hip and core strength as tolerated. Improve pain free lumbar AROM as able. Add balance next session  1:46 PM, 06/02/22 Mearl Latin PT, DPT Physical Therapist at [facility]

## 2022-06-09 ENCOUNTER — Ambulatory Visit (HOSPITAL_COMMUNITY): Payer: Medicare Other | Admitting: Physical Therapy

## 2022-06-09 DIAGNOSIS — M5459 Other low back pain: Secondary | ICD-10-CM | POA: Diagnosis not present

## 2022-06-09 NOTE — Therapy (Signed)
OUTPATIENT PHYSICAL THERAPY TREATMENT   Patient Name: Robert Zhang MRN: [id number] DOB:1945/07/23, 77 y.o., male Today's Date: 06/09/2022  END OF SESSION:  PT End of Session - 06/09/22 1035     Visit Number 6    Number of Visits 12    Date for PT Re-Evaluation 06/18/22    Authorization Type [employer]    Progress Note Due on Visit 10    PT Start Time 1033    PT Stop Time 1111    PT Time Calculation (min) 38 min    Activity Tolerance Patient tolerated treatment well    Behavior During Therapy WFL for tasks assessed/performed             Past Medical History:  Diagnosis Date   Aortic atherosclerosis ([place]) 10/12/2017   Noted on CT    Arthritis    bilateral legs /   Neck-Degenerative Disc Disease   Carotid artery occlusion    Right Carotid    Cervical spondylosis    Cervical stenosis of spine    Degenerative disc disease, cervical    C4-7   Diabetes mellitus without complication (HCC)    diet and exercise controlled, no medications, hgb A1C 07/31/2017 (6.9)   Erectile dysfunction 07/31/2020   External hemorrhoids    Hepatic cyst 10/12/2017   Low density lession right hepatic lobe, noted on CT   Hepatitis    History of colon polyps    Hypertension    Leg cramps    Prostate CA ([place])    Protrusion of intervertebral disc of lumbosacral region    L5-S1   PTSD (post-traumatic stress disorder)    PTSD (post-traumatic stress disorder)    Pulmonary nodule 11/26/2016   85m subpleural right lower lobe, noted on CT ABD/PELVIS   Right shoulder tendinitis    Sleep apnea    Stop Bang score of 5   [place]   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   Past Surgical History:  Procedure Laterality Date   CATARACT EXTRACTION W/PHACO  07/20/2011   Procedure: CATARACT EXTRACTION PHACO AND INTRAOCULAR LENS PLACEMENT ([place];  Surgeon: CWilliams Che MD;  Location: [facility];  Service: Ophthalmology;  Laterality: Right;  CDE=25.73   CATARACT EXTRACTION W/PHACO   08/31/2011   Procedure: CATARACT EXTRACTION PHACO AND INTRAOCULAR LENS PLACEMENT ([facility]);  Surgeon: CWilliams Che MD;  Location: [facility];  Service: Ophthalmology;  Laterality: Left;  CDE: 38.59   COLONOSCOPY N/A 09/10/2016   Procedure: COLONOSCOPY;  Surgeon: NRogene Houston MD;  Location: [facility];  Service: Endoscopy;  Laterality: N/A;  1200   COLONOSCOPY W/ POLYPECTOMY  2011   [facility]-Dr. Rehman   COLONOSCOPY WITH PROPOFOL N/A 10/15/2021   Procedure: COLONOSCOPY WITH PROPOFOL;  Surgeon: RRogene Houston MD;  [facility];  Service: Endoscopy;  Laterality: N/A;  1020   cortisone injection Left 01/13/2016   left shoulder   cortisone injection Left 09/21/2017   left shoulder   CYSTOSCOPY  01/20/2018   Procedure: CYSTOSCOPY FLEXIBLE;  Surgeon: MCleon Gustin MD;  [facility]  Service: Urology;;  no seeds found in bladder   EYE SURGERY     laser surgery to repair retina tare   head reconstruction  1966   due to head injury in war   LIPOMA EXCISION Left 02/01/2019   Procedure: EXCISION LIPOMA LEFT UPPER EXTREMITY;  Surgeon: JAviva Signs MD;  Location: [facility];  Service: General;  Laterality:  Left;   PROSTATE BIOPSY  05/20/2016   RADIOACTIVE SEED IMPLANT N/A 01/20/2018   Procedure: RADIOACTIVE SEED IMPLANT/BRACHYTHERAPY IMPLANT;  Surgeon: Cleon Gustin, MD;  Location: [facility];  Service: Urology;  Laterality: N/A;     88    seeds implanted   REFRACTIVE SURGERY Left 03/05/2017   to resolve cloudiness   SPACE OAR INSTILLATION N/A 01/20/2018   Procedure: SPACE OAR INSTILLATION;  Surgeon: Cleon Gustin, MD;  Location[facility];  Service: Urology;  Laterality: N/A;   TRIGGER FINGER RELEASE     Patient Active Problem List   Diagnosis Date Noted   Type 2 diabetes mellitus ([place]) 08/20/2020   Pure hypercholesterolemia 08/20/2020   Polyp of colon 08/20/2020   Other ill-defined and unknown causes of  morbidity and mortality 08/20/2020   Impaired fasting glucose 08/20/2020   Gout 08/20/2020   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 08/20/2020   Blood in urine 08/20/2020   Benign essential hypertension 08/20/2020   Hyperlipemia 08/20/2020   Hypertension 08/20/2020   Type 2 diabetes mellitus with hyperglycemia ([place]) 08/20/2020   Erectile dysfunction 07/31/2020   Benign prostatic hyperplasia with urinary obstruction 07/26/2019   Nocturia 07/26/2019   Lipoma of left upper extremity    Prostate cancer ([place]) 10/25/2017   Family history of cancer 10/25/2017   Bursitis/tendonitis, shoulder 09/21/2017   Hx of colonic polyps 07/03/2016   Esophageal reflux 05/22/2005    PCP: Sharilyn Sites MD  REFERRING PROVIDER: Newman Pies, MD  REFERRING DIAG: [phone] (ICD-10-CM) - Spinal stenosis, lumbar region with neurogenic claudication  Rationale for Evaluation and Treatment: Rehabilitation  THERAPY DIAG:  Other low back pain  ONSET DATE: 2-3 months ago   SUBJECTIVE:                                                                                                                                                                                           SUBJECTIVE STATEMENT: Patient says he feels about the same. He thinks he may be doing some things a little better, but the pain in his back is about the same. He has a follow up with the MD in a few weeks.   PERTINENT HISTORY:  Arthritis, DM, Hx of cancer   PAIN:  Are you having pain? Yes: NPRS scale: 6-7/10 Pain location: low back, radiates to LT leg Pain description: aching, sharp  Aggravating factors: standing, walking, bending , lifting  Relieving factors: rest, meds   PRECAUTIONS: None  WEIGHT BEARING RESTRICTIONS: No  FALLS:  Has patient fallen in last 6 months? No  LIVING ENVIRONMENT: Lives with: lives with their  spouse Lives in: House/apartment Stairs: No Has following equipment at home: Single  point cane, Environmental consultant - 2 wheeled, Crutches, and Wheelchair (manual)  OCCUPATION: Retired Furniture conservator/restorer   PLOF: Independent  PATIENT GOALS: "Get good enough I don't have to get surgery"  NEXT MD VISIT:   OBJECTIVE:   DIAGNOSTIC FINDINGS:  IMPRESSION: 1. Multilevel degenerative changes as above have overall progressed since 2013, most notably as follows. 2. Disc bulge and moderate bilateral facet arthropathy at L4-L5 contributing to moderate to severe spinal canal stenosis with effacement of the subarticular zones and impingement of the traversing right L5 nerve root, and moderate right and mild left neural foraminal stenosis. 3. Disc bulge eccentric to the right and right worse than left facet arthropathy at L5-S1 resulting in narrowing of the right subarticular zone with possible irritation of the traversing right S1 nerve root, and severe right neural foraminal stenosis. 4. Extraforaminal disc material on the right at L1-L2 may contact and displace the exiting L1 nerve root. 5. Effacement of the left subarticular zone at L2-L3 with suspected impingement of the traversing L3 nerve root. There is also possible contact of the exiting left L2 nerve root by extraforaminal disc material.    PATIENT SURVEYS:  FOTO 52% function   COGNITION: Overall cognitive status: Within functional limits for tasks assessed     SENSATION: WFL  POSTURE:  slouched seated posture  PALPATION: Mod TTP about bilateral lumbar paraspinals, LT gluteal muscles   LUMBAR ROM:   AROM eval  Flexion 25% limited  Extension 100% limited  Right lateral flexion 50% limited  Left lateral flexion 50% limited  Right rotation   Left rotation    (Blank rows = not tested)   LOWER EXTREMITY MMT:    MMT Right eval Left eval  Hip flexion 4+ 4+  Hip extension 3- 3- P!  Hip abduction 4 4+  Hip adduction    Hip internal rotation    Hip external rotation    Knee flexion    Knee extension 5 5  Ankle  dorsiflexion 5 5  Ankle plantarflexion    Ankle inversion    Ankle eversion     (Blank rows = not tested)   FUNCTIONAL TESTS:  5 times sit to stand: 15.437 sec using UEs  SPECIAL TESTS: (+) slump on LT, (+) sacral compression, (+) SLR on LT   GAIT:  TODAY'S TREATMENT:                                                                                                                              DATE:  06/09/22 Nu step 4 min lv 2 dynamic warmup  HR x 20  TR x 20  Sit to stands 2 x 10 6 inch step up 2 x 10 each Standing hip abduction GTB 2 x 10  Standing hip extension GTB 2 x 10 Tandem stance 4 x 15" each GTB rows 2 x 15 GTB extension 2 x 15  06/02/22 Seated figure 4 stretch 5 x 20 second holds LLE HR 1 x 20  TR 1 x 20  Seated trunk flexion stretch 10 x 10 second holds with red ball Sit to stands 2 x 10 6 inch step up 2 x 10 each 6 inch side step up 2x 10 each RTB rows 2 x 15 RTB extension 2 x 15  05/26/22 Sit to stands 2 x 10  Heel raise 2 x10 Toe raise 2 x 10 Hip abduction 2 x 10 Hip extension 2 x 10 4 inch step up 2 x 10  4 inch side step x 10 each RTB rows 2 x 10 RTB extension 2 x 10    PATIENT EDUCATION:  Education details: 05/13/22: HEP;  EVAL: on Eval findings, POC and HEP  Person educated: Patient Education method: Explanation Education comprehension: verbalized understanding  HOME EXERCISE PROGRAM: Access Code: [email or web address] URL: [email or web address] 05/26/22 - Heel Raises with Counter Support  - 2 x daily - 7 x weekly - 2 sets - 10 reps - Standing Hip Abduction with Counter Support  - 2 x daily - 7 x weekly - 2 sets - 10 reps - Standing Hip Extension with Counter Support  - 2 x daily - 7 x weekly - 2 sets - 10 reps - Standing Shoulder Row with Anchored Resistance  - 2 x daily - 7 x weekly - 2 sets - 10 reps - Shoulder extension with resistance - Neutral  - 2 x daily - 7 x weekly - 2 sets - 10 reps  05/19/22 Access Code: [email or web address] URL:  [email or web address] Date: 05/19/2022 Prepared by: Josue Hector  Exercises - Supine Sciatic Nerve Glide  - 2-3 x daily - 7 x weekly - 2 sets - 10 reps - Supine Figure 4 Piriformis Stretch  - 2-3 x daily - 7 x weekly - 1 sets - 3 reps - 30 seconds hold - Supine Active Straight Leg Raise  - 2 x daily - 7 x weekly - 2 sets - 10 reps - Supine Lower Trunk Rotation  - 2 x daily - 7 x weekly - 1-2 sets - 10 reps - 5 second hold - Sidelying Hip Abduction  - 2 x daily - 7 x weekly - 2 sets - 10 reps - Supine Hip Adduction Isometric with Ball  - 2 x daily - 7 x weekly - 2 sets - 10 reps - 5 second hold - Hooklying Isometric Hip Abduction with Belt  - 2 x daily - 7 x weekly - 2 sets - 10 reps - 5 second hold  05/13/22 - Supine Active Straight Leg Raise  - 2 x daily - 7 x weekly - 2 sets - 10 reps - Supine Lower Trunk Rotation  - 2 x daily - 7 x weekly - 1-2 sets - 10 reps - 5 second hold - Sidelying Hip Abduction  - 2 x daily - 7 x weekly - 2 sets - 10 reps  Date: 05/07/2022 - Supine Bridge  - 2-3 x daily - 7 x weekly - 1-2 sets - 10 reps - 3 second hold - Supine Sciatic Nerve Glide  - 2-3 x daily - 7 x weekly - 2 sets - 10 reps - Supine Figure 4 Piriformis Stretch  - 2-3 x daily - 7 x weekly - 1 sets - 3 reps - 30 seconds hold  ASSESSMENT:  CLINICAL IMPRESSION: Patient well challenged with ther ex progressions. Most notably with balance. Patient cued on proper form  and function of standing hip extension, and added band resistance. Patient noting increased muscle fatigue end of session. Patient will continue to benefit from skilled therapy services to reduce remaining deficits and improve functional ability.     OBJECTIVE IMPAIRMENTS: Abnormal gait, decreased activity tolerance, decreased mobility, difficulty walking, decreased ROM, decreased strength, hypomobility, increased fascial restrictions, impaired flexibility, improper body mechanics, and pain.   ACTIVITY LIMITATIONS:  carrying, lifting, bending, sitting, standing, squatting, sleeping, stairs, transfers, bed mobility, and locomotion level  PARTICIPATION LIMITATIONS: meal prep, cleaning, laundry, driving, shopping, community activity, and yard work  PERSONAL FACTORS: Age and 1-2 comorbidities: See above  are also affecting patient's functional outcome.   REHAB POTENTIAL: Good  CLINICAL DECISION MAKING: Stable/uncomplicated  EVALUATION COMPLEXITY: Low   GOALS: SHORT TERM GOALS: Target date: 05/28/2022  Patient will be independent with initial HEP and self-management strategies to improve functional outcomes Baseline:  Goal status: INITIAL   LONG TERM GOALS: Target date: 06/18/2022  Patient will be independent with advanced HEP and self-management strategies to improve functional outcomes Baseline:  Goal status: INITIAL  2.  Patient will improve FOTO score to predicted value to indicate improvement in functional outcomes Baseline: 52% function  Goal status: INITIAL  3.  Patient will report reduction of back pain to 0/10 for improved quality of life and ability to perform ADLs  Baseline: 5-6/10 Goal status: INITIAL  4. Patient will have equal to or > 4+/5 MMT throughout BLE to improve ability to perform functional mobility, stair ambulation and ADLs.  Baseline: See MMT Goal status: INITIAL  PLAN:  PT FREQUENCY: 1-2x/week  PT DURATION: 6 weeks  PLANNED INTERVENTIONS: Therapeutic exercises, Therapeutic activity, Neuromuscular re-education, Balance training, Gait training, Patient/Family education, Joint manipulation, Joint mobilization, Stair training, Aquatic Therapy, Dry Needling, Electrical stimulation, Spinal manipulation, Spinal mobilization, Cryotherapy, Moist heat, scar mobilization, Taping, Traction, Ultrasound, Biofeedback, Ionotophoresis '4mg'$ /ml Dexamethasone, and Manual therapy. Marland Kitchen  PLAN FOR NEXT SESSION: Reassess. Progress hip and core strength as tolerated. Improve pain free lumbar  AROM as able.   11:12 AM, 06/09/22 Josue Hector PT DPT  Physical Therapist with [facility]  [phone]

## 2022-06-16 ENCOUNTER — Ambulatory Visit (HOSPITAL_COMMUNITY): Payer: Medicare Other | Admitting: Physical Therapy

## 2022-06-16 ENCOUNTER — Encounter (HOSPITAL_COMMUNITY): Payer: Self-pay | Admitting: Physical Therapy

## 2022-06-16 DIAGNOSIS — M5459 Other low back pain: Secondary | ICD-10-CM | POA: Diagnosis not present

## 2022-06-16 NOTE — Therapy (Signed)
OUTPATIENT PHYSICAL THERAPY TREATMENT   Patient Name: Robert Zhang MRN: [id number] DOB:1946-04-18, 77 y.o., male Today's Date: 06/16/2022  PHYSICAL THERAPY DISCHARGE SUMMARY  Visits from Start of Care: 7  Current functional level related to goals / functional outcomes: See below    Remaining deficits: See below    Education / Equipment: See assessment    Patient agrees to discharge. Patient goals were not met. Patient is being discharged due to lack of progress.  END OF SESSION:  PT End of Session - 06/16/22 1035     Visit Number 7    Number of Visits 12    Date for PT Re-Evaluation 06/18/22    Authorization Type [employer] Medicare    Progress Note Due on Visit 10    PT Start Time 1034    PT Stop Time 1101    PT Time Calculation (min) 27 min    Activity Tolerance Patient tolerated treatment well    Behavior During Therapy WFL for tasks assessed/performed             Past Medical History:  Diagnosis Date   Aortic atherosclerosis ([place]) 10/12/2017   Noted on CT    Arthritis    bilateral legs /   Neck-Degenerative Disc Disease   Carotid artery occlusion    Right Carotid    Cervical spondylosis    Cervical stenosis of spine    Degenerative disc disease, cervical    C4-7   Diabetes mellitus without complication (HCC)    diet and exercise controlled, no medications, hgb A1C 07/31/2017 (6.9)   Erectile dysfunction 07/31/2020   External hemorrhoids    Hepatic cyst 10/12/2017   Low density lession right hepatic lobe, noted on CT   Hepatitis    History of colon polyps    Hypertension    Leg cramps    Prostate CA ([place])    Protrusion of intervertebral disc of lumbosacral region    L5-S1   PTSD (post-traumatic stress disorder)    PTSD (post-traumatic stress disorder)    Pulmonary nodule 11/26/2016   81m subpleural right lower lobe, noted on CT ABD/PELVIS   Right shoulder tendinitis    Sleep apnea    Stop Bang score of 5   Stroke ([place] 2016   per head CT patient  had mild left brain stroke   Past Surgical History:  Procedure Laterality Date   CATARACT EXTRACTION W/PHACO  07/20/2011   Procedure: CATARACT EXTRACTION PHACO AND INTRAOCULAR LENS PLACEMENT ([place];  Surgeon: CWilliams Che MD;  Location: [facility];  Service: Ophthalmology;  Laterality: Right;  CDE=25.73   CATARACT EXTRACTION W/PHACO  08/31/2011   Procedure: CATARACT EXTRACTION PHACO AND INTRAOCULAR LENS PLACEMENT (I[facility]);  Surgeon: CWilliams Che MD;  Location: [facility];  Service: Ophthalmology;  Laterality: Left;  CDE: 38.59   COLONOSCOPY N/A 09/10/2016   Procedure: COLONOSCOPY;  Surgeon: NRogene Houston MD;  Location: [facility];  Service: Endoscopy;  Laterality: N/A;  1200   COLONOSCOPY W/ POLYPECTOMY  2011   [facility]-Dr. Rehman   COLONOSCOPY WITH PROPOFOL N/A 10/15/2021   Procedure: COLONOSCOPY WITH PROPOFOL;  Surgeon: RRogene Houston MD;  Location: [facility];  Service: Endoscopy;  Laterality: N/A;  1020   cortisone injection Left 01/13/2016   left shoulder   cortisone injection Left 09/21/2017   left shoulder   CYSTOSCOPY  01/20/2018   Procedure: CYSTOSCOPY FLEXIBLE;  Surgeon: MCleon Gustin MD;  [facility]  Service: Urology;;  no  seeds found in bladder   EYE SURGERY     laser surgery to repair retina tare   head reconstruction  1966   due to head injury in war   LIPOMA EXCISION Left 02/01/2019   Procedure: EXCISION LIPOMA LEFT UPPER EXTREMITY;  Surgeon: Aviva Signs, MD;  Location: [facility];  Service: General;  Laterality: Left;   PROSTATE BIOPSY  05/20/2016   RADIOACTIVE SEED IMPLANT N/A 01/20/2018   Procedure: RADIOACTIVE SEED IMPLANT/BRACHYTHERAPY IMPLANT;  Surgeon: Cleon Gustin, MD;  [facility];  Service: Urology;  Laterality: N/A;     88    seeds implanted   REFRACTIVE SURGERY Left 03/05/2017   to resolve cloudiness   SPACE OAR INSTILLATION N/A 01/20/2018   Procedure: SPACE OAR INSTILLATION;  Surgeon:  Cleon Gustin, MD;  Location: [facility];  Service: Urology;  Laterality: N/A;   TRIGGER FINGER RELEASE     Patient Active Problem List   Diagnosis Date Noted   Type 2 diabetes mellitus ([place]) 08/20/2020   Pure hypercholesterolemia 08/20/2020   Polyp of colon 08/20/2020   Other ill-defined and unknown causes of morbidity and mortality 08/20/2020   Impaired fasting glucose 08/20/2020   Gout 08/20/2020   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 08/20/2020   Blood in urine 08/20/2020   Benign essential hypertension 08/20/2020   Hyperlipemia 08/20/2020   Hypertension 08/20/2020   Type 2 diabetes mellitus with hyperglycemia ([place]) 08/20/2020   Erectile dysfunction 07/31/2020   Benign prostatic hyperplasia with urinary obstruction 07/26/2019   Nocturia 07/26/2019   Lipoma of left upper extremity    Prostate cancer ([place]) 10/25/2017   Family history of cancer 10/25/2017   Bursitis/tendonitis, shoulder 09/21/2017   Hx of colonic polyps 07/03/2016   Esophageal reflux 05/22/2005    PCP: Sharilyn Sites MD  REFERRING PROVIDER: Newman Pies, MD  REFERRING DIAG: [phone] (ICD-10-CM) - Spinal stenosis, lumbar region with neurogenic claudication  Rationale for Evaluation and Treatment: Rehabilitation  THERAPY DIAG:  Other low back pain  ONSET DATE: 2-3 months ago   SUBJECTIVE:                                                                                                                                                                                           SUBJECTIVE STATEMENT: Patient states he was very sore after last session. He doesn't feel he is able to do band exercises at home. He feels he is stronger as a result of therapy but his pain has not really changed. He states he is ready to proceed with surgery.   PERTINENT HISTORY:  Arthritis, DM, Hx  of cancer   PAIN:  Are you having pain? Yes: NPRS scale: 7/10 Pain location: low back,  radiates to LT leg Pain description: aching, sharp  Aggravating factors: standing, walking, bending , lifting  Relieving factors: rest, meds   PRECAUTIONS: None  WEIGHT BEARING RESTRICTIONS: No  FALLS:  Has patient fallen in last 6 months? No  LIVING ENVIRONMENT: Lives with: lives with their spouse Lives in: House/apartment Stairs: No Has following equipment at home: Single point cane, Environmental consultant - 2 wheeled, Health visitor, and Wheelchair (manual)  OCCUPATION: Retired Furniture conservator/restorer   PLOF: Independent  PATIENT GOALS: "Get good enough I don't have to get surgery"  NEXT MD VISIT:   OBJECTIVE:   DIAGNOSTIC FINDINGS:  IMPRESSION: 1. Multilevel degenerative changes as above have overall progressed since 2013, most notably as follows. 2. Disc bulge and moderate bilateral facet arthropathy at L4-L5 contributing to moderate to severe spinal canal stenosis with effacement of the subarticular zones and impingement of the traversing right L5 nerve root, and moderate right and mild left neural foraminal stenosis. 3. Disc bulge eccentric to the right and right worse than left facet arthropathy at L5-S1 resulting in narrowing of the right subarticular zone with possible irritation of the traversing right S1 nerve root, and severe right neural foraminal stenosis. 4. Extraforaminal disc material on the right at L1-L2 may contact and displace the exiting L1 nerve root. 5. Effacement of the left subarticular zone at L2-L3 with suspected impingement of the traversing L3 nerve root. There is also possible contact of the exiting left L2 nerve root by extraforaminal disc material.    PATIENT SURVEYS:  FOTO 44% (was 52% function)   COGNITION: Overall cognitive status: Within functional limits for tasks assessed     SENSATION: WFL  POSTURE:  slouched seated posture  PALPATION: Mod TTP about bilateral lumbar paraspinals, LT gluteal muscles   LUMBAR ROM:   AROM eval 06/16/22  Flexion 25%  limited WFL  Extension 100% limited 100% limited  Right lateral flexion 50% limited 25% limited  Left lateral flexion 50% limited 25% limited   Right rotation    Left rotation     (Blank rows = not tested)   LOWER EXTREMITY MMT:    MMT Right eval Left eval Right  06/16/22 Left 06/16/22  Hip flexion 4+ 4+ 5 5  Hip extension 3- 3- P! 3- 3-  Hip abduction 4 4+ 4+ 5  Hip adduction      Hip internal rotation      Hip external rotation      Knee flexion      Knee extension '5 5 5 5  '$ Ankle dorsiflexion '5 5 5 5  '$ Ankle plantarflexion      Ankle inversion      Ankle eversion       (Blank rows = not tested)   FUNCTIONAL TESTS:  5 times sit to stand: 15.437 sec using UEs  SPECIAL TESTS: (+) slump on LT, (+) sacral compression, (+) SLR on LT   GAIT:  TODAY'S TREATMENT:  DATE:  06/16/22 Reassess FOTO AROM MMT   06/09/22 Nu step 4 min lv 2 dynamic warmup  HR x 20  TR x 20  Sit to stands 2 x 10 6 inch step up 2 x 10 each Standing hip abduction GTB 2 x 10  Standing hip extension GTB 2 x 10 Tandem stance 4 x 15" each GTB rows 2 x 15 GTB extension 2 x 15  06/02/22 Seated figure 4 stretch 5 x 20 second holds LLE HR 1 x 20  TR 1 x 20  Seated trunk flexion stretch 10 x 10 second holds with red ball Sit to stands 2 x 10 6 inch step up 2 x 10 each 6 inch side step up 2x 10 each RTB rows 2 x 15 RTB extension 2 x 15    PATIENT EDUCATION:  Education details: 05/13/22: HEP;  EVAL: on Eval findings, POC and HEP  Person educated: Patient Education method: Explanation Education comprehension: verbalized understanding  HOME EXERCISE PROGRAM: Access Code: [email or web address] URL: [email or web address] 05/26/22 - Heel Raises with Counter Support  - 2 x daily - 7 x weekly - 2 sets - 10 reps - Standing Hip Abduction with Counter Support  - 2 x daily - 7  x weekly - 2 sets - 10 reps - Standing Hip Extension with Counter Support  - 2 x daily - 7 x weekly - 2 sets - 10 reps - Standing Shoulder Row with Anchored Resistance  - 2 x daily - 7 x weekly - 2 sets - 10 reps - Shoulder extension with resistance - Neutral  - 2 x daily - 7 x weekly - 2 sets - 10 reps  05/19/22 Access Code: [email or web address] URL: [email or web address] Date: 05/19/2022 Prepared by: Josue Hector  Exercises - Supine Sciatic Nerve Glide  - 2-3 x daily - 7 x weekly - 2 sets - 10 reps - Supine Figure 4 Piriformis Stretch  - 2-3 x daily - 7 x weekly - 1 sets - 3 reps - 30 seconds hold - Supine Active Straight Leg Raise  - 2 x daily - 7 x weekly - 2 sets - 10 reps - Supine Lower Trunk Rotation  - 2 x daily - 7 x weekly - 1-2 sets - 10 reps - 5 second hold - Sidelying Hip Abduction  - 2 x daily - 7 x weekly - 2 sets - 10 reps - Supine Hip Adduction Isometric with Ball  - 2 x daily - 7 x weekly - 2 sets - 10 reps - 5 second hold - Hook[place] Isometric Hip Abduction with Belt  - 2 x daily - 7 x weekly - 2 sets - 10 reps - 5 second hold  05/13/22 - Supine Active Straight Leg Raise  - 2 x daily - 7 x weekly - 2 sets - 10 reps - Supine Lower Trunk Rotation  - 2 x daily - 7 x weekly - 1-2 sets - 10 reps - 5 second hold - Sidelying Hip Abduction  - 2 x daily - 7 x weekly - 2 sets - 10 reps  Date: 05/07/2022 - Supine Bridge  - 2-3 x daily - 7 x weekly - 1-2 sets - 10 reps - 3 second hold - Supine Sciatic Nerve Glide  - 2-3 x daily - 7 x weekly - 2 sets - 10 reps - Supine Figure 4 Piriformis Stretch  - 2-3 x daily - 7 x weekly - 1 sets - 3 reps - 30 seconds hold  ASSESSMENT:  CLINICAL IMPRESSION: Patient has made minimal improvement since starting therapy. Slight improvement in lumbar ROM, yet still significantly limited in extension. Some improvement in MMTs as well, but remains limited in hip extension with some pain/ discomfort on LT. At this time, patient will be DC to  return to referring provider for further assessment due to lack of progress in therapy. Reviewed progress to goals and HEP. Answered all patient questions. Encouraged patient to follow up with therapy services with any further questions or concerns.    OBJECTIVE IMPAIRMENTS: Abnormal gait, decreased activity tolerance, decreased mobility, difficulty walking, decreased ROM, decreased strength, hypomobility, increased fascial restrictions, impaired flexibility, improper body mechanics, and pain.   ACTIVITY LIMITATIONS: carrying, lifting, bending, sitting, standing, squatting, sleeping, stairs, transfers, bed mobility, and locomotion level  PARTICIPATION LIMITATIONS: meal prep, cleaning, laundry, driving, shopping, community activity, and yard work  PERSONAL FACTORS: Age and 1-2 comorbidities: See above  are also affecting patient's functional outcome.   REHAB POTENTIAL: Good  CLINICAL DECISION MAKING: Stable/uncomplicated  EVALUATION COMPLEXITY: Low   GOALS: SHORT TERM GOALS: Target date: 05/28/2022  Patient will be independent with initial HEP and self-management strategies to improve functional outcomes Baseline:  Goal status: MET   LONG TERM GOALS: Target date: 06/18/2022  Patient will be independent with advanced HEP and self-management strategies to improve functional outcomes Baseline:  Goal status: MET  2.  Patient will improve FOTO score to predicted value to indicate improvement in functional outcomes Baseline: 44% (decreased 8%) Goal status: NOT MET  3.  Patient will report reduction of back pain to 0/10 for improved quality of life and ability to perform ADLs  Baseline: 7-8/10 Goal status: NOT MET  4. Patient will have equal to or > 4+/5 MMT throughout BLE to improve ability to perform functional mobility, stair ambulation and ADLs.  Baseline: See MMT (all except hip extension)  Goal status: Partially MET   PLAN:  PT FREQUENCY: 1-2x/week  PT DURATION: 6  weeks  PLANNED INTERVENTIONS: Therapeutic exercises, Therapeutic activity, Neuromuscular re-education, Balance training, Gait training, Patient/Family education, Joint manipulation, Joint mobilization, Stair training, Aquatic Therapy, Dry Needling, Electrical stimulation, Spinal manipulation, Spinal mobilization, Cryotherapy, Moist heat, scar mobilization, Taping, Traction, Ultrasound, Biofeedback, Ionotophoresis '4mg'$ /ml Dexamethasone, and Manual therapy. Marland Kitchen  PLAN FOR NEXT SESSION: DC to HEP   11:01 AM, 06/16/22 Josue Hector PT DPT  Physical Therapist with [facility]  [phone]

## 2022-06-19 ENCOUNTER — Other Ambulatory Visit: Payer: Self-pay | Admitting: Neurosurgery

## 2022-07-13 ENCOUNTER — Other Ambulatory Visit: Payer: Self-pay | Admitting: Neurosurgery

## 2022-07-17 HISTORY — PX: LUMBAR FUSION: SHX111

## 2022-07-20 NOTE — Pre-Procedure Instructions (Signed)
Surgical Instructions    Your procedure is scheduled on Wednesday, March 6.  Report to [facility] Main Entrance "A" at 7:00 A.M., then check in with the Admitting office.  Call this number if you have problems the morning of surgery:  [phone]   If you have any questions prior to your surgery date call [phone]: Open Monday-Friday 8am-4pm If you experience any cold or flu symptoms such as cough, fever, chills, shortness of breath, etc. between now and your scheduled surgery, please notify us at the above number     Remember:  Do not eat or drink after midnight the night before your surgery    Take these medicines the morning of surgery with A SIP OF WATER:  allopurinol (ZYLOPRIM)  gabapentin (NEURONTIN)   Follow your surgeon's instructions on when to stop Aspirin.  If no instructions were given by your surgeon then you will need to call the office to get those instructions.    As of today, STOP taking any diclofenac Sodium (VOLTAREN) 1 % GEL, Aleve, Naproxen, Ibuprofen, Motrin, Advil, Goody's, BC's, all herbal medications, fish oil, and all vitamins.  WHAT DO I DO ABOUT MY DIABETES MEDICATION?   Do not take oral diabetes medicines (pills) the morning of surgery.   The day of surgery, do not take other diabetes injectables, including Byetta (exenatide), Bydureon (exenatide ER), Victoza (liraglutide), or Trulicity (dulaglutide).  If your CBG is greater than 220 mg/dL, you may take  of your sliding scale (correction) dose of insulin.   HOW TO MANAGE YOUR DIABETES BEFORE AND AFTER SURGERY  Why is it important to control my blood sugar before and after surgery? Improving blood sugar levels before and after surgery helps healing and can limit problems. A way of improving blood sugar control is eating a healthy diet by:  Eating less sugar and carbohydrates  Increasing activity/exercise  Talking with your doctor about reaching your blood sugar goals High blood sugars  (greater than 180 mg/dL) can raise your risk of infections and slow your recovery, so you will need to focus on controlling your diabetes during the weeks before surgery. Make sure that the doctor who takes care of your diabetes knows about your planned surgery including the date and location.  How do I manage my blood sugar before surgery? Check your blood sugar at least 4 times a day, starting 2 days before surgery, to make sure that the level is not too high or low.  Check your blood sugar the morning of your surgery when you wake up and every 2 hours until you get to the Short Stay unit.  If your blood sugar is less than 70 mg/dL, you will need to treat for low blood sugar: Do not take insulin. Treat a low blood sugar (less than 70 mg/dL) with  cup of clear juice (cranberry or apple), 4 glucose tablets, OR glucose gel. Recheck blood sugar in 15 minutes after treatment (to make sure it is greater than 70 mg/dL). If your blood sugar is not greater than 70 mg/dL on recheck, call [phone] for further instructions. Report your blood sugar to the short stay nurse when you get to Short Stay.  If you are admitted to the hospital after surgery: Your blood sugar will be checked by the staff and you will probably be given insulin after surgery (instead of oral diabetes medicines) to make sure you have good blood sugar levels. The goal for blood sugar control after surgery is 80-180 mg/dL.   [facility] is not responsible for any belongings or valuables.    Do NOT Smoke (Tobacco/Vaping)  24 hours prior to your procedure  If you use a CPAP at night, you may bring your mask for your overnight stay.   Contacts, glasses, hearing aids, dentures or partials may not be worn into surgery, please bring cases for these belongings   For patients admitted to the hospital, discharge time will be determined by your treatment team.   Patients discharged the day of surgery will not be allowed to drive  home, and someone needs to stay with them for 24 hours.   SURGICAL WAITING ROOM VISITATION Patients having surgery or a procedure may have no more than 2 support people in the waiting area - these visitors may rotate.   Children under the age of 66 must have an adult with them who is not the patient. If the patient needs to stay at the hospital during part of their recovery, the visitor guidelines for inpatient rooms apply. Pre-op nurse will coordinate an appropriate time for 1 support person to accompany patient in pre-op.  This support person may not rotate.   Please refer to [email or web address] for the visitor guidelines for Inpatients (after your surgery is over and you are in a regular room).    Special instructions:    Oral Hygiene is also important to reduce your risk of infection.  Remember - BRUSH YOUR TEETH THE MORNING OF SURGERY WITH YOUR REGULAR TOOTHPASTE   [place]- Preparing For Surgery  Before surgery, you can play an important role. Because skin is not sterile, your skin needs to be as free of germs as possible. You can reduce the number of germs on your skin by washing with CHG (chlorahexidine gluconate) Soap before surgery.  CHG is an antiseptic cleaner which kills germs and bonds with the skin to continue killing germs even after washing.     Please do not use if you have an allergy to CHG or antibacterial soaps. If your skin becomes reddened/irritated stop using the CHG.  Do not shave (including legs and underarms) for at least 48 hours prior to first CHG shower. It is OK to shave your face.  Please follow these instructions carefully.     Shower the NIGHT BEFORE SURGERY and the MORNING OF SURGERY with CHG Soap.   If you chose to wash your hair, wash your hair first as usual with your normal shampoo. After you shampoo, rinse your hair and body thoroughly to remove the shampoo.  Then [employer] and genitals (private  parts) with your normal soap and rinse thoroughly to remove soap.  After that Use CHG Soap as you would any other liquid soap. You can apply CHG directly to the skin and wash gently with a scrungie or a clean washcloth.   Apply the CHG Soap to your body ONLY FROM THE NECK DOWN.  Do not use on open wounds or open sores. Avoid contact with your eyes, ears, mouth and genitals (private parts). Wash Face and genitals (private parts)  with your normal soap.   Wash thoroughly, paying special attention to the area where your surgery will be performed.  Thoroughly rinse your body with warm water from the neck down.  DO NOT shower/wash with your normal soap after using and rinsing off the CHG Soap.  Pat yourself dry with a CLEAN TOWEL.  Wear CLEAN PAJAMAS to bed the night before surgery  Place CLEAN SHEETS on your bed the night before  your surgery  DO NOT SLEEP WITH PETS.   Day of Surgery:  Take a shower with CHG soap. Wear Clean/Comfortable clothing the morning of surgery Do not wear jewelry or makeup. Do not wear lotions, powders, perfumes/cologne or deodorant. Do not shave 48 hours prior to surgery.  Men may shave face and neck. Do not bring valuables to the hospital. Do not wear nail polish, gel polish, artificial nails, or any other type of covering on natural nails (fingers and toes) If you have artificial nails or gel coating that need to be removed by a nail salon, please have this removed prior to surgery. Artificial nails or gel coating may interfere with anesthesia's ability to adequately monitor your vital signs.   Remember to brush your teeth WITH YOUR REGULAR TOOTHPASTE.    If you received a COVID test during your pre-op visit, it is requested that you wear a mask when out in public, stay away from anyone that may not be feeling well, and notify your surgeon if you develop symptoms. If you have been in contact with anyone that has tested positive in the last 10 days, please  notify your surgeon.    Please read over the following fact sheets that you were given.

## 2022-07-21 ENCOUNTER — Encounter (HOSPITAL_COMMUNITY)
Admission: RE | Admit: 2022-07-21 | Discharge: 2022-07-21 | Disposition: A | Discharge status: Home / Self Care | Payer: Medicare Other | Source: Ambulatory Visit | Attending: Neurosurgery | Admitting: Neurosurgery

## 2022-07-21 ENCOUNTER — Encounter: Payer: Self-pay | Admitting: Plastic Surgery

## 2022-07-21 ENCOUNTER — Ambulatory Visit (INDEPENDENT_AMBULATORY_CARE_PROVIDER_SITE_OTHER): Payer: No Typology Code available for payment source | Admitting: Plastic Surgery

## 2022-07-21 ENCOUNTER — Other Ambulatory Visit: Payer: Self-pay

## 2022-07-21 ENCOUNTER — Encounter (HOSPITAL_COMMUNITY): Payer: Self-pay

## 2022-07-21 VITALS — BP 164/84 | HR 79 | Ht 68.0 in | Wt 170.6 lb

## 2022-07-21 VITALS — BP 159/73 | HR 84 | Temp 97.5°F | Resp 17 | Ht 68.0 in | Wt 170.8 lb

## 2022-07-21 DIAGNOSIS — Z01818 Encounter for other preprocedural examination: Secondary | ICD-10-CM

## 2022-07-21 DIAGNOSIS — H02834 Dermatochalasis of left upper eyelid: Secondary | ICD-10-CM

## 2022-07-21 DIAGNOSIS — L723 Sebaceous cyst: Secondary | ICD-10-CM | POA: Diagnosis not present

## 2022-07-21 DIAGNOSIS — H02831 Dermatochalasis of right upper eyelid: Secondary | ICD-10-CM

## 2022-07-21 DIAGNOSIS — Z01812 Encounter for preprocedural laboratory examination: Secondary | ICD-10-CM | POA: Diagnosis not present

## 2022-07-21 LAB — TYPE AND SCREEN
ABO/RH(D): A POS
Antibody Screen: NEGATIVE

## 2022-07-21 LAB — SURGICAL PCR SCREEN
MRSA, PCR: NEGATIVE
Staphylococcus aureus: NEGATIVE

## 2022-07-21 NOTE — Anesthesia Preprocedure Evaluation (Signed)
Anesthesia Evaluation  Patient identified by MRN, date of birth, ID band Patient awake    Reviewed: Allergy & Precautions, NPO status , Patient's Chart, lab work & pertinent test results  History of Anesthesia Complications Negative for: history of anesthetic complications  Airway Mallampati: III   Neck ROM: Full    Dental  (+) Dental Advisory Given, Teeth Intact   Pulmonary sleep apnea , former smoker   Pulmonary exam normal        Cardiovascular hypertension, Pt. on medications Normal cardiovascular exam   '23 Carotid US - 40-59% right ICAS    Neuro/Psych  PSYCHIATRIC DISORDERS Anxiety      PTSD CVA, No Residual Symptoms    GI/Hepatic Neg liver ROS,GERD  Controlled,,  Endo/Other  diabetes, Well Controlled, Type 2    Renal/GU negative Renal ROS     Musculoskeletal  (+) Arthritis ,    Abdominal   Peds  Hematology negative hematology ROS (+)   Anesthesia Other Findings   Reproductive/Obstetrics                             Anesthesia Physical Anesthesia Plan  ASA: 3  Anesthesia Plan: General   Post-op Pain Management: Tylenol PO (pre-op)*   Induction: Intravenous  PONV Risk Score and Plan: 2 and Treatment may vary due to age or medical condition, Ondansetron and Dexamethasone  Airway Management Planned: Oral ETT  Additional Equipment: None  Intra-op Plan:   Post-operative Plan: Extubation in OR  Informed Consent: I have reviewed the patients History and Physical, chart, labs and discussed the procedure including the risks, benefits and alternatives for the proposed anesthesia with the patient or authorized representative who has indicated his/her understanding and acceptance.     Dental advisory given  Plan Discussed with: CRNA and Anesthesiologist  Anesthesia Plan Comments:         Anesthesia Quick Evaluation

## 2022-07-21 NOTE — Progress Notes (Signed)
Patient ID: Robert Zhang, male    DOB: [birth year], 77 y.o.   MRN: [id number]   Chief Complaint  Patient presents with   Advice Only    The patient is a 77 year old male here for evaluation of his eyes and face.  He is scheduled for spinal surgery tomorrow at [facility].  He had a visual field exam done and at the [facility] in [place].  It showed visual deficits in his lateral fields.  We are going to try and get those results.  He complains of trouble with his visual field particularly as it gets later in the day.  He finds that he is having to raise his eyebrows or even use his hands to try to see everything he needs to see.  He has tried to tilt his head back but this seems to be getting worse with time.  He is a borderline diabetic and controls it with diet and exercise.  He is 5 feet 8 inches tall weighs 170 pounds.  He also has a cyst on the medial right cheek close to the nose.  This has been there for several weeks and seems to be getting larger.  It is slightly tender.  He quit smoking many years ago.    Review of Systems  Constitutional: Negative.   HENT: Negative.    Eyes: Negative.   Respiratory: Negative.    Cardiovascular: Negative.   Gastrointestinal: Negative.   Endocrine: Negative.   Genitourinary: Negative.   Musculoskeletal:  Positive for neck pain.    Past Medical History:  Diagnosis Date   Aortic atherosclerosis ([place]) 10/12/2017   Noted on CT    Arthritis    bilateral legs /   Neck-Degenerative Disc Disease   Carotid artery occlusion    Right Carotid    Cervical spondylosis    Cervical stenosis of spine    Degenerative disc disease, cervical    C4-7   Diabetes mellitus without complication (HCC)    diet and exercise controlled, no medications, hgb A1C 07/31/2017 (6.9)   Erectile dysfunction 07/31/2020   External hemorrhoids    Hepatic cyst 10/12/2017   Low density lession right hepatic lobe, noted on CT   Hepatitis    History of colon polyps    Hypertension     Leg cramps    Prostate CA ([place])    Protrusion of intervertebral disc of lumbosacral region    L5-S1   PTSD (post-traumatic stress disorder)    PTSD (post-traumatic stress disorder)    Pulmonary nodule 11/26/2016   7m subpleural right lower lobe, noted on CT ABD/PELVIS   Right shoulder tendinitis    Sleep apnea    Stop Bang score of 5   [place]   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    Past Surgical History:  Procedure Laterality Date   CATARACT EXTRACTION W/PHACO  07/20/2011   Procedure: CATARACT EXTRACTION PHACO AND INTRAOCULAR LENS PLACEMENT ([place];  Surgeon: CWilliams Che MD;  Location: [facility];  Service: Ophthalmology;  Laterality: Right;  CDE=25.73   CATARACT EXTRACTION W/PHACO  08/31/2011   Procedure: CATARACT EXTRACTION PHACO AND INTRAOCULAR LENS PLACEMENT (IOC);  Surgeon: CWilliams Che MD;  Location: [facility];  Service: Ophthalmology;  Laterality: Left;  CDE: 38.59   COLONOSCOPY N/A 09/10/2016   Procedure: COLONOSCOPY;  Surgeon: NRogene Houston MD;  Location[facility];  Service: Endoscopy;  Laterality: N/A;  1200   COLONOSCOPY W/ POLYPECTOMY  2011  [facility]-Dr. Rehman   COLONOSCOPY WITH PROPOFOL N/A 10/15/2021   Procedure: COLONOSCOPY WITH PROPOFOL;  Surgeon: Rogene Houston, MD;  Location: [facility];  Service: Endoscopy;  Laterality: N/A;  1020   cortisone injection Left 01/13/2016   left shoulder   cortisone injection Left 09/21/2017   left shoulder   CYSTOSCOPY  01/20/2018   Procedure: CYSTOSCOPY FLEXIBLE;  Surgeon: Cleon Gustin, MD;  Location: [facility];  Service: Urology;;  no seeds found in bladder   EYE SURGERY     laser surgery to repair retina tare   head reconstruction  1966   due to head injury in war   LIPOMA EXCISION Left 02/01/2019   Procedure: EXCISION LIPOMA LEFT UPPER EXTREMITY;  Surgeon: Aviva Signs, MD;  Location: [facility];  Service: General;  Laterality: Left;   PROSTATE BIOPSY   05/20/2016   RADIOACTIVE SEED IMPLANT N/A 01/20/2018   Procedure: RADIOACTIVE SEED IMPLANT/BRACHYTHERAPY IMPLANT;  Surgeon: Cleon Gustin, MD;  Location: [facility];  Service: Urology;  Laterality: N/A;     88    seeds implanted   REFRACTIVE SURGERY Left 03/05/2017   to resolve cloudiness   SPACE OAR INSTILLATION N/A 01/20/2018   Procedure: SPACE OAR INSTILLATION;  Surgeon: Cleon Gustin, MD;  Location[facility];  Service: Urology;  Laterality: N/A;   TRIGGER FINGER RELEASE        Current Outpatient Medications:    allopurinol (ZYLOPRIM) 100 MG tablet, Take 100 mg by mouth daily., Disp: , Rfl:    amLODipine (NORVASC) 10 MG tablet, Take 10 mg by mouth at bedtime., Disp: , Rfl:    aspirin 81 MG tablet, Take 1 tablet (81 mg total) by mouth daily., Disp: 30 tablet, Rfl:    carboxymethylcellulose (REFRESH TEARS) 0.5 % SOLN, Place 1 drop into both eyes 2 (two) times daily., Disp: , Rfl:    diclofenac Sodium (VOLTAREN) 1 % GEL, Apply 2 g topically daily as needed (joint pain)., Disp: , Rfl:    gabapentin (NEURONTIN) 300 MG capsule, Take 300 mg by mouth 2 (two) times daily., Disp: , Rfl:    Lancets (ONETOUCH ULTRASOFT) lancets, , Disp: , Rfl:    losartan (COZAAR) 100 MG tablet, Take 50 mg by mouth 2 (two) times daily., Disp: , Rfl:    naproxen sodium (ALEVE) 220 MG tablet, Take 220-440 mg by mouth 2 (two) times daily as needed (pain)., Disp: , Rfl:    ONE TOUCH ULTRA TEST test strip, , Disp: , Rfl:    tamsulosin (FLOMAX) 0.4 MG CAPS capsule, Take 1 capsule (0.4 mg total) by mouth daily after supper., Disp: 90 capsule, Rfl: 3   Objective:   Vitals:   07/21/22 1334  BP: (!) 164/84  Pulse: 79  SpO2: 97%    Physical Exam Vitals and nursing note reviewed.  Constitutional:      Appearance: Normal appearance.  HENT:     Head: Atraumatic.   Cardiovascular:     Rate and Rhythm: Normal rate.     Pulses: Normal pulses.  Pulmonary:     Effort:  Pulmonary effort is normal.  Abdominal:     Palpations: Abdomen is soft.  Musculoskeletal:        General: No swelling, tenderness, deformity or signs of injury.  Skin:    General: Skin is warm.     Capillary Refill: Capillary refill takes less than 2 seconds.  Neurological:     Mental Status: He is alert  and oriented to person, place, and time.  Psychiatric:        Mood and Affect: Mood normal.        Behavior: Behavior normal.        Thought Content: Thought content normal.        Judgment: Judgment normal.     Assessment & Plan:  Dermatochalasis of both upper eyelids  Sebaceous cyst  Recommend excision of right cheek sebaceous cyst and bilateral upper lid blepharoplasty.  This should not be done before May because he is having spine surgery this week.  We can look at June or July for his surgery.  I have also requested release of information for his visual field exam through the [facility].  Pictures were obtained of the patient and placed in the chart with the patient's or guardian's permission.   [place], DO

## 2022-07-21 NOTE — Progress Notes (Signed)
PCP - Dr. Sharilyn Sites Cardiologist - Denies  PPM/ICD - Denies  Chest x-ray - n/a EKG - 10/09/2021 Stress Test - denies ECHO - denies Cardiac Cath - denies  Sleep Study - Denies CPAP - Denies  Type II diabetic Fasting Blood Sugar - 110-120 Checks Blood Sugar twice a month   Last dose of GLP1 agonist-  Denies  Blood Thinner Instructions: Denies Aspirin Instructions:Stopped Aspirin 08/14/2022  ERAS Protcol -No, NPO  COVID TEST- Denies   Anesthesia review:   Patient denies shortness of breath, fever, cough and chest pain at PAT appointment   All instructions explained to the patient, with a verbal understanding of the material. Patient agrees to go over the instructions while at home for a better understanding. Patient also instructed to self quarantine after being tested for COVID-19. The opportunity to ask questions was provided.

## 2022-07-22 ENCOUNTER — Ambulatory Visit (HOSPITAL_COMMUNITY): Payer: Medicare Other

## 2022-07-22 ENCOUNTER — Ambulatory Visit (HOSPITAL_COMMUNITY)
Admission: RE | Admit: 2022-07-22 | Discharge: 2022-07-23 | Disposition: A | Discharge status: Home / Self Care | Payer: Medicare Other | Attending: Neurosurgery | Admitting: Neurosurgery

## 2022-07-22 ENCOUNTER — Ambulatory Visit (HOSPITAL_BASED_OUTPATIENT_CLINIC_OR_DEPARTMENT_OTHER): Payer: Medicare Other | Admitting: Anesthesiology

## 2022-07-22 ENCOUNTER — Ambulatory Visit (HOSPITAL_COMMUNITY): Payer: Medicare Other | Admitting: Anesthesiology

## 2022-07-22 ENCOUNTER — Encounter (HOSPITAL_COMMUNITY): Payer: Self-pay | Admitting: Neurosurgery

## 2022-07-22 ENCOUNTER — Other Ambulatory Visit: Payer: Self-pay

## 2022-07-22 ENCOUNTER — Encounter (HOSPITAL_COMMUNITY)
Admission: RE | Disposition: A | Discharge status: Home / Self Care | Payer: Self-pay | Source: Home / Self Care | Attending: Neurosurgery

## 2022-07-22 ENCOUNTER — Telehealth: Payer: Self-pay | Admitting: Plastic Surgery

## 2022-07-22 DIAGNOSIS — Z8673 Personal history of transient ischemic attack (TIA), and cerebral infarction without residual deficits: Secondary | ICD-10-CM | POA: Insufficient documentation

## 2022-07-22 DIAGNOSIS — I10 Essential (primary) hypertension: Secondary | ICD-10-CM | POA: Diagnosis not present

## 2022-07-22 DIAGNOSIS — N99114 Postprocedural urethral stricture, male, unspecified: Secondary | ICD-10-CM | POA: Diagnosis not present

## 2022-07-22 DIAGNOSIS — Z87891 Personal history of nicotine dependence: Secondary | ICD-10-CM | POA: Insufficient documentation

## 2022-07-22 DIAGNOSIS — M5116 Intervertebral disc disorders with radiculopathy, lumbar region: Secondary | ICD-10-CM | POA: Diagnosis not present

## 2022-07-22 DIAGNOSIS — N400 Enlarged prostate without lower urinary tract symptoms: Secondary | ICD-10-CM | POA: Diagnosis not present

## 2022-07-22 DIAGNOSIS — Z8546 Personal history of malignant neoplasm of prostate: Secondary | ICD-10-CM | POA: Diagnosis not present

## 2022-07-22 DIAGNOSIS — M1990 Unspecified osteoarthritis, unspecified site: Secondary | ICD-10-CM | POA: Insufficient documentation

## 2022-07-22 DIAGNOSIS — M4186 Other forms of scoliosis, lumbar region: Secondary | ICD-10-CM | POA: Insufficient documentation

## 2022-07-22 DIAGNOSIS — N35919 Unspecified urethral stricture, male, unspecified site: Secondary | ICD-10-CM

## 2022-07-22 DIAGNOSIS — M5126 Other intervertebral disc displacement, lumbar region: Secondary | ICD-10-CM | POA: Diagnosis not present

## 2022-07-22 DIAGNOSIS — E119 Type 2 diabetes mellitus without complications: Secondary | ICD-10-CM | POA: Insufficient documentation

## 2022-07-22 DIAGNOSIS — G4730 Sleep apnea, unspecified: Secondary | ICD-10-CM | POA: Diagnosis not present

## 2022-07-22 DIAGNOSIS — M48062 Spinal stenosis, lumbar region with neurogenic claudication: Secondary | ICD-10-CM | POA: Diagnosis not present

## 2022-07-22 DIAGNOSIS — Z981 Arthrodesis status: Secondary | ICD-10-CM | POA: Diagnosis not present

## 2022-07-22 HISTORY — PX: CYSTOSCOPY: SHX5120

## 2022-07-22 LAB — GLUCOSE, CAPILLARY
Glucose-Capillary: 142 mg/dL — ABNORMAL HIGH (ref 70–99)
Glucose-Capillary: 136 mg/dL — ABNORMAL HIGH (ref 70–99)
Glucose-Capillary: 152 mg/dL — ABNORMAL HIGH (ref 70–99)
Glucose-Capillary: 286 mg/dL — ABNORMAL HIGH (ref 70–99)
Glucose-Capillary: 186 mg/dL — ABNORMAL HIGH (ref 70–99)

## 2022-07-22 LAB — ABO/RH: ABO/RH(D): A POS

## 2022-07-22 SURGERY — POSTERIOR LUMBAR FUSION 1 LEVEL
Anesthesia: General

## 2022-07-22 MED ORDER — LIDOCAINE 2% (20 MG/ML) 5 ML SYRINGE
INTRAMUSCULAR | Status: DC | PRN
  Administered 2022-07-22: 60 mg via INTRAVENOUS

## 2022-07-22 MED ORDER — VANCOMYCIN HCL IN DEXTROSE 1-5 GM/200ML-% IV SOLN: 1000.0000 mg | Freq: Once | INTRAVENOUS | Status: DC

## 2022-07-22 MED ORDER — BUPIVACAINE LIPOSOME 1.3 % IJ SUSP
INTRAMUSCULAR | Status: DC | PRN
  Administered 2022-07-22: 20 mL

## 2022-07-22 MED ORDER — ACETAMINOPHEN 650 MG RE SUPP: 650.0000 mg | RECTAL | Status: DC | PRN

## 2022-07-22 MED ORDER — ONDANSETRON HCL 4 MG PO TABS: 4.0000 mg | ORAL_TABLET | Freq: Four times a day (QID) | ORAL | Status: DC | PRN

## 2022-07-22 MED ORDER — AMLODIPINE BESYLATE 10 MG PO TABS
10.0000 mg | ORAL_TABLET | Freq: Every day | ORAL | Status: DC
  Administered 2022-07-22: 10 mg via ORAL
  Filled 2022-07-22: qty 1

## 2022-07-22 MED ORDER — FENTANYL CITRATE (PF) 250 MCG/5ML IJ SOLN
INTRAMUSCULAR | Status: AC
  Filled 2022-07-22: qty 5

## 2022-07-22 MED ORDER — CHLORHEXIDINE GLUCONATE 0.12 % MT SOLN
15.0000 mL | Freq: Once | OROMUCOSAL | Status: AC
  Administered 2022-07-22: 15 mL via OROMUCOSAL
  Filled 2022-07-22: qty 15

## 2022-07-22 MED ORDER — ONDANSETRON HCL 4 MG/2ML IJ SOLN
INTRAMUSCULAR | Status: DC | PRN
  Administered 2022-07-22: 4 mg via INTRAVENOUS

## 2022-07-22 MED ORDER — MORPHINE SULFATE (PF) 4 MG/ML IV SOLN: 4.0000 mg | INTRAVENOUS | Status: DC | PRN

## 2022-07-22 MED ORDER — CHLORHEXIDINE GLUCONATE CLOTH 2 % EX PADS: 6.0000 | MEDICATED_PAD | Freq: Once | CUTANEOUS | Status: DC

## 2022-07-22 MED ORDER — SODIUM CHLORIDE 0.9 % IV SOLN: 250.0000 mL | INTRAVENOUS | Status: DC

## 2022-07-22 MED ORDER — CIPROFLOXACIN IN D5W 400 MG/200ML IV SOLN
400.0000 mg | Freq: Once | INTRAVENOUS | Status: AC
  Administered 2022-07-22: 400 mg via INTRAVENOUS
  Filled 2022-07-22 (×2): qty 200

## 2022-07-22 MED ORDER — OXYCODONE HCL 5 MG/5ML PO SOLN: 5.0000 mg | Freq: Once | ORAL | Status: DC | PRN

## 2022-07-22 MED ORDER — ALLOPURINOL 100 MG PO TABS
100.0000 mg | ORAL_TABLET | Freq: Every day | ORAL | Status: DC
  Administered 2022-07-23: 100 mg via ORAL
  Filled 2022-07-22: qty 1

## 2022-07-22 MED ORDER — FENTANYL CITRATE (PF) 100 MCG/2ML IJ SOLN: 25.0000 ug | INTRAMUSCULAR | Status: DC | PRN

## 2022-07-22 MED ORDER — CYCLOBENZAPRINE HCL 10 MG PO TABS
10.0000 mg | ORAL_TABLET | Freq: Three times a day (TID) | ORAL | Status: DC | PRN
  Administered 2022-07-22: 10 mg via ORAL
  Filled 2022-07-22: qty 1

## 2022-07-22 MED ORDER — BUPIVACAINE LIPOSOME 1.3 % IJ SUSP
INTRAMUSCULAR | Status: AC
  Filled 2022-07-22: qty 20

## 2022-07-22 MED ORDER — ACETAMINOPHEN 325 MG PO TABS: 650.0000 mg | ORAL_TABLET | ORAL | Status: DC | PRN

## 2022-07-22 MED ORDER — OXYCODONE HCL 5 MG PO TABS
5.0000 mg | ORAL_TABLET | ORAL | Status: DC | PRN
  Administered 2022-07-22 – 2022-07-23 (×2): 5 mg via ORAL
  Filled 2022-07-22 (×2): qty 1

## 2022-07-22 MED ORDER — PROPOFOL 10 MG/ML IV BOLUS
INTRAVENOUS | Status: AC
  Filled 2022-07-22: qty 20

## 2022-07-22 MED ORDER — POLYVINYL ALCOHOL 1.4 % OP SOLN
1.0000 [drp] | Freq: Two times a day (BID) | OPHTHALMIC | Status: DC
  Administered 2022-07-22 – 2022-07-23 (×2): 1 [drp] via OPHTHALMIC
  Filled 2022-07-22: qty 15

## 2022-07-22 MED ORDER — MENTHOL 3 MG MT LOZG: 1.0000 | LOZENGE | OROMUCOSAL | Status: DC | PRN

## 2022-07-22 MED ORDER — VANCOMYCIN HCL IN DEXTROSE 1-5 GM/200ML-% IV SOLN: 1000.0000 mg | INTRAVENOUS | Status: AC

## 2022-07-22 MED ORDER — THROMBIN 5000 UNITS EX SOLR
OROMUCOSAL | Status: DC | PRN
  Administered 2022-07-22: 5 mL via TOPICAL

## 2022-07-22 MED ORDER — GABAPENTIN 300 MG PO CAPS
300.0000 mg | ORAL_CAPSULE | Freq: Two times a day (BID) | ORAL | Status: DC
  Administered 2022-07-22 – 2022-07-23 (×2): 300 mg via ORAL
  Filled 2022-07-22 (×2): qty 1

## 2022-07-22 MED ORDER — TAMSULOSIN HCL 0.4 MG PO CAPS
0.4000 mg | ORAL_CAPSULE | Freq: Every day | ORAL | Status: DC
  Administered 2022-07-22: 0.4 mg via ORAL
  Filled 2022-07-22: qty 1

## 2022-07-22 MED ORDER — LACTATED RINGERS IV SOLN: INTRAVENOUS | Status: DC

## 2022-07-22 MED ORDER — BACITRACIN ZINC 500 UNIT/GM EX OINT
TOPICAL_OINTMENT | CUTANEOUS | Status: AC
  Filled 2022-07-22: qty 28.35

## 2022-07-22 MED ORDER — SODIUM CHLORIDE 0.9 % IR SOLN
Status: DC | PRN
  Administered 2022-07-22: 3000 mL

## 2022-07-22 MED ORDER — BACITRACIN ZINC 500 UNIT/GM EX OINT
TOPICAL_OINTMENT | CUTANEOUS | Status: DC | PRN
  Administered 2022-07-22: 1 via TOPICAL

## 2022-07-22 MED ORDER — 0.9 % SODIUM CHLORIDE (POUR BTL) OPTIME
TOPICAL | Status: DC | PRN
  Administered 2022-07-22: 1000 mL

## 2022-07-22 MED ORDER — SUGAMMADEX SODIUM 200 MG/2ML IV SOLN
INTRAVENOUS | Status: DC | PRN
  Administered 2022-07-22: 200 mg via INTRAVENOUS

## 2022-07-22 MED ORDER — THROMBIN 5000 UNITS EX SOLR
CUTANEOUS | Status: AC
  Filled 2022-07-22: qty 5000

## 2022-07-22 MED ORDER — LOSARTAN POTASSIUM 50 MG PO TABS
50.0000 mg | ORAL_TABLET | Freq: Two times a day (BID) | ORAL | Status: DC
  Administered 2022-07-22 – 2022-07-23 (×2): 50 mg via ORAL
  Filled 2022-07-22 (×2): qty 1

## 2022-07-22 MED ORDER — SODIUM CHLORIDE 0.9% FLUSH
3.0000 mL | Freq: Two times a day (BID) | INTRAVENOUS | Status: DC
  Administered 2022-07-22: 3 mL via INTRAVENOUS

## 2022-07-22 MED ORDER — ACETAMINOPHEN 500 MG PO TABS
1000.0000 mg | ORAL_TABLET | Freq: Four times a day (QID) | ORAL | Status: DC
  Administered 2022-07-22 – 2022-07-23 (×3): 1000 mg via ORAL
  Filled 2022-07-22 (×3): qty 2

## 2022-07-22 MED ORDER — OXYCODONE HCL 5 MG PO TABS: 5.0000 mg | ORAL_TABLET | Freq: Once | ORAL | Status: DC | PRN

## 2022-07-22 MED ORDER — PHENYLEPHRINE HCL-NACL 20-0.9 MG/250ML-% IV SOLN
INTRAVENOUS | Status: DC | PRN
  Administered 2022-07-22: 20 ug/min via INTRAVENOUS

## 2022-07-22 MED ORDER — FENTANYL CITRATE (PF) 250 MCG/5ML IJ SOLN
INTRAMUSCULAR | Status: DC | PRN
  Administered 2022-07-22: 25 ug via INTRAVENOUS
  Administered 2022-07-22 (×4): 50 ug via INTRAVENOUS
  Administered 2022-07-22: 25 ug via INTRAVENOUS

## 2022-07-22 MED ORDER — PROPOFOL 10 MG/ML IV BOLUS
INTRAVENOUS | Status: DC | PRN
  Administered 2022-07-22: 140 mg via INTRAVENOUS
  Administered 2022-07-22: 40 mg via INTRAVENOUS

## 2022-07-22 MED ORDER — ORAL CARE MOUTH RINSE: 15.0000 mL | Freq: Once | OROMUCOSAL | Status: AC

## 2022-07-22 MED ORDER — DOCUSATE SODIUM 100 MG PO CAPS
100.0000 mg | ORAL_CAPSULE | Freq: Two times a day (BID) | ORAL | Status: DC
  Administered 2022-07-22 – 2022-07-23 (×2): 100 mg via ORAL
  Filled 2022-07-22 (×2): qty 1

## 2022-07-22 MED ORDER — ONDANSETRON HCL 4 MG/2ML IJ SOLN: 4.0000 mg | Freq: Once | INTRAMUSCULAR | Status: DC | PRN

## 2022-07-22 MED ORDER — OXYCODONE HCL 5 MG PO TABS: 10.0000 mg | ORAL_TABLET | ORAL | Status: DC | PRN

## 2022-07-22 MED ORDER — DEXAMETHASONE SODIUM PHOSPHATE 10 MG/ML IJ SOLN
INTRAMUSCULAR | Status: DC | PRN
  Administered 2022-07-22: 5 mg via INTRAVENOUS

## 2022-07-22 MED ORDER — SODIUM CHLORIDE 0.9% FLUSH: 3.0000 mL | INTRAVENOUS | Status: DC | PRN

## 2022-07-22 MED ORDER — BISACODYL 10 MG RE SUPP: 10.0000 mg | Freq: Every day | RECTAL | Status: DC | PRN

## 2022-07-22 MED ORDER — PHENYLEPHRINE 80 MCG/ML (10ML) SYRINGE FOR IV PUSH (FOR BLOOD PRESSURE SUPPORT)
PREFILLED_SYRINGE | INTRAVENOUS | Status: DC | PRN
  Administered 2022-07-22: 160 ug via INTRAVENOUS

## 2022-07-22 MED ORDER — PHENOL 1.4 % MT LIQD: 1.0000 | OROMUCOSAL | Status: DC | PRN

## 2022-07-22 MED ORDER — BUPIVACAINE-EPINEPHRINE (PF) 0.5% -1:200000 IJ SOLN
INTRAMUSCULAR | Status: DC | PRN
  Administered 2022-07-22: 10 mL

## 2022-07-22 MED ORDER — ONDANSETRON HCL 4 MG/2ML IJ SOLN: 4.0000 mg | Freq: Four times a day (QID) | INTRAMUSCULAR | Status: DC | PRN

## 2022-07-22 MED ORDER — VANCOMYCIN HCL IN DEXTROSE 1-5 GM/200ML-% IV SOLN
INTRAVENOUS | Status: AC
  Administered 2022-07-22: 1000 mg via INTRAVENOUS
  Filled 2022-07-22: qty 200

## 2022-07-22 MED ORDER — INSULIN ASPART 100 UNIT/ML IJ SOLN: 0.0000 [IU] | INTRAMUSCULAR | Status: DC | PRN

## 2022-07-22 MED ORDER — EPHEDRINE SULFATE-NACL 50-0.9 MG/10ML-% IV SOSY
PREFILLED_SYRINGE | INTRAVENOUS | Status: DC | PRN
  Administered 2022-07-22 (×2): 5 mg via INTRAVENOUS

## 2022-07-22 MED ORDER — ROCURONIUM BROMIDE 10 MG/ML (PF) SYRINGE
PREFILLED_SYRINGE | INTRAVENOUS | Status: DC | PRN
  Administered 2022-07-22 (×4): 20 mg via INTRAVENOUS
  Administered 2022-07-22: 60 mg via INTRAVENOUS

## 2022-07-22 MED ORDER — BUPIVACAINE-EPINEPHRINE (PF) 0.25% -1:200000 IJ SOLN
INTRAMUSCULAR | Status: AC
  Filled 2022-07-22: qty 30

## 2022-07-22 MED ORDER — ACETAMINOPHEN 500 MG PO TABS
1000.0000 mg | ORAL_TABLET | Freq: Once | ORAL | Status: AC
  Administered 2022-07-22: 1000 mg via ORAL
  Filled 2022-07-22: qty 2

## 2022-07-22 SURGICAL SUPPLY — 64 items
APL SKNCLS STERI-STRIP NONHPOA (GAUZE/BANDAGES/DRESSINGS) ×2
BAG COUNTER SPONGE SURGICOUNT (BAG) ×2 IMPLANT
BAG SPNG CNTER NS LX DISP (BAG) ×4
BASKET BONE COLLECTION (BASKET) ×2 IMPLANT
BENZOIN TINCTURE PRP APPL 2/3 (GAUZE/BANDAGES/DRESSINGS) ×2 IMPLANT
BLADE CLIPPER SURG (BLADE) IMPLANT
BUR MATCHSTICK NEURO 3.0 LAGG (BURR) ×2 IMPLANT
BUR PRECISION FLUTE 6.0 (BURR) ×2 IMPLANT
CAGE ALTERA 10X31X9-13 15D (Cage) IMPLANT
CANISTER SUCT 3000ML PPV (MISCELLANEOUS) ×2 IMPLANT
CAP LOCK DLX THRD (Cap) IMPLANT
CATH COUDE FOLEY 5CC 14FR (CATHETERS) IMPLANT
CNTNR URN SCR LID CUP LEK RST (MISCELLANEOUS) ×2 IMPLANT
CONT SPEC 4OZ STRL OR WHT (MISCELLANEOUS) ×2
COVER BACK TABLE 60X90IN (DRAPES) ×2 IMPLANT
DRAPE C-ARM 42X72 X-RAY (DRAPES) ×4 IMPLANT
DRAPE HALF SHEET 40X57 (DRAPES) ×2 IMPLANT
DRAPE LAPAROTOMY 100X72X124 (DRAPES) ×2 IMPLANT
DRAPE SURG 17X23 STRL (DRAPES) ×8 IMPLANT
DRSG OPSITE POSTOP 4X6 (GAUZE/BANDAGES/DRESSINGS) ×2 IMPLANT
ELECT BLADE 4.0 EZ CLEAN MEGAD (MISCELLANEOUS) ×2
ELECT REM PT RETURN 9FT ADLT (ELECTROSURGICAL) ×2
ELECTRODE BLDE 4.0 EZ CLN MEGD (MISCELLANEOUS) ×2 IMPLANT
ELECTRODE REM PT RTRN 9FT ADLT (ELECTROSURGICAL) ×2 IMPLANT
EVACUATOR 1/8 PVC DRAIN (DRAIN) IMPLANT
GAUZE 4X4 16PLY ~~LOC~~+RFID DBL (SPONGE) ×2 IMPLANT
GLOVE BIO SURGEON STRL SZ 6 (GLOVE) ×2 IMPLANT
GLOVE BIO SURGEON STRL SZ8 (GLOVE) ×4 IMPLANT
GLOVE BIO SURGEON STRL SZ8.5 (GLOVE) ×4 IMPLANT
GLOVE BIOGEL PI IND STRL 6.5 (GLOVE) ×2 IMPLANT
GLOVE EXAM NITRILE XL STR (GLOVE) IMPLANT
GOWN STRL REUS W/ TWL LRG LVL3 (GOWN DISPOSABLE) ×2 IMPLANT
GOWN STRL REUS W/ TWL XL LVL3 (GOWN DISPOSABLE) ×4 IMPLANT
GOWN STRL REUS W/TWL 2XL LVL3 (GOWN DISPOSABLE) IMPLANT
GOWN STRL REUS W/TWL LRG LVL3 (GOWN DISPOSABLE) ×2
GOWN STRL REUS W/TWL XL LVL3 (GOWN DISPOSABLE) ×4
HEMOSTAT POWDER KIT SURGIFOAM (HEMOSTASIS) ×2 IMPLANT
KIT BASIN OR (CUSTOM PROCEDURE TRAY) ×2 IMPLANT
KIT GRAFTMAG DEL NEURO DISP (NEUROSURGERY SUPPLIES) IMPLANT
KIT POSITION SURG JACKSON T1 (MISCELLANEOUS) ×2 IMPLANT
KIT TURNOVER KIT B (KITS) ×2 IMPLANT
NDL HYPO 21X1.5 SAFETY (NEEDLE) IMPLANT
NEEDLE HYPO 21X1.5 SAFETY (NEEDLE) IMPLANT
NEEDLE HYPO 22GX1.5 SAFETY (NEEDLE) ×2 IMPLANT
NS IRRIG 1000ML POUR BTL (IV SOLUTION) ×2 IMPLANT
PACK LAMINECTOMY NEURO (CUSTOM PROCEDURE TRAY) ×2 IMPLANT
PAD ARMBOARD 7.5X6 YLW CONV (MISCELLANEOUS) ×6 IMPLANT
PATTIES SURGICAL .5 X1 (DISPOSABLE) IMPLANT
PUTTY DBM 10CC CALC GRAN (Putty) IMPLANT
ROD CURVED TI 6.35X45 (Rod) IMPLANT
SCREW PA DLX CREO 7.5X50 (Screw) IMPLANT
SPIKE FLUID TRANSFER (MISCELLANEOUS) ×2 IMPLANT
SPONGE NEURO XRAY DETECT 1X3 (DISPOSABLE) IMPLANT
SPONGE SURGIFOAM ABS GEL 100 (HEMOSTASIS) IMPLANT
SPONGE T-LAP 4X18 ~~LOC~~+RFID (SPONGE) IMPLANT
STRIP CLOSURE SKIN 1/2X4 (GAUZE/BANDAGES/DRESSINGS) ×2 IMPLANT
SUT VIC AB 1 CT1 18XBRD ANBCTR (SUTURE) ×4 IMPLANT
SUT VIC AB 1 CT1 8-18 (SUTURE) ×4
SUT VIC AB 2-0 CP2 18 (SUTURE) ×4 IMPLANT
SYR 20ML LL LF (SYRINGE) IMPLANT
TOWEL GREEN STERILE (TOWEL DISPOSABLE) ×2 IMPLANT
TOWEL GREEN STERILE FF (TOWEL DISPOSABLE) ×2 IMPLANT
TRAY FOLEY MTR SLVR 16FR STAT (SET/KITS/TRAYS/PACK) ×2 IMPLANT
WATER STERILE IRR 1000ML POUR (IV SOLUTION) ×2 IMPLANT

## 2022-07-22 NOTE — Op Note (Signed)
OPERATIVE NOTE   Patient Name: Robert Zhang  MRN: [id number]   Date of Procedure: 07/22/22   Preoperative diagnosis:  Difficult Foley catheter BPH History of prostate cancer status post brachytherapy  Postoperative diagnosis:  Urethral stricture, posterior Difficult Foley catheter placement BPH History of prostate cancer status post brachytherapy  Procedure:  Cystoscopy Dilation of urethral stricture and Insertion of Foley catheter (16 F, council)  Attending: Primus Bravo, MD  Anesthesia: General  Estimated blood loss: None  Fluids: Per anesthesia record  Drains: 10F foley, council tip  Specimens: None  Antibiotics: Cipro 400 mg IV  Findings: Tight posterior urethral stricture, dilated to 28F with Heyman dilators  Indications:  28 YOWM with history of prostate cancer s/p brachytherapy seen in OR for difficult foley catheter placement.  He is undergoing a lumbar laminotomy today by Dr. Arnoldo Morale.  Nursing staff unable to place foley.  Urologic consultation requested intra-operatively for foley placement.  No history obtained from patient.  Description of Procedure:  Patient under general anesthesia following his neurosurgical procedure.  The genitalia was prepped and draped in sterile fashion.  Flexible cystoscopy was performed.  The anterior urethra was normal in appearance.  A tight urethral stricture was noted in the the posterior urethra with a false passage inferiorly.  The stricture would not allow passage of the cystoscope.  A guide wire was passed under direct visualization.  The stricture was dilated  from 89F to 28F using Heyman dilators over the guidewire.  The stricture was fairly dense.  Following dilation, cystoscopy was again performed.  The cystoscope passed easily into the bladder.  The prostate was normal in appearance.  The bladder was normal as well.  A 10F council tip catheter was passed over the guidewire and into the bladder.  There was return  of light pink fluid.  10 ml of sterile water was placed into the balloon.  The patient was then extubated and taken to the PACU in stable condition.  Complications: None  Condition: Stable, extubated, transferred to PACU  Plan:  Continue foley x 7 days  May follow-up as outpatient for voiding trial with Dr. Alyson Ingles or Dr. Felipa Eth

## 2022-07-22 NOTE — Telephone Encounter (Signed)
FAXED OV Note to [facility], Per request from [facility] at [place] in [place]. FAX [phone]

## 2022-07-22 NOTE — H&P (Signed)
Subjective: The patient is a 77 year old white male who is complaining of back and left greater than right leg pain consistent with neurogenic claudication.  He has failed medical management and was worked up with lumbar x-rays and lumbar MRI which demonstrated a degenerative scoliosis and spinal stenosis most prominent at L4-5.  I discussed the various treatment options with him.  He has decided to proceed with surgery.  Past Medical History:  Diagnosis Date   Aortic atherosclerosis ([place]) 10/12/2017   Noted on CT    Arthritis    bilateral legs /   Neck-Degenerative Disc Disease   Carotid artery occlusion    Right Carotid    Cervical spondylosis    Cervical stenosis of spine    Degenerative disc disease, cervical    C4-7   Diabetes mellitus without complication (HCC)    diet and exercise controlled, no medications, hgb A1C 07/31/2017 (6.9)   Erectile dysfunction 07/31/2020   External hemorrhoids    Hepatic cyst 10/12/2017   Low density lession right hepatic lobe, noted on CT   Hepatitis    History of colon polyps    Hypertension    Leg cramps    Prostate CA ([place])    Protrusion of intervertebral disc of lumbosacral region    L5-S1   PTSD (post-traumatic stress disorder)    PTSD (post-traumatic stress disorder)    Pulmonary nodule 11/26/2016   47m subpleural right lower lobe, noted on CT ABD/PELVIS   Right shoulder tendinitis    Sleep apnea    Stop Bang score of 5   [place] ([place]   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    Past Surgical History:  Procedure Laterality Date   CATARACT EXTRACTION W/PHACO  07/20/2011   Procedure: CATARACT EXTRACTION PHACO AND INTRAOCULAR LENS PLACEMENT ([place];  Surgeon: CWilliams Che MD;  Location: [facility];  Service: Ophthalmology;  Laterality: Right;  CDE=25.73   CATARACT EXTRACTION W/PHACO  08/31/2011   Procedure: CATARACT EXTRACTION PHACO AND INTRAOCULAR LENS PLACEMENT (IOC);  Surgeon: CWilliams Che MD;  Location: [facility];   Service: Ophthalmology;  Laterality: Left;  CDE: 38.59   COLONOSCOPY N/A 09/10/2016   Procedure: COLONOSCOPY;  Surgeon: NRogene Houston MD;  [facility];  Service: Endoscopy;  Laterality: N/A;  1200   COLONOSCOPY W/ POLYPECTOMY  2011   [facility]-Dr. Rehman   COLONOSCOPY WITH PROPOFOL N/A 10/15/2021   Procedure: COLONOSCOPY WITH PROPOFOL;  Surgeon: RRogene Houston MD;  [facility];  Service: Endoscopy;  Laterality: N/A;  1020   cortisone injection Left 01/13/2016   left shoulder   cortisone injection Left 09/21/2017   left shoulder   CYSTOSCOPY  01/20/2018   Procedure: CYSTOSCOPY FLEXIBLE;  Surgeon: MCleon Gustin MD;  [facility]  Service: Urology;;  no seeds found in bladder   EYE SURGERY     laser surgery to repair retina tare   head reconstruction  1966   due to head injury in war   LIPOMA EXCISION Left 02/01/2019   Procedure: EXCISION LIPOMA LEFT UPPER EXTREMITY;  Surgeon: JAviva Signs MD;  Location: [facility];  Service: General;  Laterality: Left;   PROSTATE BIOPSY  05/20/2016   RADIOACTIVE SEED IMPLANT N/A 01/20/2018   Procedure: RADIOACTIVE SEED IMPLANT/BRACHYTHERAPY IMPLANT;  Surgeon: MCleon Gustin MD;  [facility]  Service: Urology;  Laterality: N/A;     88    seeds implanted   REFRACTIVE SURGERY  Left 03/05/2017   to resolve cloudiness   SPACE OAR INSTILLATION N/A 01/20/2018   Procedure: SPACE OAR INSTILLATION;  Surgeon: Cleon Gustin, MD;  Location: [facility];  Service: Urology;  Laterality: N/A;   TRIGGER FINGER RELEASE      Allergies  Allergen Reactions   Mirabegron     Pt states he doesn't have a reaction to this medication but can not take it because he takes hctz   Statins     Muscle pain   Tadalafil     Dropped blood pressure too low   Zetia [Ezetimibe]     Muscle pain   Penicillins Rash and Other (See Comments)    Childhood allergy Has patient had a  PCN reaction causing immediate rash, facial/tongue/throat swelling, SOB or lightheadedness with hypotension: yes Has patient had a PCN reaction causing severe rash involving mucus membranes or skin necrosis: no Has patient had a PCN reaction that required hospitalization no Has patient had a PCN reaction occurring within the last 10 years: no If all of the above answers are "NO", then may proceed with Cephalosporin use.      Social History   Tobacco Use   Smoking status: Former    Packs/day: 1.00    Years: 13.00    Total pack years: 13.00    Types: Cigarettes    Quit date: 04/17/1976    Years since quitting: 46.2    Passive exposure: Never   Smokeless tobacco: Never  Substance Use Topics   Alcohol use: Not Currently    Alcohol/week: 4.0 - 6.0 standard drinks of alcohol    Types: 4 - 6 Shots of liquor per week    Family History  Problem Relation Age of Onset   Diabetes Mother    Hypertension Father    Heart disease Father        before age 38   Heart attack Father    Pancreatic cancer Brother    Anesthesia problems Neg Hx    Hypotension Neg Hx    Malignant hyperthermia Neg Hx    Pseudochol deficiency Neg Hx    Prior to Admission medications   Medication Sig Start Date End Date Taking? Authorizing Provider  allopurinol (ZYLOPRIM) 100 MG tablet Take 100 mg by mouth daily.   Yes [provider]  amLODipine (NORVASC) 10 MG tablet Take 10 mg by mouth at bedtime.   Yes [provider]  aspirin 81 MG tablet Take 1 tablet (81 mg total) by mouth daily. 09/11/16  Yes Rehman, Mechele Dawley, MD  carboxymethylcellulose (REFRESH TEARS) 0.5 % SOLN Place 1 drop into both eyes 2 (two) times daily.   Yes [provider]  diclofenac Sodium (VOLTAREN) 1 % GEL Apply 2 g topically daily as needed (joint pain). 08/28/21  Yes [provider]  gabapentin (NEURONTIN) 300 MG capsule Take 300 mg by mouth 2 (two) times daily. 06/19/20  Yes [provider]  losartan  (COZAAR) 100 MG tablet Take 50 mg by mouth 2 (two) times daily. 07/08/22  Yes [provider]  naproxen sodium (ALEVE) 220 MG tablet Take 220-440 mg by mouth 2 (two) times daily as needed (pain).   Yes [provider]  tamsulosin (FLOMAX) 0.4 MG CAPS capsule Take 1 capsule (0.4 mg total) by mouth daily after supper. 08/20/21  Yes McKenzie, Candee Furbish, MD  Lancets Glory Rosebush ULTRASOFT) lancets  05/23/17   [provider]  ONE TOUCH ULTRA TEST test strip  05/23/17   [provider]     Review of Systems  Positive ROS: As above  All other systems have been reviewed and were otherwise negative with the exception of those mentioned in the HPI and as above.  Objective: Vital signs in last 24 hours: Temp:  [97.5 F (36.4 C)-97.6 F (36.4 C)] 97.6 F (36.4 C) (03/06 0706) Pulse Rate:  [79-97] 97 (03/06 0706) Resp:  [17-18] 18 (03/06 0706) BP: (159-167)/(73-88) 167/88 (03/06 0706) SpO2:  [97 %-98 %] 98 % (03/06 0706) Weight:  [77.1 kg-77.5 kg] 77.1 kg (03/06 0706) Estimated body mass index is 25.85 kg/m as calculated from the following:   Height as of this encounter: '5\' 8"'$  (1.727 m).   Weight as of this encounter: 77.1 kg.   General Appearance: Alert Head: Normocephalic, without obvious abnormality, atraumatic Eyes: PERRL, conjunctiva/corneas clear, EOM's intact,    Ears: Normal  Throat: Normal  Neck: Supple, Back: unremarkable Lungs: Clear to auscultation bilaterally, respirations unlabored Heart: Regular rate and rhythm, no murmur, rub or gallop Abdomen: Soft, non-tender Extremities: Extremities normal, atraumatic, no cyanosis or edema Skin: unremarkable  NEUROLOGIC:   Mental status: alert and oriented,Motor Exam - grossly normal Sensory Exam - grossly normal Reflexes:  Coordination - grossly normal Gait - grossly normal Balance - grossly normal Cranial Nerves: I: smell Not tested  II: visual acuity  OS: Normal  OD: Normal   II: visual fields  Full to confrontation  II: pupils Equal, round, reactive to light  III,VII: ptosis None  III,IV,VI: extraocular muscles  Full ROM  V: mastication Normal  V: facial light touch sensation  Normal  V,VII: corneal reflex  Present  VII: facial muscle function - upper  Normal  VII: facial muscle function - lower Normal  VIII: hearing Not tested  IX: soft palate elevation  Normal  IX,X: gag reflex Present  XI: trapezius strength  5/5  XI: sternocleidomastoid strength 5/5  XI: neck flexion strength  5/5  XII: tongue strength  Normal    Data Review Lab Results  Component Value Date   WBC 6.0 01/13/2018   HGB 16.5 01/13/2018   HCT 48.5 01/13/2018   MCV 91.7 01/13/2018   PLT 171 01/13/2018   Lab Results  Component Value Date   NA 138 10/09/2021   K 3.6 10/09/2021   CL 104 10/09/2021   CO2 28 10/09/2021   BUN 30 (H) 10/09/2021   CREATININE 1.61 (H) 10/09/2021   GLUCOSE 124 (H) 10/09/2021   Lab Results  Component Value Date   INR 0.95 01/13/2018    Assessment/Plan: Adult degenerative scoliosis, lumbar spinal stenosis, lumbago, lumbar radiculopathy, neurogenic claudication: I have discussed the situation with the patient.  I reviewed his imaging studies with him and pointed out the abnormalities.  We have discussed the various treatment options including surgery.  I have described the surgical treatment option of an L4-5 decompression, instrumentation and fusion.  I have shown him surgical models.  I have given him a surgical pamphlet.  We have discussed the risk, benefits, alternatives, expected postoperative course, and likelihood of achieving our goals with surgery.  I have answered all his questions.  He has decided proceed with surgery.   Ophelia Charter 07/22/2022 9:19 AM

## 2022-07-22 NOTE — Progress Notes (Signed)
Orthopedic Tech Progress Note Patient Details:  Robert Zhang [birth year] [id number]  Ortho Devices Type of Ortho Device: Lumbar corsett Ortho Device/Splint Interventions: Ordered   Post Interventions Patient Tolerated: Well Instructions Provided: Adjustment of device, Care of device  [place] 07/22/2022, 5:45 PM

## 2022-07-22 NOTE — Op Note (Signed)
Brief history: The patient is a 77 year old white male who has complained of back and left greater than right leg pain consistent with neurogenic claudication.  He has failed medical management.  He was worked up with a lumbar MRI and lumbar x-rays which demonstrated spinal stenosis most prominent at L4-5.  I discussed the various treatment options with him.  He has decided proceed with surgery.  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 Etc. requiring a wide decompression of the nerve roots.);  Left L4-5 transforaminal lumbar interbody fusion with local morselized autograft bone and Zimmer DBM; insertion of interbody prosthesis at L4-5 (globus peek expandable interbody prosthesis); posterior nonsegmental instrumentation from L4 to L5 with globus titanium pedicle screws and rods; posterior lateral arthrodesis at L4-5 with local morselized autograft bone and Zimmer DBM.  Surgeon: Dr. Earle Gell  Asst.: Arnetha Massy, NP  Anesthesia: Gen. endotracheal  Estimated blood loss: 200 cc  Drains: None  Complications: None  Description of procedure: The patient was brought to the operating room by the anesthesia team. General endotracheal anesthesia was induced. The patient was turned to the prone position on the Wilson frame. The patient's lumbosacral region was then prepared with Betadine scrub and Betadine solution. Sterile drapes were applied.  I then  injected the area to be incised with Marcaine with epinephrine solution. I then used the scalpel to make a linear midline incision over the L4-5 interspace. I then used electrocautery to perform a bilateral subperiosteal dissection exposing the spinous process and lamina of L4-5. We then obtained intraoperative radiograph to confirm our location. We then inserted the Verstrac retractor to provide exposure.  I began the decompression by using the high speed drill to perform laminotomies at L4-5 bilaterally. We then used the Kerrison punches to widen the laminotomy and removed the ligamentum flavum at L4-5 bilaterally. We used the Kerrison punches to remove the medial facets at L4-5 bilaterally, we removed the left L4-5 facet. We performed wide foraminotomies about the bilateral L4 and L5 nerve roots completing the decompression.  We now turned our attention to the posterior lumbar interbody fusion. I used a scalpel to incise the intervertebral disc at L4-5 bilaterally. I then performed a partial intervertebral discectomy at L4-5 bilaterally using the pituitary forceps. We prepared the vertebral endplates at 075-GRM bilaterally for the fusion by removing the soft tissues with the curettes. We then used the trial spacers to pick the appropriate sized interbody prosthesis. We prefilled his prosthesis with a combination of local morselized autograft bone that we obtained during the decompression as well as Zimmer DBM. We inserted the prefilled prosthesis into the interspace at L4-5 from the left, we then turned and expanded the prosthesis. There was a good snug fit of the prosthesis in the interspace. We then filled and the remainder of the intervertebral disc space with local morselized autograft bone and Zimmer DBM. This completed the posterior lumbar interbody arthrodesis.  During the decompression and insertion of the prosthesis the assistant protected the thecal sac and nerve roots with the D'Errico  retractor.  We now turned attention to the  instrumentation. Under fluoroscopic guidance we cannulated the bilateral L4 and L5 pedicles with the bone probe. We then removed the bone probe. We then tapped the pedicle with a 6.5 millimeter tap. We then removed the tap. We probed inside the tapped pedicle with a ball probe to rule out cortical breaches. We then inserted a 7.5 x 50 millimeter pedicle screw into the L4 and L5 pedicles bilaterally under fluoroscopic guidance. We then palpated along the medial aspect of the pedicles to rule out cortical breaches. There were none. The nerve roots were not injured. We then connected the unilateral pedicle screws with a lordotic rod. We compressed the construct and secured the rod in place with the caps. We then tightened the caps appropriately. This completed the instrumentation from L4-5.  We now turned our attention to the posterior lateral arthrodesis at L4-5. We used the high-speed drill to decorticate the remainder of the facets, pars, transverse process at L4-5. We then applied a combination of local morselized autograft bone and Zimmer DBM over these decorticated posterior lateral structures. This completed the posterior lateral arthrodesis.  We then obtained hemostasis using bipolar electrocautery. We irrigated the wound out with saline solution. We inspected the thecal sac and nerve roots and noted they were well decompressed. We then removed the retractor.  We injected Exparel . We reapproximated patient's thoracolumbar fascia with interrupted #1 Vicryl suture. We reapproximated patient's subcutaneous tissue with interrupted 2-0 Vicryl suture. The reapproximated patient's skin with Steri-Strips and benzoin. The wound was then coated with bacitracin ointment. A sterile dressing was applied. The drapes were removed. The patient was subsequently returned to the supine position where they were extubated by the anesthesia team. He was then transported to the post  anesthesia care unit in stable condition. All sponge instrument and needle counts were reportedly correct at the end of this case.

## 2022-07-22 NOTE — OR Nursing (Signed)
Attempted to place 35f foley and attempted twice to place 115fCoude Tip Catheter. Notified Dr. JeArnoldo Moralehat I was unable to place a foley catheter. Dr. JeArnoldo Moralettempted to place 1452foude tip Catheter. Procedure done without catheter per Dr. JenArnoldo Moraleders.

## 2022-07-22 NOTE — Anesthesia Procedure Notes (Signed)
Procedure Name: Intubation Date/Time: 07/22/2022 9:38 AM  Performed by: Thelma Comp, CRNAPre-anesthesia Checklist: Patient identified, Emergency Drugs available, Suction available and Patient being monitored Patient Re-evaluated:Patient Re-evaluated prior to induction Oxygen Delivery Method: Circle System Utilized Preoxygenation: Pre-oxygenation with 100% oxygen Induction Type: IV induction Ventilation: Mask ventilation without difficulty Laryngoscope Size: Mac and 4 Grade View: Grade I Tube type: Oral Tube size: 7.5 mm Number of attempts: 1 Airway Equipment and Method: Stylet Placement Confirmation: ETT inserted through vocal cords under direct vision, positive ETCO2 and breath sounds checked- equal and bilateral Secured at: 21 cm Tube secured with: Tape Dental Injury: Teeth and Oropharynx as per pre-operative assessment

## 2022-07-22 NOTE — Transfer of Care (Signed)
Immediate Anesthesia Transfer of Care Note  Patient: Robert Zhang  Procedure(s) Performed: Posterior Lumbar Interbody Fusion,Interbody Prosthesis,Posterior Instrumentation, Lumbar Four-Five CYSTOSCOPY FLEXIBLE, FOLEY INSERTION, DILATION OF URETHRAL STRICTURE  Patient Location: PACU  Anesthesia Type:General  Level of Consciousness: drowsy and patient cooperative  Airway & Oxygen Therapy: Patient Spontanous Breathing  Post-op Assessment: Report given to RN and Post -op Vital signs reviewed and stable  Post vital signs: Reviewed and stable  Last Vitals:  Vitals Value Taken Time  BP 153/78 07/22/22 1331  Temp    Pulse 93 07/22/22 1336  Resp 14 07/22/22 1336  SpO2 93 % 07/22/22 1336  Vitals shown include unvalidated device data.  Last Pain:  Vitals:   07/22/22 0729  TempSrc:   PainSc: 5       Patients Stated Pain Goal: 3 ([id number])  Complications: No notable events documented.

## 2022-07-22 NOTE — Anesthesia Postprocedure Evaluation (Signed)
Anesthesia Post Note  Patient: Claudia Pollock  Procedure(s) Performed: Posterior Lumbar Interbody Fusion,Interbody Prosthesis,Posterior Instrumentation, Lumbar Four-Five CYSTOSCOPY FLEXIBLE, FOLEY INSERTION, DILATION OF URETHRAL STRICTURE     Patient location during evaluation: PACU Anesthesia Type: General Level of consciousness: awake and alert Pain management: pain level controlled Vital Signs Assessment: post-procedure vital signs reviewed and stable Respiratory status: spontaneous breathing, nonlabored ventilation and respiratory function stable Cardiovascular status: stable and blood pressure returned to baseline Anesthetic complications: no   No notable events documented.  Last Vitals:  Vitals:   07/22/22 1445 07/22/22 1518  BP: (!) 187/99 (!) 171/92  Pulse: 98 (!) 101  Resp: 16 18  Temp: (!) 36.4 C   SpO2: 96% 98%    Last Pain:  Vitals:   07/22/22 1445  TempSrc:   PainSc: 0-No pain                 Audry Pili

## 2022-07-23 DIAGNOSIS — M5116 Intervertebral disc disorders with radiculopathy, lumbar region: Secondary | ICD-10-CM | POA: Diagnosis not present

## 2022-07-23 DIAGNOSIS — M48062 Spinal stenosis, lumbar region with neurogenic claudication: Secondary | ICD-10-CM | POA: Diagnosis not present

## 2022-07-23 DIAGNOSIS — G4730 Sleep apnea, unspecified: Secondary | ICD-10-CM | POA: Diagnosis not present

## 2022-07-23 DIAGNOSIS — Z87891 Personal history of nicotine dependence: Secondary | ICD-10-CM | POA: Diagnosis not present

## 2022-07-23 DIAGNOSIS — Z8673 Personal history of transient ischemic attack (TIA), and cerebral infarction without residual deficits: Secondary | ICD-10-CM | POA: Diagnosis not present

## 2022-07-23 DIAGNOSIS — M4186 Other forms of scoliosis, lumbar region: Secondary | ICD-10-CM | POA: Diagnosis not present

## 2022-07-23 DIAGNOSIS — E119 Type 2 diabetes mellitus without complications: Secondary | ICD-10-CM | POA: Diagnosis not present

## 2022-07-23 DIAGNOSIS — M1990 Unspecified osteoarthritis, unspecified site: Secondary | ICD-10-CM | POA: Diagnosis not present

## 2022-07-23 DIAGNOSIS — N35919 Unspecified urethral stricture, male, unspecified site: Secondary | ICD-10-CM | POA: Diagnosis not present

## 2022-07-23 DIAGNOSIS — I10 Essential (primary) hypertension: Secondary | ICD-10-CM | POA: Diagnosis not present

## 2022-07-23 LAB — GLUCOSE, CAPILLARY: Glucose-Capillary: 149 mg/dL — ABNORMAL HIGH (ref 70–99)

## 2022-07-23 MED ORDER — OXYCODONE-ACETAMINOPHEN 5-325 MG PO TABS: 1.0000 | ORAL_TABLET | ORAL | 0 refills | Status: DC | PRN

## 2022-07-23 MED ORDER — DOCUSATE SODIUM 100 MG PO CAPS: 100.0000 mg | ORAL_CAPSULE | Freq: Two times a day (BID) | ORAL | 0 refills | Status: DC

## 2022-07-23 NOTE — Evaluation (Signed)
Occupational Therapy Evaluation and Discharge Patient Details Name: Robert Zhang MRN: [id number] DOB: [birth year] Today's Date: 07/23/2022   History of Present Illness This 77 yo male s/p bilateral L4-5 laminotomy/foraminotomies/medial facetectomy to decompress the bilateral L4 and L5 nerve roots and left L4-5 transforaminal lumbar interbody fusion due to spinal stenosis most prominent at L4-5.   Clinical Impression   This 77 yo male admitted and underwent above presents to acute OT with all education completed, we will D/C from acute OT.      Recommendations for follow up therapy are one component of a multi-disciplinary discharge planning process, led by the attending physician.  Recommendations may be updated based on patient status, additional functional criteria and insurance authorization.   Follow Up Recommendations  No OT follow up     Assistance Recommended at Discharge PRN  Patient can return home with the following Assistance with cooking/housework;Assist for transportation    Functional Status Assessment  Patient has had a recent decline in their functional status and demonstrates the ability to make significant improvements in function in a reasonable and predictable amount of time. (without further need for skilled OT, all education completed)  Equipment Recommendations  None recommended by OT       Precautions / Restrictions Precautions Precautions: Back Precaution Booklet Issued: Yes (comment) Required Braces or Orthoses: Spinal Brace Spinal Brace: Thoracolumbosacral orthotic;Applied in sitting position Restrictions Weight Bearing Restrictions: No      Mobility Bed Mobility Overal bed mobility: Needs Assistance Bed Mobility: Rolling, Sidelying to Sit Rolling: Supervision Sidelying to sit: Supervision       General bed mobility comments: VCs for sequencing    Transfers Overall transfer level: Modified independent Equipment used: Straight cane                       Balance Overall balance assessment: Mild deficits observed, not formally tested                                         ADL either performed or assessed with clinical judgement   ADL Overall ADL's : Modified independent                                       General ADL Comments: Educated on sequence of dressing, using 2 cups to brush teeth to avoid bending over the sink, use wet wipes for back peri care to get cleaner more quickly so don't twist as much, do not sit for more than 45 minutes per orders, bed mobility, placement of pillows when in bed, sitting posture, sit<>stand stance that helps keep back more straight.     Vision Patient Visual Report: No change from baseline              Pertinent Vitals/Pain Pain Assessment Pain Assessment: 0-10 Pain Score: 2  Pain Location: incisional Pain Descriptors / Indicators: Sore Pain Intervention(s): Limited activity within patient's tolerance, Monitored during session     Hand Dominance Right   Extremity/Trunk Assessment Upper Extremity Assessment Upper Extremity Assessment: Overall WFL for tasks assessed           Communication Communication Communication: No difficulties   Cognition Arousal/Alertness: Awake/alert Behavior During Therapy: WFL for tasks assessed/performed Overall Cognitive Status: Within Functional Limits for tasks assessed  Did have  to intermittently remind him not to bend while getting dressed.                                                Home Living Family/patient expects to be discharged to:: Private residence Living Arrangements: Spouse/significant other Available Help at Discharge: Family;Available 24 hours/day Type of Home: House Home Access: Stairs to enter [employer] of Steps: 2 Entrance Stairs-Rails: Right Home Layout: One level     Bathroom Shower/Tub: Occupational psychologist: Standard      Home Equipment: Cane - single point          Prior Functioning/Environment Prior Level of Function : Independent/Modified Independent                        OT Problem List: Decreased range of motion;Impaired balance (sitting and/or standing);Pain         OT Goals(Current goals can be found in the care plan section) Acute Rehab OT Goals Patient Stated Goal: to go home today         AM-PAC OT "6 Clicks" Daily Activity     Outcome Measure Help from another person eating meals?: None Help from another person taking care of personal grooming?: None Help from another person toileting, which includes using toliet, bedpan, or urinal?: None Help from another person bathing (including washing, rinsing, drying)?: None Help from another person to put on and taking off regular upper body clothing?: None Help from another person to put on and taking off regular lower body clothing?: None 6 Click Score: 24   End of Session Equipment Utilized During Treatment: Back brace [facility]) Nurse Communication: Mobility status (no further OT needs)  Activity Tolerance: Patient tolerated treatment well Patient left: in chair;with call bell/phone within reach  OT Visit Diagnosis: Unsteadiness on feet (R26.81);Pain Pain - part of body:  (incisioanl)                Time: [id number] OT Time Calculation (min): 36 min Charges:  OT General Charges $OT Visit: 1 Visit OT Evaluation $OT Eval Moderate Complexity: 1 Mod OT Treatments $Self Care/Home Management : 8-22 mins  [place] Office [phone]    Almon Register 07/23/2022, 9:06 AM

## 2022-07-23 NOTE — Evaluation (Signed)
Physical Therapy Evaluation Patient Details Name: Robert Zhang MRN: [id number] DOB: 1946/05/09 Today's Date: 07/23/2022  History of Present Illness  This 77 yo male s/p bilateral L4-5 laminotomy/foraminotomies/medial facetectomy to decompress the bilateral L4 and L5 nerve roots and left L4-5 transforaminal lumbar interbody fusion due to spinal stenosis most prominent at L4-5.   Clinical Impression  Pt admitted with above diagnosis. At the time of PT eval, pt was able to demonstrate transfers and ambulation with up to min guard assist and SPC for support. Pt was educated on precautions, brace application/wearing schedule, appropriate activity progression, and car transfer. Pt currently with functional limitations due to the deficits listed below (see PT Problem List). Pt will benefit from skilled PT to increase their independence and safety with mobility to allow discharge to the venue listed below.         Recommendations for follow up therapy are one component of a multi-disciplinary discharge planning process, led by the attending physician.  Recommendations may be updated based on patient status, additional functional criteria and insurance authorization.  Follow Up Recommendations No PT follow up      Assistance Recommended at Discharge PRN  Patient can return home with the following  A little help with walking and/or transfers;A little help with bathing/dressing/bathroom;Assistance with cooking/housework;Assist for transportation;Help with stairs or ramp for entrance    Equipment Recommendations None recommended by PT  Recommendations for Other Services       Functional Status Assessment Patient has had a recent decline in their functional status and demonstrates the ability to make significant improvements in function in a reasonable and predictable amount of time.     Precautions / Restrictions Precautions Precautions: Back Precaution Booklet Issued: Yes (comment) Required  Braces or Orthoses: Spinal Brace Spinal Brace: Lumbar corset;Applied in sitting position Restrictions Weight Bearing Restrictions: No      Mobility  Bed Mobility               General bed mobility comments: Reviewed verbally. Pt received sitting up EOB.    Transfers Overall transfer level: Modified independent Equipment used: Straight cane               General transfer comment: No assist required. Pt declined wearing brace    Ambulation/Gait Ambulation/Gait assistance: Supervision Gait Distance (Feet): 300 Feet Assistive device: Straight cane Gait Pattern/deviations: Step-through pattern, Decreased stride length, Trunk flexed Gait velocity: Decreased Gait velocity interpretation: 1.31 - 2.62 ft/sec, indicative of limited community ambulator   General Gait Details: VC's for improved posture and forward gaze. Cues throughout for optimal maintnenance of precautions.  Stairs Stairs: Yes Stairs assistance: Min guard Stair Management: Forwards, One rail Left, With cane Number of Stairs: 10 General stair comments: VC's for sequencing and general safety. Pt declined wearing brace  Wheelchair Mobility    Modified Rankin (Stroke Patients Only)       Balance Overall balance assessment: Mild deficits observed, not formally tested                                           Pertinent Vitals/Pain Pain Assessment Pain Assessment: Faces Faces Pain Scale: Hurts little more Pain Location: incisional Pain Descriptors / Indicators: Sore Pain Intervention(s): Limited activity within patient's tolerance, Monitored during session, Repositioned    Home Living Family/patient expects to be discharged to:: Private residence Living Arrangements: Spouse/significant other Available Help at Discharge:  Family;Available 24 hours/day Type of Home: House Home Access: Stairs to enter Entrance Stairs-Rails: Right Entrance Stairs-Number of Steps: 2   Home  Layout: One level Home Equipment: Cane - single point      Prior Function Prior Level of Function : Independent/Modified Independent                     Hand Dominance   Dominant Hand: Right    Extremity/Trunk Assessment   Upper Extremity Assessment Upper Extremity Assessment: Overall WFL for tasks assessed    Lower Extremity Assessment Lower Extremity Assessment: Generalized weakness    Cervical / Trunk Assessment Cervical / Trunk Assessment: Back Surgery  Communication   Communication: No difficulties  Cognition Arousal/Alertness: Awake/alert Behavior During Therapy: WFL for tasks assessed/performed Overall Cognitive Status: Within Functional Limits for tasks assessed                                          General Comments      Exercises     Assessment/Plan    PT Assessment Patient needs continued PT services  PT Problem List Decreased strength;Decreased range of motion;Decreased activity tolerance;Decreased balance;Decreased mobility;Decreased knowledge of use of DME;Decreased safety awareness;Decreased knowledge of precautions;Pain       PT Treatment Interventions DME instruction;Gait training;Stair training;Functional mobility training;Therapeutic activities;Therapeutic exercise;Balance training;Patient/family education    PT Goals (Current goals can be found in the Care Plan section)  Acute Rehab PT Goals Patient Stated Goal: Home today, walk to his shop outside PT Goal Formulation: With patient Time For Goal Achievement: 07/30/22 Potential to Achieve Goals: Good    Frequency Min 5X/week     Co-evaluation               AM-PAC PT "6 Clicks" Mobility  Outcome Measure Help needed turning from your back to your side while in a flat bed without using bedrails?: A Little Help needed moving from lying on your back to sitting on the side of a flat bed without using bedrails?: A Little Help needed moving to and from a bed to  a chair (including a wheelchair)?: A Little Help needed standing up from a chair using your arms (e.g., wheelchair or bedside chair)?: A Little Help needed to walk in hospital room?: A Little Help needed climbing 3-5 steps with a railing? : A Little 6 Click Score: 18    End of Session Equipment Utilized During Treatment: Gait belt Activity Tolerance: Patient tolerated treatment well Patient left: in bed;with call bell/phone within reach;with family/visitor present Nurse Communication: Mobility status PT Visit Diagnosis: Unsteadiness on feet (R26.81);Pain Pain - part of body:  (back)    Time: [id number] PT Time Calculation (min) (ACUTE ONLY): 9 min   Charges:   PT Evaluation $PT Eval Low Complexity: 1 Low          Rolinda Roan, PT, DPT Acute Rehabilitation Services Secure Chat Preferred Office: [phone]   Thelma Comp 07/23/2022, 11:01 AM

## 2022-07-23 NOTE — Progress Notes (Signed)
Patient alert and oriented, mae's well, voiding adequate amount of urine, swallowing without difficulty, no c/o pain at time of discharge. Patient discharged home with family. Script and discharged instructions given to patient. Patient and family stated understanding of instructions given. Patient has an appointment with Dr. Jenkins   

## 2022-07-23 NOTE — Discharge Instructions (Signed)

## 2022-07-23 NOTE — Discharge Summary (Signed)
Physician Discharge Summary     Providing Compassionate, Quality Care - Together   Patient ID: Robert Zhang MRN: [id number] DOB/AGE: February 19, 1946 77 y.o.  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 Arnoldo Morale on 07/22/2022. While under general anesthesia, Dr. Felipa Eth of Urology, dilated a urethral stricture and placed an indwelling catheter due to the patient's urinary retention. He was admitted to 3C09 following recovery from anesthesia in the PACU. His postoperative course has been uncomplicated. He has worked with both physical and occupational therapies who feel the patient is ready for discharge home. He is ambulating independently and without difficulty. He is tolerating a normal diet. He is not having any bowel or bladder dysfunction. His pain is well-controlled with oral pain medication. He is ready for discharge home.   Consults: urology, PT/OT/TOC  Significant Diagnostic Studies: radiology: DG Lumbar Spine 2-3 Views  Result Date: 07/22/2022 CLINICAL DATA:  L4-L5 posterior fusion EXAM: LUMBAR SPINE - 2 VIEW COMPARISON:  Same day lumbar spine radiograph obtained at 10:20 a.m. FINDINGS: Fluoroscopic images were obtained intraoperatively and submitted for post operative interpretation. L4-L5 posterior fusion with hardware in expected position, 2 images were obtained with 19 seconds of fluoroscopy time and 12.8 mGy. Please see the performing provider's procedural report for further detail. IMPRESSION: Intraoperative fluoroscopic guidance for L4-L5 posterior fusion. Electronically Signed   By: Yetta Glassman M.D.   On: 07/22/2022 14:41   DG C-Arm 1-60  Min-No Report  Result Date: 07/22/2022 Fluoroscopy was utilized by the requesting physician.  No radiographic interpretation.   DG Lumbar Spine 1 View  Result Date: 07/22/2022 CLINICAL DATA:  Surgery, elective EXAM: LUMBAR SPINE - 1 VIEW COMPARISON:  MRI of the lumbar spine September 09, 2021. FINDINGS: Single cross-table localizing radiograph. Surgical probe projects posteriorly at the superior L5 level. Similar alignment and similar vertebral body heights. IMPRESSION: Single cross-table localizing radiograph. Electronically Signed   By: Margaretha Sheffield M.D.   On: 07/22/2022 12:12     Treatments: surgery: Bilateral L4-5 laminotomy/foraminotomies/medial facetectomy to decompress the bilateral L4 and L5 nerve roots(the work required to do this was in addition to the work required to do the posterior lumbar interbody fusion because of the patient's spinal stenosis, facet arthropathy. Etc. requiring a wide decompression of the nerve roots.);  Left L4-5 transforaminal lumbar interbody fusion with local morselized autograft bone and Zimmer DBM; insertion of interbody prosthesis at L4-5 (globus peek expandable interbody prosthesis); posterior nonsegmental instrumentation from L4 to L5 with globus titanium pedicle screws and rods; posterior lateral arthrodesis at L4-5 with local morselized autograft bone and Zimmer DBM.   Discharge Exam: Blood pressure 132/64, pulse 93, temperature 97.8 F (36.6 C), temperature source Oral, resp. rate 16, height '5\' 8"'$  (1.727 m), weight 77.1 kg, SpO2 94 %.  Alert and oriented x 4 PERRLA CN II-XII grossly intact MAE, Strength and sensation intact Incision is covered with Honeycomb dressing and Steri Strips; Dressing is clean, dry, and intact   Disposition: Discharge disposition: 01-Home or Self Care       Discharge Instructions     Call MD for:  difficulty breathing, headache or visual disturbances   Complete by: As directed    Call MD for:  hives  Complete by:  As directed    Call MD for:  persistant nausea and vomiting   Complete by: As directed    Call MD for:  redness, tenderness, or signs of infection (pain, swelling, redness, odor or green/yellow discharge around incision site)   Complete by: As directed    Call MD for:  severe uncontrolled pain   Complete by: As directed    Call MD for:  temperature >100.4   Complete by: As directed    Diet - low sodium heart healthy   Complete by: As directed    If the dressing is still on your incision site when you go home, remove it on the third day after your surgery date. Remove dressing if it begins to fall off, or if it is dirty or damaged before the third day.   Complete by: As directed    Increase activity slowly   Complete by: As directed    No wound care   Complete by: As directed       Allergies as of 07/23/2022       Reactions   Mirabegron    Pt states he doesn't have a reaction to this medication but can not take it because he takes hctz   Statins    Muscle pain   Tadalafil    Dropped blood pressure too low   Zetia [ezetimibe]    Muscle pain   Penicillins Rash, Other (See Comments)   Childhood allergy Has patient had a PCN reaction causing immediate rash, facial/tongue/throat swelling, SOB or lightheadedness with hypotension: yes Has patient had a PCN reaction causing severe rash involving mucus membranes or skin necrosis: no Has patient had a PCN reaction that required hospitalization no Has patient had a PCN reaction occurring within the last 10 years: no If all of the above answers are "NO", then may proceed with Cephalosporin use.        Medication List     STOP taking these medications    naproxen sodium 220 MG tablet Commonly known as: ALEVE       TAKE these medications    allopurinol 100 MG tablet Commonly known as: ZYLOPRIM Take 100 mg by mouth daily.   amLODipine 10 MG tablet Commonly known as: NORVASC Take 10 mg by mouth at bedtime.   aspirin 81 MG  tablet Take 1 tablet (81 mg total) by mouth daily.   diclofenac Sodium 1 % Gel Commonly known as: VOLTAREN Apply 2 g topically daily as needed (joint pain).   docusate sodium 100 MG capsule Commonly known as: COLACE Take 1 capsule (100 mg total) by mouth 2 (two) times daily.   gabapentin 300 MG capsule Commonly known as: NEURONTIN Take 300 mg by mouth 2 (two) times daily.   losartan 100 MG tablet Commonly known as: COZAAR Take 50 mg by mouth 2 (two) times daily.   ONE TOUCH ULTRA TEST test strip Generic drug: glucose blood   onetouch ultrasoft lancets   oxyCODONE-acetaminophen 5-325 MG tablet Commonly known as: Percocet Take 1 tablet by mouth every 4 (four) hours as needed for severe pain.   Refresh Tears 0.5 % Soln Generic drug: carboxymethylcellulose Place 1 drop into both eyes 2 (two) times daily.   tamsulosin 0.4 MG Caps capsule Commonly known as: FLOMAX Take 1 capsule (0.4 mg total) by mouth daily after supper.               Discharge Care Instructions  (From admission, onward)  Start     Ordered   07/23/22 0000  If the dressing is still on your incision site when you go home, remove it on the third day after your surgery date. Remove dressing if it begins to fall off, or if it is dirty or damaged before the third day.        07/23/22 1047            Follow-up Information     Newman Pies, MD. Go on 08/18/2022.   Specialty: Neurosurgery Why: First post op appointment with x-rays is on 08/18/2022 at 8:30 AM. Contact information: [place]. [address] [place] [address] [place] [address] [phone]         Alyson Ingles Candee Furbish, MD. Schedule an appointment as soon as possible for a visit.   Specialty: Urology Contact information: [place] [address] [phone]                 Signed: Viona Gilmore, DNP, AGNP-C Nurse Practitioner  [facility] [place] [address] [place], [place], [place] [address] P: [phone]    F: [phone]  07/23/2022, 10:48 AM

## 2022-07-25 ENCOUNTER — Encounter (HOSPITAL_COMMUNITY): Payer: Self-pay | Admitting: Neurosurgery

## 2022-07-29 ENCOUNTER — Ambulatory Visit (INDEPENDENT_AMBULATORY_CARE_PROVIDER_SITE_OTHER): Payer: Medicare Other | Admitting: Urology

## 2022-07-29 DIAGNOSIS — N138 Other obstructive and reflux uropathy: Secondary | ICD-10-CM

## 2022-07-29 DIAGNOSIS — C61 Malignant neoplasm of prostate: Secondary | ICD-10-CM

## 2022-07-29 DIAGNOSIS — N401 Enlarged prostate with lower urinary tract symptoms: Secondary | ICD-10-CM

## 2022-07-29 DIAGNOSIS — R351 Nocturia: Secondary | ICD-10-CM

## 2022-07-29 MED ORDER — CIPROFLOXACIN HCL 500 MG PO TABS
500.0000 mg | ORAL_TABLET | Freq: Once | ORAL | Status: AC
  Administered 2022-07-29: 500 mg via ORAL

## 2022-07-29 NOTE — Progress Notes (Cosign Needed Addendum)
Fill and Pull Catheter Removal  Patient is present today for a catheter removal.  Patient was cleaned and prepped in a sterile fashion 10 ml of sterile water/ saline was instilled into the bladder, patient had multiple bladder spasm during the procedure, when the patient felt the urge to urinate. 10 ml of water was then drained from the balloon.  A 16 FR foley cath was removed from the bladder no complications were noted . Patient was given ciprofloxacin to prevent infection  Patient as then given some time to void on their own.  Patient cannot void  0 ml on their own after some time.  Patient tolerated well.  Performed by: Marisue Brooklyn, CMA  Follow up/ Additional notes: Patient return to office @ 1:30 pm for PVR. Patient was instructed to increase fluid intake.  Pt here today for bladder scan. Bladder was scanned and 92 was visualized.    Performed by Marisue Brooklyn, CMA  Additional follow up N/A

## 2022-07-30 MED FILL — Sodium Chloride IV Soln 0.9%: INTRAVENOUS | Qty: 1000 | Status: AC

## 2022-07-30 MED FILL — Heparin Sodium (Porcine) Inj 1000 Unit/ML: INTRAMUSCULAR | Qty: 30 | Status: AC

## 2022-08-10 ENCOUNTER — Telehealth: Payer: Self-pay | Admitting: Plastic Surgery

## 2022-08-10 NOTE — Telephone Encounter (Signed)
Pt called asking the status of his eye sx. Surgical scheduler is out of office today and I did let him know that. Pt stated that his eye lids are really bothering him.

## 2022-08-14 ENCOUNTER — Other Ambulatory Visit: Payer: Medicare Other

## 2022-08-18 DIAGNOSIS — M48062 Spinal stenosis, lumbar region with neurogenic claudication: Secondary | ICD-10-CM | POA: Diagnosis not present

## 2022-08-19 DIAGNOSIS — E1140 Type 2 diabetes mellitus with diabetic neuropathy, unspecified: Secondary | ICD-10-CM | POA: Diagnosis not present

## 2022-08-19 DIAGNOSIS — B351 Tinea unguium: Secondary | ICD-10-CM | POA: Diagnosis not present

## 2022-08-19 DIAGNOSIS — E0590 Thyrotoxicosis, unspecified without thyrotoxic crisis or storm: Secondary | ICD-10-CM | POA: Diagnosis not present

## 2022-08-19 DIAGNOSIS — I10 Essential (primary) hypertension: Secondary | ICD-10-CM | POA: Diagnosis not present

## 2022-08-19 DIAGNOSIS — M109 Gout, unspecified: Secondary | ICD-10-CM | POA: Diagnosis not present

## 2022-08-19 DIAGNOSIS — Z0001 Encounter for general adult medical examination with abnormal findings: Secondary | ICD-10-CM | POA: Diagnosis not present

## 2022-08-19 DIAGNOSIS — M5136 Other intervertebral disc degeneration, lumbar region: Secondary | ICD-10-CM | POA: Diagnosis not present

## 2022-08-20 ENCOUNTER — Telehealth: Payer: Self-pay | Admitting: Plastic Surgery

## 2022-08-20 NOTE — Telephone Encounter (Signed)
[facility] auth# [id number] for excision of cyst on rt cheek. [place] auth# [id number] for [facility]

## 2022-08-21 ENCOUNTER — Ambulatory Visit: Payer: Medicare Other | Admitting: Urology

## 2022-08-26 DIAGNOSIS — I10 Essential (primary) hypertension: Secondary | ICD-10-CM | POA: Diagnosis not present

## 2022-08-27 ENCOUNTER — Other Ambulatory Visit: Payer: Medicare Other

## 2022-08-27 NOTE — H&P (View-Only) (Signed)
Patient ID: Robert Zhang, male    DOB: 11-30-1945, 77 y.o.   MRN: [id number]  Chief Complaint  Patient presents with   Pre-op Exam      ICD-10-CM   1. Dermatochalasis of both upper eyelids  H02.831    H02.834        History of Present Illness: Robert Zhang is a 77 y.o.  male  with a history of bilateral upper eyelid dermatochalasis and facial cyst.  He presents for preoperative evaluation for upcoming procedure, bilateral upper lid blepharoplasty and excision of right cheek cyst, scheduled for 09/17/2022 with Dr. Ulice Bold.  The patient has not had problems with anesthesia.  Recent PLIF without complication.  He states that he is recovering nicely from his lumbar fusion and is even dancing and ambulating without a cane.  He feels very much prepared to proceed with the surgery, as scheduled, and understands the importance of postoperative ambulation to mitigate risk.  He understands that he is at an increased risk of DVT given his history of prostate cancer.  He denies any personal or family history of blood clots or clotting disorder.  He also denies any history of severe cardiac or pulmonary disease.  He admits to a TIA in the past, but no history of CVA.  He takes aspirin 81 mg prescribed by vascular and vein clinic for right-sided partial carotid artery stenosis.  He also is a diet-controlled type II diabetic, but recent A1c elevated at 7.0%.  He believes he can have that improved with continued healthy eating.  Plan is to hold his aspirin 7 days prior to surgery and he states that he recently had that approved for the lumbar surgery.  Quit smoking 40 years ago, denies use of any continued nicotine containing products.  His prostate cancer was successfully treated with seed radiation, recent PSA well within normal limits.  Endorses allergy to PCN.  His blood pressure was markedly elevated here today at 211/91 and repeat BP was 195/85.  He endorses whitecoat syndrome and states that his BP is  typically well-controlled 150/88 at home.  He understands the importance of meeting with [facility] to discuss today's blood pressure readings and to see if there is a way he can have them better controlled ahead of his upcoming surgery.  Asymptomatic.  He tells me that his wife will assist with his postoperative recovery.  Summary of Previous Visit: Patient was seen for consult by Dr. Ulice Bold on 07/21/2022.  Recommend excision of right cheek sebaceous cyst as well as bilateral upper lid blepharoplasty.  Pictures were obtained and placed in chart.  Decision was made for it to be scheduled in early May to give him time to recover from his spine surgery.  Job: Retired.  PMH Significant for: Patient is s/p posterior lumbar interbody fusion L4-L5 as well as cystoscopy and catheter placement for urethral stricture performed 07/22/2022 in combination by Dr. Lovell Sheehan and Dr. Pete Glatter, T2DM, HTN, prostate cancer, gout, and dermatochalasis of bilateral upper eyelids.   Past Medical History: Allergies: Allergies  Allergen Reactions   Mirabegron     Pt states he doesn't have a reaction to this medication but can not take it because he takes hctz   Statins     Muscle pain   Tadalafil     Dropped blood pressure too low   Zetia [Ezetimibe]     Muscle pain   Penicillins Rash and Other (See Comments)    Childhood allergy Has  patient had a PCN reaction causing immediate rash, facial/tongue/throat swelling, SOB or lightheadedness with hypotension: yes Has patient had a PCN reaction causing severe rash involving mucus membranes or skin necrosis: no Has patient had a PCN reaction that required hospitalization no Has patient had a PCN reaction occurring within the last 10 years: no If all of the above answers are "NO", then may proceed with Cephalosporin use.      Current Medications:  Current Outpatient Medications:    allopurinol (ZYLOPRIM) 100 MG tablet, Take 100 mg by mouth daily., Disp: , Rfl:     amLODipine (NORVASC) 10 MG tablet, Take 10 mg by mouth at bedtime., Disp: , Rfl:    aspirin 81 MG tablet, Take 1 tablet (81 mg total) by mouth daily., Disp: 30 tablet, Rfl:    carboxymethylcellulose (REFRESH TEARS) 0.5 % SOLN, Place 1 drop into both eyes 2 (two) times daily., Disp: , Rfl:    diclofenac Sodium (VOLTAREN) 1 % GEL, Apply 2 g topically daily as needed (joint pain)., Disp: , Rfl:    docusate sodium (COLACE) 100 MG capsule, Take 1 capsule (100 mg total) by mouth 2 (two) times daily., Disp: 10 capsule, Rfl: 0   doxycycline (VIBRA-TABS) 100 MG tablet, Take 1 tablet (100 mg total) by mouth 2 (two) times daily for 5 days., Disp: 10 tablet, Rfl: 0   gabapentin (NEURONTIN) 300 MG capsule, Take 300 mg by mouth 2 (two) times daily., Disp: , Rfl:    Lancets (ONETOUCH ULTRASOFT) lancets, , Disp: , Rfl:    losartan (COZAAR) 100 MG tablet, Take 50 mg by mouth 2 (two) times daily., Disp: , Rfl:    ondansetron (ZOFRAN-ODT) 4 MG disintegrating tablet, Take 1 tablet (4 mg total) by mouth every 8 (eight) hours as needed for nausea or vomiting., Disp: 20 tablet, Rfl: 0   ONE TOUCH ULTRA TEST test strip, , Disp: , Rfl:    oxyCODONE-acetaminophen (PERCOCET) 5-325 MG tablet, Take 1 tablet by mouth every 4 (four) hours as needed for severe pain., Disp: 30 tablet, Rfl: 0   tamsulosin (FLOMAX) 0.4 MG CAPS capsule, Take 1 capsule (0.4 mg total) by mouth daily after supper., Disp: 90 capsule, Rfl: 3  Past Medical Problems: Past Medical History:  Diagnosis Date   Aortic atherosclerosis (HCC) 10/12/2017   Noted on CT    Arthritis    bilateral legs /   Neck-Degenerative Disc Disease   Carotid artery occlusion    Right Carotid    Cervical spondylosis    Cervical stenosis of spine    Degenerative disc disease, cervical    C4-7   Diabetes mellitus without complication (HCC)    diet and exercise controlled, no medications, hgb A1C 07/31/2017 (6.9)   Erectile dysfunction 07/31/2020   External hemorrhoids     Hepatic cyst 10/12/2017   Low density lession right hepatic lobe, noted on CT   Hepatitis    History of colon polyps    Hypertension    Leg cramps    Prostate CA (HCC)    Protrusion of intervertebral disc of lumbosacral region    L5-S1   PTSD (post-traumatic stress disorder)    PTSD (post-traumatic stress disorder)    Pulmonary nodule 11/26/2016   2mm subpleural right lower lobe, noted on CT ABD/PELVIS   Right shoulder tendinitis    Sleep apnea    Stop Bang score of 5   Stroke (HCC) 2016   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    Past  Surgical History: Past Surgical History:  Procedure Laterality Date   CATARACT EXTRACTION W/PHACO  07/20/2011   Procedure: CATARACT EXTRACTION PHACO AND INTRAOCULAR LENS PLACEMENT (IOC);  Surgeon: Susa Simmonds, MD;  Location: [facility];  Service: Ophthalmology;  Laterality: Right;  CDE=25.73   CATARACT EXTRACTION W/PHACO  08/31/2011   Procedure: CATARACT EXTRACTION PHACO AND INTRAOCULAR LENS PLACEMENT (IOC);  Surgeon: Susa Simmonds, MD;  Location: [facility];  Service: Ophthalmology;  Laterality: Left;  CDE: 38.59   COLONOSCOPY N/A 09/10/2016   Procedure: COLONOSCOPY;  Surgeon: Malissa Hippo, MD;  Location: [facility];  Service: Endoscopy;  Laterality: N/A;  1200   COLONOSCOPY W/ POLYPECTOMY  2011   [facility]-Dr. Rehman   COLONOSCOPY WITH PROPOFOL N/A 10/15/2021   Procedure: COLONOSCOPY WITH PROPOFOL;  Surgeon: Malissa Hippo, MD;  Location: [facility];  Service: Endoscopy;  Laterality: N/A;  1020   cortisone injection Left 01/13/2016   left shoulder   cortisone injection Left 09/21/2017   left shoulder   CYSTOSCOPY  01/20/2018   Procedure: CYSTOSCOPY FLEXIBLE;  Surgeon: Malen Gauze, MD;  [facility];  Service: Urology;;  no seeds found in bladder   CYSTOSCOPY  07/22/2022   Procedure: CYSTOSCOPY FLEXIBLE, FOLEY INSERTION, DILATION OF URETHRAL STRICTURE;  Surgeon: Milderd Meager., MD;   Location: [facility];  Service: Urology;;   EYE SURGERY     laser surgery to repair retina tare   head reconstruction  1966   due to head injury in war   LIPOMA EXCISION Left 02/01/2019   Procedure: EXCISION LIPOMA LEFT UPPER EXTREMITY;  Surgeon: Franky Macho, MD;  Location: [facility];  Service: General;  Laterality: Left;   PROSTATE BIOPSY  05/20/2016   RADIOACTIVE SEED IMPLANT N/A 01/20/2018   Procedure: RADIOACTIVE SEED IMPLANT/BRACHYTHERAPY IMPLANT;  Surgeon: Malen Gauze, MD;  Location: [facility];  Service: Urology;  Laterality: N/A;     88    seeds implanted   REFRACTIVE SURGERY Left 03/05/2017   to resolve cloudiness   SPACE OAR INSTILLATION N/A 01/20/2018   Procedure: SPACE OAR INSTILLATION;  Surgeon: Malen Gauze, MD;  [facility];  Service: Urology;  Laterality: N/A;   TRIGGER FINGER RELEASE      Social History: Social History   Socioeconomic History   Marital status: Married    Spouse name: Liborio Nixon   Number of children: Not on file   Years of education: Not on file   Highest education level: Not on file  Occupational History   Occupation: retired  Tobacco Use   Smoking status: Former    Packs/day: 1.00    Years: 13.00    Additional pack years: 0.00    Total pack years: 13.00    Types: Cigarettes    Quit date: 04/17/1976    Years since quitting: 46.3    Passive exposure: Never   Smokeless tobacco: Never  Vaping Use   Vaping Use: Never used  Substance and Sexual Activity   Alcohol use: Not Currently    Alcohol/week: 4.0 - 6.0 standard drinks of alcohol    Types: 4 - 6 Shots of liquor per week   Drug use: Never   Sexual activity: Not Currently  Other Topics Concern   Not on file  Social History Narrative   Not on file   Social Determinants of Health   Financial Resource Strain: Not on file  Food Insecurity: Not on file  Transportation Needs: Not on  file  Physical Activity: Not on file  Stress: Not on file   Social Connections: Not on file  Intimate Partner Violence: Not on file    Family History: Family History  Problem Relation Age of Onset   Diabetes Mother    Hypertension Father    Heart disease Father        before age 75   Heart attack Father    Pancreatic cancer Brother    Anesthesia problems Neg Hx    Hypotension Neg Hx    Malignant hyperthermia Neg Hx    Pseudochol deficiency Neg Hx     Review of Systems: ROS Denies any current or recent chest pain, difficulty breathing, leg swelling, or fevers.  Physical Exam: Vital Signs BP (!) 195/85 (BP Location: Left Arm, Patient Position: Sitting, Cuff Size: Small)   Pulse 77   Wt 168 lb 12.8 oz (76.6 kg)   SpO2 98%   BMI 25.67 kg/m   Physical Exam Constitutional:      General: Not in acute distress.    Appearance: Normal appearance. Not ill-appearing.  HENT:     Head: Normocephalic and atraumatic.  Eyes:     Pupils: Pupils are equal, round. Cardiovascular:     Rate and Rhythm: Normal rate.    Pulses: Normal pulses.  Pulmonary:     Effort: No respiratory distress or increased work of breathing.  Speaks in full sentences. Abdominal:     General: Abdomen is flat. No distension.   Musculoskeletal: Normal range of motion. No lower extremity swelling or edema Skin:    General: Skin is warm and dry.     Findings: No erythema or rash.  Neurological:     Mental Status: Alert and oriented to person, place, and time.  Psychiatric:        Mood and Affect: Mood normal.        Behavior: Behavior normal.    Assessment/Plan: The patient is scheduled for bilateral upper lid blepharoplasty and excision of right cheek cyst with Dr. Ulice Bold.  Risks, benefits, and alternatives of procedure discussed, questions answered and consent obtained.    Smoking Status: Former smoker, quit 35 years ago.  Caprini Score: 8; Risk Factors include: Age, BMI greater than 25, history of cancer, and length of planned surgery. Recommendation  for mechanical and possibly pharmacological prophylaxis.  Will discuss with surgeon and prescribe Lovenox if deemed indicated.  Otherwise, we will encourage early ambulation.   Pictures obtained: 07/21/2022  Post-op Rx sent to pharmacy: Doxycycline x 5 days, Zofran.  He reports that he has residual Vicodin at home from his lumbar surgery that he can take if needed.  Patient was provided with the General Surgical Risk consent document and Pain Medication Agreement prior to their appointment.  They had adequate time to read through the risk consent documents and Pain Medication Agreement. We also discussed them in person together during this preop appointment. All of their questions were answered to their satisfaction.  Recommended calling if they have any further questions.  Risk consent form and Pain Medication Agreement to be scanned into patient's chart.  The risks that can be encountered with and after a blepharoplasty were discussed and include the following but no limited to these:  Asymmetry, dry eyes, lid lag, sensitivity to sun or bright light, difficulty closing your eyes, outward rolling of the eyelid, change in vision, fluid accumulation, firmness of the area, fat necrosis with death of fat tissue, bleeding, infection, delayed healing, anesthesia risks, skin sensation  changes, injury to structures including nerves, blood vessels, and muscles which may be temporary or permanent, hair loss, allergies to tape, suture materials and glues, blood products, topical preparations or injected agents, skin and contour irregularities, skin discoloration and swelling, deep vein thrombosis, cardiac and pulmonary complications, pain, which may persist, persistent pain, recurrence, poor healing of the incision, possible need for revisional surgery or staged procedures. Thiere can also be persistent swelling, poor wound healing, rippling or loose skin, swelling. Any change in weight fluctuations can alter the  outcome.    Electronically signed by: Evelena Leyden, PA-C 08/28/2022 10:21 AM

## 2022-08-27 NOTE — Progress Notes (Signed)
Patient ID: Robert Zhang, male    DOB: 02-14-1946, 77 y.o.   MRN: [id number]  Chief Complaint  Patient presents with   Pre-op Exam      ICD-10-CM   1. Dermatochalasis of both upper eyelids  H02.831    H02.834        History of Present Illness: Robert Zhang is a 77 y.o.  male  with a history of bilateral upper eyelid dermatochalasis and facial cyst.  He presents for preoperative evaluation for upcoming procedure, bilateral upper lid blepharoplasty and excision of right cheek cyst, scheduled for 09/17/2022 with Dr. Ulice Bold.  The patient has not had problems with anesthesia.  Recent PLIF without complication.  He states that he is recovering nicely from his lumbar fusion and is even dancing and ambulating without a cane.  He feels very much prepared to proceed with the surgery, as scheduled, and understands the importance of postoperative ambulation to mitigate risk.  He understands that he is at an increased risk of DVT given his history of prostate cancer.  He denies any personal or family history of blood clots or clotting disorder.  He also denies any history of severe cardiac or pulmonary disease.  He admits to a TIA in the past, but no history of CVA.  He takes aspirin 81 mg prescribed by vascular and vein clinic for right-sided partial carotid artery stenosis.  He also is a diet-controlled type II diabetic, but recent A1c elevated at 7.0%.  He believes he can have that improved with continued healthy eating.  Plan is to hold his aspirin 7 days prior to surgery and he states that he recently had that approved for the lumbar surgery.  Quit smoking 40 years ago, denies use of any continued nicotine containing products.  His prostate cancer was successfully treated with seed radiation, recent PSA well within normal limits.  Endorses allergy to PCN.  His blood pressure was markedly elevated here today at 211/91 and repeat BP was 195/85.  He endorses whitecoat syndrome and states that his BP is  typically well-controlled 150/88 at home.  He understands the importance of meeting with [facility] to discuss today's blood pressure readings and to see if there is a way he can have them better controlled ahead of his upcoming surgery.  Asymptomatic.  He tells me that his wife will assist with his postoperative recovery.  Summary of Previous Visit: Patient was seen for consult by Dr. Ulice Bold on 07/21/2022.  Recommend excision of right cheek sebaceous cyst as well as bilateral upper lid blepharoplasty.  Pictures were obtained and placed in chart.  Decision was made for it to be scheduled in early May to give him time to recover from his spine surgery.  Job: Retired.  PMH Significant for: Patient is s/p posterior lumbar interbody fusion L4-L5 as well as cystoscopy and catheter placement for urethral stricture performed 07/22/2022 in combination by Dr. Lovell Sheehan and Dr. Pete Glatter, T2DM, HTN, prostate cancer, gout, and dermatochalasis of bilateral upper eyelids.   Past Medical History: Allergies: Allergies  Allergen Reactions   Mirabegron     Pt states he doesn't have a reaction to this medication but can not take it because he takes hctz   Statins     Muscle pain   Tadalafil     Dropped blood pressure too low   Zetia [Ezetimibe]     Muscle pain   Penicillins Rash and Other (See Comments)    Childhood allergy Has  patient had a PCN reaction causing immediate rash, facial/tongue/throat swelling, SOB or lightheadedness with hypotension: yes Has patient had a PCN reaction causing severe rash involving mucus membranes or skin necrosis: no Has patient had a PCN reaction that required hospitalization no Has patient had a PCN reaction occurring within the last 10 years: no If all of the above answers are "NO", then may proceed with Cephalosporin use.      Current Medications:  Current Outpatient Medications:    allopurinol (ZYLOPRIM) 100 MG tablet, Take 100 mg by mouth daily., Disp: , Rfl:     amLODipine (NORVASC) 10 MG tablet, Take 10 mg by mouth at bedtime., Disp: , Rfl:    aspirin 81 MG tablet, Take 1 tablet (81 mg total) by mouth daily., Disp: 30 tablet, Rfl:    carboxymethylcellulose (REFRESH TEARS) 0.5 % SOLN, Place 1 drop into both eyes 2 (two) times daily., Disp: , Rfl:    diclofenac Sodium (VOLTAREN) 1 % GEL, Apply 2 g topically daily as needed (joint pain)., Disp: , Rfl:    docusate sodium (COLACE) 100 MG capsule, Take 1 capsule (100 mg total) by mouth 2 (two) times daily., Disp: 10 capsule, Rfl: 0   doxycycline (VIBRA-TABS) 100 MG tablet, Take 1 tablet (100 mg total) by mouth 2 (two) times daily for 5 days., Disp: 10 tablet, Rfl: 0   gabapentin (NEURONTIN) 300 MG capsule, Take 300 mg by mouth 2 (two) times daily., Disp: , Rfl:    Lancets (ONETOUCH ULTRASOFT) lancets, , Disp: , Rfl:    losartan (COZAAR) 100 MG tablet, Take 50 mg by mouth 2 (two) times daily., Disp: , Rfl:    ondansetron (ZOFRAN-ODT) 4 MG disintegrating tablet, Take 1 tablet (4 mg total) by mouth every 8 (eight) hours as needed for nausea or vomiting., Disp: 20 tablet, Rfl: 0   ONE TOUCH ULTRA TEST test strip, , Disp: , Rfl:    oxyCODONE-acetaminophen (PERCOCET) 5-325 MG tablet, Take 1 tablet by mouth every 4 (four) hours as needed for severe pain., Disp: 30 tablet, Rfl: 0   tamsulosin (FLOMAX) 0.4 MG CAPS capsule, Take 1 capsule (0.4 mg total) by mouth daily after supper., Disp: 90 capsule, Rfl: 3  Past Medical Problems: Past Medical History:  Diagnosis Date   Aortic atherosclerosis (HCC) 10/12/2017   Noted on CT    Arthritis    bilateral legs /   Neck-Degenerative Disc Disease   Carotid artery occlusion    Right Carotid    Cervical spondylosis    Cervical stenosis of spine    Degenerative disc disease, cervical    C4-7   Diabetes mellitus without complication (HCC)    diet and exercise controlled, no medications, hgb A1C 07/31/2017 (6.9)   Erectile dysfunction 07/31/2020   External hemorrhoids     Hepatic cyst 10/12/2017   Low density lession right hepatic lobe, noted on CT   Hepatitis    History of colon polyps    Hypertension    Leg cramps    Prostate CA (HCC)    Protrusion of intervertebral disc of lumbosacral region    L5-S1   PTSD (post-traumatic stress disorder)    PTSD (post-traumatic stress disorder)    Pulmonary nodule 11/26/2016   2mm subpleural right lower lobe, noted on CT ABD/PELVIS   Right shoulder tendinitis    Sleep apnea    Stop Bang score of 5   Stroke (HCC) 2016   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    Past  Surgical History: Past Surgical History:  Procedure Laterality Date   CATARACT EXTRACTION W/PHACO  07/20/2011   Procedure: CATARACT EXTRACTION PHACO AND INTRAOCULAR LENS PLACEMENT (IOC);  Surgeon: Susa Simmonds, MD;  Location: [facility];  Service: Ophthalmology;  Laterality: Right;  CDE=25.73   CATARACT EXTRACTION W/PHACO  08/31/2011   Procedure: CATARACT EXTRACTION PHACO AND INTRAOCULAR LENS PLACEMENT (IOC);  Surgeon: Susa Simmonds, MD;  Location: [facility];  Service: Ophthalmology;  Laterality: Left;  CDE: 38.59   COLONOSCOPY N/A 09/10/2016   Procedure: COLONOSCOPY;  Surgeon: Malissa Hippo, MD;  Location: [facility];  Service: Endoscopy;  Laterality: N/A;  1200   COLONOSCOPY W/ POLYPECTOMY  2011   [facility]-Dr. Rehman   COLONOSCOPY WITH PROPOFOL N/A 10/15/2021   Procedure: COLONOSCOPY WITH PROPOFOL;  Surgeon: Malissa Hippo, MD;  Location: [facility];  Service: Endoscopy;  Laterality: N/A;  1020   cortisone injection Left 01/13/2016   left shoulder   cortisone injection Left 09/21/2017   left shoulder   CYSTOSCOPY  01/20/2018   Procedure: CYSTOSCOPY FLEXIBLE;  Surgeon: Malen Gauze, MD;  Location: [facility];  Service: Urology;;  no seeds found in bladder   CYSTOSCOPY  07/22/2022   Procedure: CYSTOSCOPY FLEXIBLE, FOLEY INSERTION, DILATION OF URETHRAL STRICTURE;  Surgeon: Milderd Meager., MD;   Location: [facility];  Service: Urology;;   EYE SURGERY     laser surgery to repair retina tare   head reconstruction  1966   due to head injury in war   LIPOMA EXCISION Left 02/01/2019   Procedure: EXCISION LIPOMA LEFT UPPER EXTREMITY;  Surgeon: Franky Macho, MD;  Location: [facility];  Service: General;  Laterality: Left;   PROSTATE BIOPSY  05/20/2016   RADIOACTIVE SEED IMPLANT N/A 01/20/2018   Procedure: RADIOACTIVE SEED IMPLANT/BRACHYTHERAPY IMPLANT;  Surgeon: Malen Gauze, MD;  Location: [facility];  Service: Urology;  Laterality: N/A;     88    seeds implanted   REFRACTIVE SURGERY Left 03/05/2017   to resolve cloudiness   SPACE OAR INSTILLATION N/A 01/20/2018   Procedure: SPACE OAR INSTILLATION;  Surgeon: Malen Gauze, MD;  Location: [facility];  Service: Urology;  Laterality: N/A;   TRIGGER FINGER RELEASE      Social History: Social History   Socioeconomic History   Marital status: Married    Spouse name: Liborio Nixon   Number of children: Not on file   Years of education: Not on file   Highest education level: Not on file  Occupational History   Occupation: retired  Tobacco Use   Smoking status: Former    Packs/day: 1.00    Years: 13.00    Additional pack years: 0.00    Total pack years: 13.00    Types: Cigarettes    Quit date: 04/17/1976    Years since quitting: 46.3    Passive exposure: Never   Smokeless tobacco: Never  Vaping Use   Vaping Use: Never used  Substance and Sexual Activity   Alcohol use: Not Currently    Alcohol/week: 4.0 - 6.0 standard drinks of alcohol    Types: 4 - 6 Shots of liquor per week   Drug use: Never   Sexual activity: Not Currently  Other Topics Concern   Not on file  Social History Narrative   Not on file   Social Determinants of Health   Financial Resource Strain: Not on file  Food Insecurity: Not on file  Transportation Needs: Not on  file  Physical Activity: Not on file  Stress: Not on file   Social Connections: Not on file  Intimate Partner Violence: Not on file    Family History: Family History  Problem Relation Age of Onset   Diabetes Mother    Hypertension Father    Heart disease Father        before age 66   Heart attack Father    Pancreatic cancer Brother    Anesthesia problems Neg Hx    Hypotension Neg Hx    Malignant hyperthermia Neg Hx    Pseudochol deficiency Neg Hx     Review of Systems: ROS Denies any current or recent chest pain, difficulty breathing, leg swelling, or fevers.  Physical Exam: Vital Signs BP (!) 195/85 (BP Location: Left Arm, Patient Position: Sitting, Cuff Size: Small)   Pulse 77   Wt 168 lb 12.8 oz (76.6 kg)   SpO2 98%   BMI 25.67 kg/m   Physical Exam Constitutional:      General: Not in acute distress.    Appearance: Normal appearance. Not ill-appearing.  HENT:     Head: Normocephalic and atraumatic.  Eyes:     Pupils: Pupils are equal, round. Cardiovascular:     Rate and Rhythm: Normal rate.    Pulses: Normal pulses.  Pulmonary:     Effort: No respiratory distress or increased work of breathing.  Speaks in full sentences. Abdominal:     General: Abdomen is flat. No distension.   Musculoskeletal: Normal range of motion. No lower extremity swelling or edema Skin:    General: Skin is warm and dry.     Findings: No erythema or rash.  Neurological:     Mental Status: Alert and oriented to person, place, and time.  Psychiatric:        Mood and Affect: Mood normal.        Behavior: Behavior normal.    Assessment/Plan: The patient is scheduled for bilateral upper lid blepharoplasty and excision of right cheek cyst with Dr. Ulice Bold.  Risks, benefits, and alternatives of procedure discussed, questions answered and consent obtained.    Smoking Status: Former smoker, quit 35 years ago.  Caprini Score: 8; Risk Factors include: Age, BMI greater than 25, history of cancer, and length of planned surgery. Recommendation  for mechanical and possibly pharmacological prophylaxis.  Will discuss with surgeon and prescribe Lovenox if deemed indicated.  Otherwise, we will encourage early ambulation.   Pictures obtained: 07/21/2022  Post-op Rx sent to pharmacy: Doxycycline x 5 days, Zofran.  He reports that he has residual Vicodin at home from his lumbar surgery that he can take if needed.  Patient was provided with the General Surgical Risk consent document and Pain Medication Agreement prior to their appointment.  They had adequate time to read through the risk consent documents and Pain Medication Agreement. We also discussed them in person together during this preop appointment. All of their questions were answered to their satisfaction.  Recommended calling if they have any further questions.  Risk consent form and Pain Medication Agreement to be scanned into patient's chart.  The risks that can be encountered with and after a blepharoplasty were discussed and include the following but no limited to these:  Asymmetry, dry eyes, lid lag, sensitivity to sun or bright light, difficulty closing your eyes, outward rolling of the eyelid, change in vision, fluid accumulation, firmness of the area, fat necrosis with death of fat tissue, bleeding, infection, delayed healing, anesthesia risks, skin sensation  changes, injury to structures including nerves, blood vessels, and muscles which may be temporary or permanent, hair loss, allergies to tape, suture materials and glues, blood products, topical preparations or injected agents, skin and contour irregularities, skin discoloration and swelling, deep vein thrombosis, cardiac and pulmonary complications, pain, which may persist, persistent pain, recurrence, poor healing of the incision, possible need for revisional surgery or staged procedures. Thiere can also be persistent swelling, poor wound healing, rippling or loose skin, swelling. Any change in weight fluctuations can alter the  outcome.    Electronically signed by: Evelena Leyden, PA-C 08/28/2022 10:21 AM

## 2022-08-28 ENCOUNTER — Telehealth: Payer: Self-pay | Admitting: *Deleted

## 2022-08-28 ENCOUNTER — Ambulatory Visit (INDEPENDENT_AMBULATORY_CARE_PROVIDER_SITE_OTHER): Payer: Non-veteran care | Admitting: Physician Assistant

## 2022-08-28 VITALS — BP 195/85 | HR 77 | Wt 168.8 lb

## 2022-08-28 DIAGNOSIS — H02831 Dermatochalasis of right upper eyelid: Secondary | ICD-10-CM

## 2022-08-28 DIAGNOSIS — H02834 Dermatochalasis of left upper eyelid: Secondary | ICD-10-CM

## 2022-08-28 MED ORDER — ONDANSETRON 4 MG PO TBDP: 4.0000 mg | ORAL_TABLET | Freq: Three times a day (TID) | ORAL | 0 refills | Status: DC | PRN

## 2022-08-28 MED ORDER — DOXYCYCLINE HYCLATE 100 MG PO TABS: 100.0000 mg | ORAL_TABLET | Freq: Two times a day (BID) | ORAL | 0 refills | Status: AC

## 2022-08-28 NOTE — Telephone Encounter (Signed)
Request for surgical clearance faxed to Emilie Rutter, PA-C with fax confirmation received

## 2022-09-01 NOTE — Telephone Encounter (Signed)
Surgical Clearance received from Dr. Coral Else- Pt may hold ASA for 5-7 days before surgery. Message forwarded to Evelena Leyden, PA-C

## 2022-09-03 NOTE — Progress Notes (Signed)
Fax received from [facility] on 08/28/22 for medical clearance/medication hold for bilateral upper lid blepharoplasty and excision of right cheek cyst to be signed by Dr. Myra Gianotti.  Provider signed on 08/31/22, form faxed back to sender on 08/31/22, verified successful, sent to scan center.

## 2022-09-04 ENCOUNTER — Ambulatory Visit: Payer: Medicare Other | Admitting: Urology

## 2022-09-14 ENCOUNTER — Other Ambulatory Visit: Payer: Self-pay

## 2022-09-14 ENCOUNTER — Encounter (HOSPITAL_BASED_OUTPATIENT_CLINIC_OR_DEPARTMENT_OTHER): Payer: Self-pay | Admitting: Plastic Surgery

## 2022-09-17 ENCOUNTER — Encounter (HOSPITAL_BASED_OUTPATIENT_CLINIC_OR_DEPARTMENT_OTHER): Payer: Self-pay | Admitting: Plastic Surgery

## 2022-09-17 ENCOUNTER — Ambulatory Visit (HOSPITAL_BASED_OUTPATIENT_CLINIC_OR_DEPARTMENT_OTHER): Payer: No Typology Code available for payment source | Admitting: Anesthesiology

## 2022-09-17 ENCOUNTER — Ambulatory Visit (HOSPITAL_BASED_OUTPATIENT_CLINIC_OR_DEPARTMENT_OTHER)
Admission: RE | Admit: 2022-09-17 | Discharge: 2022-09-17 | Disposition: A | Discharge status: Home / Self Care | Payer: No Typology Code available for payment source | Attending: Plastic Surgery | Admitting: Plastic Surgery

## 2022-09-17 ENCOUNTER — Encounter (HOSPITAL_BASED_OUTPATIENT_CLINIC_OR_DEPARTMENT_OTHER)
Admission: RE | Disposition: A | Discharge status: Home / Self Care | Payer: Self-pay | Source: Home / Self Care | Attending: Plastic Surgery

## 2022-09-17 DIAGNOSIS — Z87891 Personal history of nicotine dependence: Secondary | ICD-10-CM

## 2022-09-17 DIAGNOSIS — H02831 Dermatochalasis of right upper eyelid: Secondary | ICD-10-CM

## 2022-09-17 DIAGNOSIS — K219 Gastro-esophageal reflux disease without esophagitis: Secondary | ICD-10-CM | POA: Diagnosis not present

## 2022-09-17 DIAGNOSIS — L723 Sebaceous cyst: Secondary | ICD-10-CM

## 2022-09-17 DIAGNOSIS — H0234 Blepharochalasis left upper eyelid: Secondary | ICD-10-CM | POA: Insufficient documentation

## 2022-09-17 DIAGNOSIS — H02839 Dermatochalasis of unspecified eye, unspecified eyelid: Secondary | ICD-10-CM | POA: Insufficient documentation

## 2022-09-17 DIAGNOSIS — M1990 Unspecified osteoarthritis, unspecified site: Secondary | ICD-10-CM | POA: Diagnosis not present

## 2022-09-17 DIAGNOSIS — F4310 Post-traumatic stress disorder, unspecified: Secondary | ICD-10-CM | POA: Diagnosis not present

## 2022-09-17 DIAGNOSIS — H0231 Blepharochalasis right upper eyelid: Secondary | ICD-10-CM | POA: Insufficient documentation

## 2022-09-17 DIAGNOSIS — Z79899 Other long term (current) drug therapy: Secondary | ICD-10-CM | POA: Diagnosis not present

## 2022-09-17 DIAGNOSIS — F419 Anxiety disorder, unspecified: Secondary | ICD-10-CM | POA: Insufficient documentation

## 2022-09-17 DIAGNOSIS — H02834 Dermatochalasis of left upper eyelid: Secondary | ICD-10-CM

## 2022-09-17 DIAGNOSIS — L987 Excessive and redundant skin and subcutaneous tissue: Secondary | ICD-10-CM

## 2022-09-17 DIAGNOSIS — G4730 Sleep apnea, unspecified: Secondary | ICD-10-CM | POA: Insufficient documentation

## 2022-09-17 DIAGNOSIS — I10 Essential (primary) hypertension: Secondary | ICD-10-CM

## 2022-09-17 DIAGNOSIS — C44319 Basal cell carcinoma of skin of other parts of face: Secondary | ICD-10-CM | POA: Diagnosis not present

## 2022-09-17 DIAGNOSIS — E119 Type 2 diabetes mellitus without complications: Secondary | ICD-10-CM | POA: Insufficient documentation

## 2022-09-17 HISTORY — PX: CYST EXCISION: SHX5701

## 2022-09-17 HISTORY — PX: BROW LIFT: SHX178

## 2022-09-17 SURGERY — BLEPHAROPLASTY
Anesthesia: General | Site: Face | Laterality: Right

## 2022-09-17 MED ORDER — BSS IO SOLN
INTRAOCULAR | Status: DC | PRN
  Administered 2022-09-17: 15 mL

## 2022-09-17 MED ORDER — TOBRAMYCIN 0.3 % OP OINT
TOPICAL_OINTMENT | OPHTHALMIC | Status: DC | PRN
  Administered 2022-09-17: 1 via OPHTHALMIC

## 2022-09-17 MED ORDER — PHENYLEPHRINE HCL-NACL 20-0.9 MG/250ML-% IV SOLN
INTRAVENOUS | Status: DC | PRN
  Administered 2022-09-17: 40 ug/min via INTRAVENOUS

## 2022-09-17 MED ORDER — HYDROCODONE-ACETAMINOPHEN 5-325 MG PO TABS: 1.0000 | ORAL_TABLET | Freq: Three times a day (TID) | ORAL | 0 refills | Status: AC | PRN

## 2022-09-17 MED ORDER — OXYCODONE HCL 5 MG PO TABS: 5.0000 mg | ORAL_TABLET | Freq: Once | ORAL | Status: DC | PRN

## 2022-09-17 MED ORDER — CIPROFLOXACIN IN D5W 400 MG/200ML IV SOLN
INTRAVENOUS | Status: AC
  Filled 2022-09-17: qty 200

## 2022-09-17 MED ORDER — SUCCINYLCHOLINE CHLORIDE 200 MG/10ML IV SOSY
PREFILLED_SYRINGE | INTRAVENOUS | Status: AC
  Filled 2022-09-17: qty 10

## 2022-09-17 MED ORDER — PHENYLEPHRINE HCL (PRESSORS) 10 MG/ML IV SOLN
INTRAVENOUS | Status: DC | PRN
  Administered 2022-09-17 (×2): 80 ug via INTRAVENOUS

## 2022-09-17 MED ORDER — ACETAMINOPHEN 325 MG RE SUPP: 650.0000 mg | RECTAL | Status: DC | PRN

## 2022-09-17 MED ORDER — AMISULPRIDE (ANTIEMETIC) 5 MG/2ML IV SOLN: 10.0000 mg | Freq: Once | INTRAVENOUS | Status: DC | PRN

## 2022-09-17 MED ORDER — OXYCODONE HCL 5 MG/5ML PO SOLN: 5.0000 mg | Freq: Once | ORAL | Status: DC | PRN

## 2022-09-17 MED ORDER — ACETAMINOPHEN 325 MG PO TABS: 650.0000 mg | ORAL_TABLET | ORAL | Status: DC | PRN

## 2022-09-17 MED ORDER — MIDAZOLAM HCL 5 MG/5ML IJ SOLN
INTRAMUSCULAR | Status: DC | PRN
  Administered 2022-09-17: 1 mg via INTRAVENOUS

## 2022-09-17 MED ORDER — PROPOFOL 10 MG/ML IV BOLUS
INTRAVENOUS | Status: DC | PRN
  Administered 2022-09-17: 150 mg via INTRAVENOUS

## 2022-09-17 MED ORDER — FENTANYL CITRATE (PF) 100 MCG/2ML IJ SOLN
INTRAMUSCULAR | Status: AC
  Filled 2022-09-17: qty 2

## 2022-09-17 MED ORDER — CHLORHEXIDINE GLUCONATE CLOTH 2 % EX PADS: 6.0000 | MEDICATED_PAD | Freq: Once | CUTANEOUS | Status: DC

## 2022-09-17 MED ORDER — SODIUM CHLORIDE 0.9% FLUSH: 3.0000 mL | Freq: Two times a day (BID) | INTRAVENOUS | Status: DC

## 2022-09-17 MED ORDER — DEXAMETHASONE SODIUM PHOSPHATE 10 MG/ML IJ SOLN
INTRAMUSCULAR | Status: AC
  Filled 2022-09-17: qty 1

## 2022-09-17 MED ORDER — SODIUM CHLORIDE 0.9 % IV SOLN: 250.0000 mL | INTRAVENOUS | Status: DC | PRN

## 2022-09-17 MED ORDER — CIPROFLOXACIN IN D5W 400 MG/200ML IV SOLN
400.0000 mg | INTRAVENOUS | Status: AC
  Administered 2022-09-17: 400 mg via INTRAVENOUS

## 2022-09-17 MED ORDER — ONDANSETRON HCL 4 MG/2ML IJ SOLN
INTRAMUSCULAR | Status: DC | PRN
  Administered 2022-09-17: 4 mg via INTRAVENOUS

## 2022-09-17 MED ORDER — LIDOCAINE HCL (CARDIAC) PF 100 MG/5ML IV SOSY
PREFILLED_SYRINGE | INTRAVENOUS | Status: DC | PRN
  Administered 2022-09-17: 40 mg via INTRAVENOUS

## 2022-09-17 MED ORDER — FENTANYL CITRATE (PF) 100 MCG/2ML IJ SOLN: 25.0000 ug | INTRAMUSCULAR | Status: DC | PRN

## 2022-09-17 MED ORDER — EPHEDRINE 5 MG/ML INJ
INTRAVENOUS | Status: AC
  Filled 2022-09-17: qty 5

## 2022-09-17 MED ORDER — OXYCODONE HCL 5 MG PO TABS: 5.0000 mg | ORAL_TABLET | ORAL | Status: DC | PRN

## 2022-09-17 MED ORDER — LIDOCAINE 2% (20 MG/ML) 5 ML SYRINGE
INTRAMUSCULAR | Status: AC
  Filled 2022-09-17: qty 5

## 2022-09-17 MED ORDER — PHENYLEPHRINE 80 MCG/ML (10ML) SYRINGE FOR IV PUSH (FOR BLOOD PRESSURE SUPPORT)
PREFILLED_SYRINGE | INTRAVENOUS | Status: AC
  Filled 2022-09-17: qty 10

## 2022-09-17 MED ORDER — PROMETHAZINE HCL 25 MG/ML IJ SOLN: 6.2500 mg | INTRAMUSCULAR | Status: DC | PRN

## 2022-09-17 MED ORDER — HYDROMORPHONE HCL 1 MG/ML IJ SOLN: 0.2500 mg | INTRAMUSCULAR | Status: DC | PRN

## 2022-09-17 MED ORDER — LACTATED RINGERS IV SOLN: INTRAVENOUS | Status: DC

## 2022-09-17 MED ORDER — ONDANSETRON HCL 4 MG/2ML IJ SOLN
INTRAMUSCULAR | Status: AC
  Filled 2022-09-17: qty 2

## 2022-09-17 MED ORDER — ATROPINE SULFATE 0.4 MG/ML IV SOLN
INTRAVENOUS | Status: AC
  Filled 2022-09-17: qty 1

## 2022-09-17 MED ORDER — SODIUM CHLORIDE 0.9% FLUSH: 3.0000 mL | INTRAVENOUS | Status: DC | PRN

## 2022-09-17 MED ORDER — FENTANYL CITRATE (PF) 100 MCG/2ML IJ SOLN
INTRAMUSCULAR | Status: DC | PRN
  Administered 2022-09-17: 50 ug via INTRAVENOUS

## 2022-09-17 MED ORDER — MIDAZOLAM HCL 2 MG/2ML IJ SOLN
INTRAMUSCULAR | Status: AC
  Filled 2022-09-17: qty 2

## 2022-09-17 MED ORDER — LIDOCAINE-EPINEPHRINE 1 %-1:100000 IJ SOLN
INTRAMUSCULAR | Status: DC | PRN
  Administered 2022-09-17: 6 mL via INTRAMUSCULAR

## 2022-09-17 MED ORDER — DEXAMETHASONE SODIUM PHOSPHATE 4 MG/ML IJ SOLN
INTRAMUSCULAR | Status: DC | PRN
  Administered 2022-09-17: 5 mg via INTRAVENOUS

## 2022-09-17 SURGICAL SUPPLY — 57 items
ADH SKN CLS APL DERMABOND .7 (GAUZE/BANDAGES/DRESSINGS)
APL SWBSTK 6 STRL LF DISP (MISCELLANEOUS) ×2
APPLICATOR COTTON TIP 6 STRL (MISCELLANEOUS) IMPLANT
APPLICATOR COTTON TIP 6IN STRL (MISCELLANEOUS) ×2
BAND INSRT 18 STRL LF DISP RB (MISCELLANEOUS)
BAND RUBBER #18 3X1/16 STRL (MISCELLANEOUS) IMPLANT
BLADE CLIPPER SURG (BLADE) IMPLANT
BLADE SURG 15 STRL LF DISP TIS (BLADE) ×4 IMPLANT
BLADE SURG 15 STRL SS (BLADE) ×4
BNDG EYE OVAL 2 1/8 X 2 5/8 (GAUZE/BANDAGES/DRESSINGS) ×4 IMPLANT
CANISTER SUCT 1200ML W/VALVE (MISCELLANEOUS) IMPLANT
CORD BIPOLAR FORCEPS 12FT (ELECTRODE) ×2 IMPLANT
COVER BACK TABLE 60X90IN (DRAPES) ×2 IMPLANT
COVER MAYO STAND STRL (DRAPES) ×2 IMPLANT
DERMABOND ADVANCED .7 DNX12 (GAUZE/BANDAGES/DRESSINGS) IMPLANT
DRAPE U-SHAPE 76X120 STRL (DRAPES) ×2 IMPLANT
DRSG TEGADERM 2-3/8X2-3/4 SM (GAUZE/BANDAGES/DRESSINGS) IMPLANT
ELECT COATED BLADE 2.86 ST (ELECTRODE) IMPLANT
ELECT NDL BLADE 2-5/6 (NEEDLE) ×2 IMPLANT
ELECT NEEDLE BLADE 2-5/6 (NEEDLE) ×2 IMPLANT
ELECT REM PT RETURN 9FT ADLT (ELECTROSURGICAL) ×2
ELECT REM PT RETURN 9FT PED (ELECTROSURGICAL)
ELECTRODE REM PT RETRN 9FT PED (ELECTROSURGICAL) IMPLANT
ELECTRODE REM PT RTRN 9FT ADLT (ELECTROSURGICAL) ×2 IMPLANT
GAUZE SPONGE 2X2 STRL 8-PLY (GAUZE/BANDAGES/DRESSINGS) IMPLANT
GAUZE SPONGE 4X4 12PLY STRL LF (GAUZE/BANDAGES/DRESSINGS) IMPLANT
GLOVE BIO SURGEON STRL SZ 6.5 (GLOVE) ×4 IMPLANT
GOWN STRL REUS W/ TWL LRG LVL3 (GOWN DISPOSABLE) ×4 IMPLANT
GOWN STRL REUS W/TWL LRG LVL3 (GOWN DISPOSABLE) ×6
NDL HYPO 27GX1-1/4 (NEEDLE) IMPLANT
NDL HYPO 30GX1 BEV (NEEDLE) ×2 IMPLANT
NEEDLE HYPO 27GX1-1/4 (NEEDLE) ×2 IMPLANT
NEEDLE HYPO 30GX1 BEV (NEEDLE) IMPLANT
NS IRRIG 1000ML POUR BTL (IV SOLUTION) IMPLANT
PACK BASIN DAY SURGERY FS (CUSTOM PROCEDURE TRAY) ×2 IMPLANT
PENCIL SMOKE EVACUATOR (MISCELLANEOUS) ×2 IMPLANT
SHEET MEDIUM DRAPE 40X70 STRL (DRAPES) IMPLANT
SLEEVE SCD COMPRESS KNEE MED (STOCKING) IMPLANT
SPIKE FLUID TRANSFER (MISCELLANEOUS) IMPLANT
STRIP CLOSURE SKIN 1/2X4 (GAUZE/BANDAGES/DRESSINGS) ×2 IMPLANT
STRIP SUTURE WOUND CLOSURE 1/2 (MISCELLANEOUS) IMPLANT
SUCTION FRAZIER HANDLE 10FR (MISCELLANEOUS)
SUCTION TUBE FRAZIER 10FR DISP (MISCELLANEOUS) IMPLANT
SUT CHROMIC 6 0 PS 4 (SUTURE) IMPLANT
SUT MNCRL 6-0 UNDY P1 1X18 (SUTURE) ×2 IMPLANT
SUT MNCRL AB 4-0 PS2 18 (SUTURE) IMPLANT
SUT MON AB 5-0 P3 18 (SUTURE) IMPLANT
SUT NYLON ETHILON 5-0 P-3 1X18 (SUTURE) IMPLANT
SUT PLAIN 5 0 P 3 18 (SUTURE) IMPLANT
SUT VIC AB 4-0 PS2 18 (SUTURE) IMPLANT
SUT VIC AB 5-0 P-3 18X BRD (SUTURE) IMPLANT
SUT VIC AB 5-0 P3 18 (SUTURE)
SYR BULB EAR ULCER 3OZ GRN STR (SYRINGE) IMPLANT
SYR CONTROL 10ML LL (SYRINGE) ×2 IMPLANT
TOWEL GREEN STERILE FF (TOWEL DISPOSABLE) ×4 IMPLANT
TRAY DSU PREP LF (CUSTOM PROCEDURE TRAY) ×2 IMPLANT
TUBE CONNECTING 20X1/4 (TUBING) IMPLANT

## 2022-09-17 NOTE — Op Note (Signed)
Operative Note  Date of operation: 09/17/2022  Patient: Robert Zhang, MRN: [id number], 77 y.o. male.  Date of birth:  June 22, 1945  Location: [facility]  Preoperative Diagnosis:  Redundant Upper Eyelid skin Dermatochalasis Sebaceous cyst of cheek  Postoperative Diagnosis: Same  Procedure:  Bilateral Upper eyelid blepharoplasty Excision of sebaceous cyst right cheek 8 mm  Surgeon: Wayland Denis Naethan Bracewell, DO  Assistant: Burna Forts, PA  Condition: Stable  Complications: None   EBL: Minimal  Disposition: Recovery Room  Indications: To prevent complications and improve visual field function.  Improve quality of life.  The patient presented with concerns regarding heavy upper eyelids with a decrease visual field.  This made it difficult with activities and reading.  It was better with manual elevation of the upper eyelids or extending the neck.  The clinical exam and tests confirmed the need for bilateral upper eyelid blepharoplasty.  Surgical risks and benefits were discussed in detail and are in the chart.  Procedure in detail: Patient was seen on the morning of the procedure after anesthesia evaluation. The patient was seen in holding. All questions were answered.  The patient was then taken to the operating room.  Anesthesia was administered and a time out was called with all information confirmed to be correct.  The patient was prepped and draped with ophthalmic betadine prep. The markings were made and confirmed.  Local anesthetic with epinephrine. This was injected into the upper lids. The local was given 8 minutes to take effect.  Right upper lid: Attention was directed to the right upper lid.  The redundant upper eyelid was measured with 20 mm of upper eyelid skin to remain.  A # 15 blade was used to incise the upper eyelid skin.  A small strip of orbicularis muscle was taken at the crease.  Hemostasis was achieved with electrocautery.  The dissection was  carried down past the orbicularis and expose the fat.  A small amount of fat was cauterized in the medial compartment of the upper lid. The incisions were closed with 6-0 Monocryl as a running subcuticular and several interrupted sutures placed laterally.  Left upper lid:  The redundant upper eyelid was measured with 20 mm of upper eyelid skin to remain.  A # 15 blade was used to incise the upper eyelid skin.  Hemostasis was achieved with electrocautery.  The dissection was carried down past the orbicularis and expose the fat medially.  A small amount of fat was removed. The incisions were closed with 6-0 Chromic as a running subcuticular and several interrupted sutures placed laterally.  The incisions were washed.  The eye was washed with salt balanced saline. Ice was applied.  Right Cheek:  The area was injected with local.  The #15 blade was used to make a 5 mm incision and dissect to the base of the lesion (8 mm).  It looked like a sebaceous cyst with trauma.  Electrocautery was used to obtain hemostasis.  The skin was closed with t 6-0 Chromic.  The patient was allowed to wake up and was taken to the recovery room with no apparent complications. Family was notified at the end of the case.   The advanced practice practitioner (APP) assisted throughout the case.  The APP was essential in retraction and counter traction when needed to make the case progress smoothly.  This retraction and assistance made it possible to see the tissue plans for the procedure.  The assistance was needed for blood control, tissue re-approximation  and assisted with closure of the incision site.

## 2022-09-17 NOTE — Discharge Instructions (Addendum)
  Post Anesthesia Home Care Instructions  Activity: Get plenty of rest for the remainder of the day. A responsible individual must stay with you for 24 hours following the procedure.  For the next 24 hours, DO NOT: -Drive a car -Advertising copywriter -Drink alcoholic beverages -Take any medication unless instructed by your physician -Make any legal decisions or sign important papers.  Meals: Start with liquid foods such as gelatin or soup. Progress to regular foods as tolerated. Avoid greasy, spicy, heavy foods. If nausea and/or vomiting occur, drink only clear liquids until the nausea and/or vomiting subsides. Call your physician if vomiting continues.  Special Instructions/Symptoms: Your throat may feel dry or sore from the anesthesia or the breathing tube placed in your throat during surgery. If this causes discomfort, gargle with warm salt water. The discomfort should disappear within 24 hours.  If you had a scopolamine patch placed behind your ear for the management of post- operative nausea and/or vomiting:  1. The medication in the patch is effective for 72 hours, after which it should be removed.  Wrap patch in a tissue and discard in the trash. Wash hands thoroughly with soap and water. 2. You may remove the patch earlier than 72 hours if you experience unpleasant side effects which may include dry mouth, dizziness or visual disturbances. 3. Avoid touching the patch. Wash your hands with soap and water after contact with the patch.      Call your surgeon if you experience:   1.  Fever over 101.0. 2.  Inability to urinate. 3.  Nausea and/or vomiting. 4.  Extreme swelling or bruising at the surgical site. 5.  Continued bleeding from the incision. 6.  Increased pain, redness or drainage from the incision. 7.  Problems related to your pain medication. 8.  Any problems and/or concerns   Keep head elevated as able. Ice to eyelids as able for today. May shower tomorrow.

## 2022-09-17 NOTE — Anesthesia Postprocedure Evaluation (Signed)
Anesthesia Post Note  Patient: Robert Zhang  Procedure(s) Performed: Bilateral upper lid blepharoplasty and excision of right cheek cyst (Bilateral: Eye) CYST REMOVAL (Right: Face)     Patient location during evaluation: PACU Anesthesia Type: General Level of consciousness: awake and alert Pain management: pain level controlled Vital Signs Assessment: post-procedure vital signs reviewed and stable Respiratory status: spontaneous breathing, nonlabored ventilation and respiratory function stable Cardiovascular status: blood pressure returned to baseline and stable Postop Assessment: no apparent nausea or vomiting Anesthetic complications: no   No notable events documented.  Last Vitals:  Vitals:   09/17/22 1145 09/17/22 1200  BP: (!) 168/79 (!) 121/99  Pulse: 72 74  Resp:    Temp:  (!) 36.2 C  SpO2: 100% 98%    Last Pain:  Vitals:   09/17/22 1200  TempSrc:   PainSc: 0-No pain                 Lowella Curb

## 2022-09-17 NOTE — Interval H&P Note (Signed)
History and Physical Interval Note:  09/17/2022 9:42 AM  Robert Zhang  has presented today for surgery, with the diagnosis of Dermatochalasis of both upper, Sebaceous cyst.  The various methods of treatment have been discussed with the patient and family. After consideration of risks, benefits and other options for treatment, the patient has consented to  Procedure(s): Bilateral upper lid blepharoplasty and excision of right cheek cyst (Bilateral) CYST REMOVAL (Right) as a surgical intervention.  The patient's history has been reviewed, patient examined, no change in status, stable for surgery.  I have reviewed the patient's chart and labs.  Questions were answered to the patient's satisfaction.     Alena Bills Meyah Corle

## 2022-09-17 NOTE — Addendum Note (Signed)
Addended by: Peggye Form on: 09/17/2022 09:43 AM   Modules accepted: Orders

## 2022-09-17 NOTE — Anesthesia Procedure Notes (Signed)
Procedure Name: LMA Insertion Date/Time: 09/17/2022 10:05 AM  Performed by: Ronnette Hila, CRNAPre-anesthesia Checklist: Patient identified, Emergency Drugs available, Suction available and Patient being monitored Patient Re-evaluated:Patient Re-evaluated prior to induction Oxygen Delivery Method: Circle system utilized Preoxygenation: Pre-oxygenation with 100% oxygen Induction Type: IV induction Ventilation: Mask ventilation without difficulty LMA: LMA flexible inserted LMA Size: 4.0 Number of attempts: 1 Airway Equipment and Method: Bite block Placement Confirmation: positive ETCO2 Tube secured with: Tape Dental Injury: Teeth and Oropharynx as per pre-operative assessment

## 2022-09-17 NOTE — Anesthesia Preprocedure Evaluation (Signed)
Anesthesia Evaluation  Patient identified by MRN, date of birth, ID band Patient awake    Reviewed: Allergy & Precautions, NPO status , Patient's Chart, lab work & pertinent test results  History of Anesthesia Complications Negative for: history of anesthetic complications  Airway Mallampati: III   Neck ROM: Full    Dental  (+) Dental Advisory Given, Teeth Intact   Pulmonary sleep apnea , former smoker   Pulmonary exam normal        Cardiovascular hypertension, Pt. on medications Normal cardiovascular exam   '23 Carotid US - 40-59% right ICAS    Neuro/Psych  PSYCHIATRIC DISORDERS Anxiety      PTSD CVA, No Residual Symptoms    GI/Hepatic Neg liver ROS,GERD  Controlled,,  Endo/Other  diabetes, Well Controlled, Type 2    Renal/GU negative Renal ROS     Musculoskeletal  (+) Arthritis , Osteoarthritis,    Abdominal   Peds  Hematology negative hematology ROS (+)   Anesthesia Other Findings   Reproductive/Obstetrics                             Anesthesia Physical Anesthesia Plan  ASA: 3  Anesthesia Plan: General   Post-op Pain Management: Minimal or no pain anticipated   Induction: Intravenous  PONV Risk Score and Plan: 2 and Treatment may vary due to age or medical condition, Ondansetron and Midazolam  Airway Management Planned: LMA  Additional Equipment: None  Intra-op Plan:   Post-operative Plan: Extubation in OR  Informed Consent: I have reviewed the patients History and Physical, chart, labs and discussed the procedure including the risks, benefits and alternatives for the proposed anesthesia with the patient or authorized representative who has indicated his/her understanding and acceptance.     Dental advisory given  Plan Discussed with: CRNA and Anesthesiologist  Anesthesia Plan Comments:         Anesthesia Quick Evaluation

## 2022-09-17 NOTE — Transfer of Care (Signed)
Immediate Anesthesia Transfer of Care Note  Patient: Robert Zhang  Procedure(s) Performed: Bilateral upper lid blepharoplasty and excision of right cheek cyst (Bilateral: Eye) CYST REMOVAL (Right: Face)  Patient Location: PACU  Anesthesia Type:General  Level of Consciousness: sedated  Airway & Oxygen Therapy: Patient Spontanous Breathing and Patient connected to face mask oxygen  Post-op Assessment: Report given to RN and Post -op Vital signs reviewed and stable  Post vital signs: Reviewed and stable  Last Vitals:  Vitals Value Taken Time  BP    Temp    Pulse 80 09/17/22 1116  Resp    SpO2 97 % 09/17/22 1116  Vitals shown include unvalidated device data.  Last Pain:  Vitals:   09/17/22 0811  TempSrc: Oral  PainSc: 0-No pain         Complications: No notable events documented.

## 2022-09-18 ENCOUNTER — Encounter (HOSPITAL_BASED_OUTPATIENT_CLINIC_OR_DEPARTMENT_OTHER): Payer: Self-pay | Admitting: Plastic Surgery

## 2022-09-18 ENCOUNTER — Ambulatory Visit (INDEPENDENT_AMBULATORY_CARE_PROVIDER_SITE_OTHER): Payer: Self-pay | Admitting: Surgical

## 2022-09-18 DIAGNOSIS — Z9889 Other specified postprocedural states: Secondary | ICD-10-CM

## 2022-09-18 DIAGNOSIS — L723 Sebaceous cyst: Secondary | ICD-10-CM

## 2022-09-18 DIAGNOSIS — H02831 Dermatochalasis of right upper eyelid: Secondary | ICD-10-CM

## 2022-09-18 NOTE — Progress Notes (Signed)
Patient is a 77 year old male who underwent bilateral upper eyelid blepharoplasty and excision of right cheek cyst with Dr. Ulice Bold yesterday.  He reports via telephone today that he is doing well, he is not having any issues.  He reports that he has normal vision, he reports she is able to completely close his upper eyelids.  He reports he has been using refresh eyedrops to help with dry eye.  He reports he otherwise feels great, is not having any infectious symptoms or any other symptomatic changes after surgery.  He reports he has some mild bruising around his eyes.  The patient gave consent to have this visit done by telemedicine / virtual visit, two identifiers were used to identify patient. This is also consent for access the chart and treat the patient via this visit. The patient is located in [place].  I, the provider, am at the office.  We spent 4 minutes together for the visit.  Joined by telephone.

## 2022-09-21 LAB — SURGICAL PATHOLOGY

## 2022-09-25 ENCOUNTER — Ambulatory Visit (INDEPENDENT_AMBULATORY_CARE_PROVIDER_SITE_OTHER): Payer: Non-veteran care | Admitting: Plastic Surgery

## 2022-09-25 ENCOUNTER — Encounter: Payer: Self-pay | Admitting: Plastic Surgery

## 2022-09-25 VITALS — BP 158/78 | HR 98

## 2022-09-25 DIAGNOSIS — H02834 Dermatochalasis of left upper eyelid: Secondary | ICD-10-CM

## 2022-09-25 DIAGNOSIS — H02831 Dermatochalasis of right upper eyelid: Secondary | ICD-10-CM

## 2022-09-25 NOTE — Progress Notes (Signed)
The patient is a 77 year old male here for follow-up after undergoing upper lid blepharoplasty.  He also had excision of a lesion on his right cheek.  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  All areas are healing very nicely.  No sign of infection.  Bruising and swelling as expected.  He is aware that we will need to do a reexcision of the cheek.  The sutures were removed from the eyelids.  The patient is very happy with his results.  Pictures were obtained of the patient and placed in the chart with the patient's or guardian's permission.

## 2022-10-05 NOTE — Progress Notes (Unsigned)
Patient is a pleasant 77 year old male s/p bilateral upper eyelid blepharoplasty and right cheek cyst excision performed 09/17/2022 by Dr. Ulice Bold who presents to clinic for postoperative follow-up.  He was last seen here in office on 09/25/2022.  At that time, he was pleased with the results of his blepharoplasty and the sutures were removed without complication.  However, the lesion excised from his right cheek returned as cystic BCC with involved peripheral margins.  Discussed plans for reexcision.  Today,

## 2022-10-06 ENCOUNTER — Encounter: Payer: Self-pay | Admitting: Physician Assistant

## 2022-10-06 ENCOUNTER — Ambulatory Visit (INDEPENDENT_AMBULATORY_CARE_PROVIDER_SITE_OTHER): Payer: Non-veteran care | Admitting: Physician Assistant

## 2022-10-06 VITALS — HR 99

## 2022-10-06 DIAGNOSIS — Z9889 Other specified postprocedural states: Secondary | ICD-10-CM

## 2022-10-19 NOTE — H&P (View-Only) (Signed)
Patient is a pleasant 77 year old male s/p bilateral upper eyelid blepharoplasty and right cheek cyst excision performed 09/17/2022 by Dr. Ulice Bold who presents to clinic for postoperative follow-up.   He was last seen here in clinic on 10/06/2022.  At that time, he felt improved from a blepharoplasty recovery standpoint and his exam was reassuring.  Discussed reexcision and possible closure of the BCC right cheek.  Today, he is doing well.  He states that his scars have healed so nicely from his blepharoplasty that people can even tell he had surgery.  Patient then tells me that he has had dry eye symptoms for which she has been using artificial tears.  When asked when this started, he tells me that it has been ever since he had cataract surgery years ago and it has not improved or worsened since his blepharoplasty.  His PCP at the [facility] has been managing these symptoms, will defer to them for ongoing management.  Do not feel as though it is at all related to his recent blepharoplasty, particular given that he is able to close his eyes without difficulty.  On exam, incisions appear all well-healed.  He can open and close eyes completely without difficulty.  PERRL, EOM intact.  BCC excision site also appears well-healed.  No new erythema or lesions noted.  Patient is now scheduled for right cheek BCC reexcision 11/11/2022.  No further follow-up needed with regard to the blepharoplasty as he is well-healed.  He is quite pleased with the outcome and states that his vision and visual field is improved.   1:28 PM Called patient to connect with him given that his blood pressure was markedly elevated again here in clinic.  This has happened a couple times in the past, as well.  In the past he has told me that it is related to whitecoat syndrome and today he tells me that he had not yet taken his blood pressure medication.  He denies any chest discomfort, nausea, blurred vision, headache, difficulty breathing, or  other symptoms.  I informed him that these numbers are concerning for hypertensive urgency and inquired whether or not his primary care provider is seeing him regularly for blood pressure control.  He confirms that they are and that he believes his blood pressure has come down since taking his antihypertensive.  Recommending close follow-up with PCP.

## 2022-10-19 NOTE — Progress Notes (Unsigned)
Patient is a pleasant 77 year old male s/p bilateral upper eyelid blepharoplasty and right cheek cyst excision performed 09/17/2022 by Dr. Ulice Bold who presents to clinic for postoperative follow-up.   He was last seen here in clinic on 10/06/2022.  At that time, he felt improved from a blepharoplasty recovery standpoint and his exam was reassuring.  Discussed reexcision and possible closure of the BCC right cheek.  Today, he is doing well.  He states that his scars have healed so nicely from his blepharoplasty that people can even tell he had surgery.  Patient then tells me that he has had dry eye symptoms for which she has been using artificial tears.  When asked when this started, he tells me that it has been ever since he had cataract surgery years ago and it has not improved or worsened since his blepharoplasty.  His PCP at the [facility] has been managing these symptoms, will defer to them for ongoing management.  Do not feel as though it is at all related to his recent blepharoplasty, particular given that he is able to close his eyes without difficulty.  On exam, incisions appear all well-healed.  He can open and close eyes completely without difficulty.  PERRL, EOM intact.  BCC excision site also appears well-healed.  No new erythema or lesions noted.  Patient is now scheduled for right cheek BCC reexcision 11/11/2022.  No further follow-up needed with regard to the blepharoplasty as he is well-healed.  He is quite pleased with the outcome and states that his vision and visual field is improved.   1:28 PM Called patient to connect with him given that his blood pressure was markedly elevated again here in clinic.  This has happened a couple times in the past, as well.  In the past he has told me that it is related to whitecoat syndrome and today he tells me that he had not yet taken his blood pressure medication.  He denies any chest discomfort, nausea, blurred vision, headache, difficulty breathing, or  other symptoms.  I informed him that these numbers are concerning for hypertensive urgency and inquired whether or not his primary care provider is seeing him regularly for blood pressure control.  He confirms that they are and that he believes his blood pressure has come down since taking his antihypertensive.  Recommending close follow-up with PCP.

## 2022-10-20 ENCOUNTER — Ambulatory Visit (INDEPENDENT_AMBULATORY_CARE_PROVIDER_SITE_OTHER): Payer: Self-pay | Admitting: Physician Assistant

## 2022-10-20 VITALS — BP 198/101 | HR 82

## 2022-10-20 DIAGNOSIS — Z9889 Other specified postprocedural states: Secondary | ICD-10-CM

## 2022-10-22 NOTE — Progress Notes (Signed)
Patient ID: Robert Zhang, male    DOB: 1946-01-14, 77 y.o.   MRN: [id number]  Chief Complaint  Patient presents with   Pre-op Exam      ICD-10-CM   1. Basal cell carcinoma (BCC) of skin of other part of face  C44.319        History of Present Illness: Robert Zhang is a 77 y.o.  male  with a history of right cheek basal cell carcinoma.  He presents for preoperative evaluation for upcoming procedure, reexcision and possible closure of right cheek BCC, scheduled for 11/11/2022 with Dr.  Ulice Bold .  Patient denies any complication or difficulty with anesthesia.  His history of prostate cancer and recently diagnosed BCC right cheek.  Denies any personal or family history of blood clots or clotting disorder.  Denies any personal history of severe cardiac or pulmonary disease.  He reports history of TIA, but no CVA.  Takes 81 mg aspirin as prescribed by vascular and vein clinic for right-sided partial carotid artery stenosis, reports less than 60%.  We have already received surgical clearance on them to hold for 7 days prior to surgery when he had the blepharoplasty last month.  Diet-controlled T2DM.  Quit smoking over 30 years ago and denies use of any nicotine-containing products.  Allergy to PCN.  Blood pressure today on initial evaluation was 205/98.  He is confident that it will come down as he reports history of whitecoat syndrome.  Prior to leaving for the office today, his blood pressure was 166/89 and that it trends down over the course of the day.  He states that he is followed closely at the [facility] for his variable blood pressures.  He is asymptomatic and without any chest discomfort, difficulty breathing, blurred vision or headache, or other symptoms.  Patient had multiple blood pressures obtained here at his preoperative appointment, first was 205/98 and the second was 218/101.  He tells me that he has his cuff checked regularly at home and it is functioning normally.  He is adamant that his  blood pressure is elevated due to whitecoat syndrome, mild anxiety regarding the upcoming surgery, and suspicion that it is partially due to ingesting a large quantity of salty popcorn last evening.  Patient states that it is multifactorial, but will check it again when he gets home and either way we will call his primary care provider at the [place] to discuss his particularly elevated readings here in the clinic.  He adamantly denies any chest discomfort, palpitations, difficulty breathing, back pain, headache or dizziness, blurred vision or other visual deficits, nausea, weakness, or other symptoms.  Regardless of whether he is asymptomatic, discussed possible evaluation in the ER for further workup for hypertensive urgency versus hypertensive emergency.  He adamantly declines and would prefer to go home.  He states that he has an arrangement with his primary care provider whereby he titrates his losartan based on his readings and simply needs to titrate up.  Patient is ambulating well here in the ED and mentating appropriately.  Patient to call his primary care provider today even if his blood pressures normalized (for him) given that they are poorly controlled.  Summary of Previous Visit: He was last seen here in clinic for postop evaluation subsequent to bilateral upper eyelid blepharoplasty and right cheek lesion excised 09/17/2022 by Dr. Ulice Bold.  Surgical pathology from that day revealed nodular basal cell carcinoma which clinically resembles a cystic lesion.  Peripheral margin appeared involved  and reexcision was recommended.  He was otherwise well-healed from surgery.  Job: Retired.  PMH Significant for: L4-L5 PLIF, blepharoplasty 09/17/2022, partial carotid artery stenosis on ASA 81 mg, T2DM, HTN, prostate cancer treated with radiation, gout.   Past Medical History: Allergies: Allergies  Allergen Reactions   Mirabegron     Pt states he doesn't have a reaction to this medication but can not take  it because he takes hctz   Statins     Muscle pain   Tadalafil     Dropped blood pressure too low   Zetia [Ezetimibe]     Muscle pain   Penicillins Rash and Other (See Comments)    Childhood allergy Has patient had a PCN reaction causing immediate rash, facial/tongue/throat swelling, SOB or lightheadedness with hypotension: yes Has patient had a PCN reaction causing severe rash involving mucus membranes or skin necrosis: no Has patient had a PCN reaction that required hospitalization no Has patient had a PCN reaction occurring within the last 10 years: no If all of the above answers are "NO", then may proceed with Cephalosporin use.      Current Medications:  Current Outpatient Medications:    aspirin 81 MG tablet, Take 1 tablet (81 mg total) by mouth daily., Disp: 30 tablet, Rfl:    carboxymethylcellulose (REFRESH TEARS) 0.5 % SOLN, Place 1 drop into both eyes 2 (two) times daily., Disp: , Rfl:    diclofenac Sodium (VOLTAREN) 1 % GEL, Apply 2 g topically daily as needed (joint pain)., Disp: , Rfl:    gabapentin (NEURONTIN) 300 MG capsule, Take 300 mg by mouth 2 (two) times daily., Disp: , Rfl:    Lancets (ONETOUCH ULTRASOFT) lancets, , Disp: , Rfl:    losartan (COZAAR) 100 MG tablet, Take 50 mg by mouth 2 (two) times daily., Disp: , Rfl:    ONE TOUCH ULTRA TEST test strip, , Disp: , Rfl:    oxyCODONE-acetaminophen (PERCOCET) 5-325 MG tablet, Take 1 tablet by mouth every 4 (four) hours as needed for severe pain., Disp: 30 tablet, Rfl: 0   tamsulosin (FLOMAX) 0.4 MG CAPS capsule, Take 1 capsule (0.4 mg total) by mouth daily after supper., Disp: 90 capsule, Rfl: 3  Past Medical Problems: Past Medical History:  Diagnosis Date   Aortic atherosclerosis (HCC) 10/12/2017   Noted on CT    Arthritis    bilateral legs /   Neck-Degenerative Disc Disease   Carotid artery occlusion    Right Carotid    Cervical spondylosis    Cervical stenosis of spine    Degenerative disc disease,  cervical    C4-7   Diabetes mellitus without complication (HCC)    diet and exercise controlled, no medications, hgb A1C 07/31/2017 (6.9)   Erectile dysfunction 07/31/2020   External hemorrhoids    Hepatic cyst 10/12/2017   Low density lession right hepatic lobe, noted on CT   Hepatitis    History of colon polyps    Hypertension    Leg cramps    Prostate CA (HCC)    Protrusion of intervertebral disc of lumbosacral region    L5-S1   PTSD (post-traumatic stress disorder)    PTSD (post-traumatic stress disorder)    Pulmonary nodule 11/26/2016   2mm subpleural right lower lobe, noted on CT ABD/PELVIS   Right shoulder tendinitis    Sleep apnea    Stop Bang score of 5   Stroke (HCC) 2016   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  Past Surgical History: Past Surgical History:  Procedure Laterality Date   BROW LIFT Bilateral 09/17/2022   Procedure: Bilateral upper lid blepharoplasty and excision of right cheek cyst;  Surgeon: Peggye Form, DO;  Location: [facility];  Service: Plastics;  Laterality: Bilateral;   CATARACT EXTRACTION W/PHACO  07/20/2011   Procedure: CATARACT EXTRACTION PHACO AND INTRAOCULAR LENS PLACEMENT (IOC);  Surgeon: Susa Simmonds, MD;  Location: [facility];  Service: Ophthalmology;  Laterality: Right;  CDE=25.73   CATARACT EXTRACTION W/PHACO  08/31/2011   Procedure: CATARACT EXTRACTION PHACO AND INTRAOCULAR LENS PLACEMENT (IOC);  Surgeon: Susa Simmonds, MD;  Location: [facility];  Service: Ophthalmology;  Laterality: Left;  CDE: 38.59   COLONOSCOPY N/A 09/10/2016   Procedure: COLONOSCOPY;  Surgeon: Malissa Hippo, MD;  Location: [facility];  Service: Endoscopy;  Laterality: N/A;  1200   COLONOSCOPY W/ POLYPECTOMY  2011   [facility]-Dr. Rehman   COLONOSCOPY WITH PROPOFOL N/A 10/15/2021   Procedure: COLONOSCOPY WITH PROPOFOL;  Surgeon: Malissa Hippo, MD;  Location: [facility];  Service: Endoscopy;  Laterality: N/A;  1020    cortisone injection Left 01/13/2016   left shoulder   cortisone injection Left 09/21/2017   left shoulder   CYST EXCISION Right 09/17/2022   Procedure: CYST REMOVAL;  Surgeon: Peggye Form, DO;  Location: [facility];  Service: Plastics;  Laterality: Right;   CYSTOSCOPY  01/20/2018   Procedure: CYSTOSCOPY FLEXIBLE;  Surgeon: Malen Gauze, MD;  Location: [facility];  Service: Urology;;  no seeds found in bladder   CYSTOSCOPY  07/22/2022   Procedure: CYSTOSCOPY FLEXIBLE, FOLEY INSERTION, DILATION OF URETHRAL STRICTURE;  Surgeon: Milderd Meager., MD;  Location: [facility];  Service: Urology;;   EYE SURGERY     laser surgery to repair retina tare   head reconstruction  1966   due to head injury in war   LIPOMA EXCISION Left 02/01/2019   Procedure: EXCISION LIPOMA LEFT UPPER EXTREMITY;  Surgeon: Franky Macho, MD;  Location: [facility];  Service: General;  Laterality: Left;   PROSTATE BIOPSY  05/20/2016   RADIOACTIVE SEED IMPLANT N/A 01/20/2018   Procedure: RADIOACTIVE SEED IMPLANT/BRACHYTHERAPY IMPLANT;  Surgeon: Malen Gauze, MD;  Location: [facility];  Service: Urology;  Laterality: N/A;     88    seeds implanted   REFRACTIVE SURGERY Left 03/05/2017   to resolve cloudiness   SPACE OAR INSTILLATION N/A 01/20/2018   Procedure: SPACE OAR INSTILLATION;  Surgeon: Malen Gauze, MD;  Location: [facility];  Service: Urology;  Laterality: N/A;   TRIGGER FINGER RELEASE      Social History: Social History   Socioeconomic History   Marital status: Married    Spouse name: Liborio Nixon   Number of children: Not on file   Years of education: Not on file   Highest education level: Not on file  Occupational History   Occupation: retired  Tobacco Use   Smoking status: Former    Packs/day: 1.00    Years: 13.00    Additional pack years: 0.00    Total pack years: 13.00    Types: Cigarettes    Quit date: 04/17/1976    Years  since quitting: 46.5    Passive exposure: Never   Smokeless tobacco: Never  Vaping Use   Vaping Use: Never used  Substance and Sexual Activity   Alcohol use: Not Currently    Alcohol/week: 4.0 - 6.0 standard drinks  of alcohol    Types: 4 - 6 Shots of liquor per week   Drug use: Never   Sexual activity: Not Currently  Other Topics Concern   Not on file  Social History Narrative   Not on file   Social Determinants of Health   Financial Resource Strain: Not on file  Food Insecurity: Not on file  Transportation Needs: Not on file  Physical Activity: Not on file  Stress: Not on file  Social Connections: Not on file  Intimate Partner Violence: Not on file    Family History: Family History  Problem Relation Age of Onset   Diabetes Mother    Hypertension Father    Heart disease Father        before age 90   Heart attack Father    Pancreatic cancer Brother    Anesthesia problems Neg Hx    Hypotension Neg Hx    Malignant hyperthermia Neg Hx    Pseudochol deficiency Neg Hx     Review of Systems: ROS Denies any chest discomfort, difficulty breathing, leg swelling, or fevers.  Physical Exam: Vital Signs BP (!) 218/101 (BP Location: Left Arm, Cuff Size: Normal)   Pulse 69   SpO2 100%   Physical Exam Constitutional:      General: Not in acute distress.    Appearance: Normal appearance. Not ill-appearing.  HENT:     Head: Normocephalic and atraumatic.  Eyes:     Pupils: Pupils are equal, round. Cardiovascular:     Rate and Rhythm: Normal rate.    Pulses: Normal pulses.  Pulmonary:     Effort: No respiratory distress or increased work of breathing.  Speaks in full sentences. Abdominal:     General: Abdomen is flat. No distension.   Musculoskeletal: Normal range of motion. No lower extremity swelling or edema. No varicosities. Skin:    General: Skin is warm and dry.     Findings: No erythema or rash.  Neurological:     Mental Status: Alert and oriented to person,  place, and time.  Psychiatric:        Mood and Affect: Mood normal.        Behavior: Behavior normal.    Assessment/Plan: The patient is scheduled for reexcision and possible primary closure right cheek BCC with Dr. Ulice Bold.  Risks, benefits, and alternatives of procedure discussed, questions answered and consent obtained.    Smoking Status: Former smoker, quit 35 years ago.  Caprini Score: 8; Risk Factors include: Age, BMI greater than 25, history of cancer, and length of planned surgery. Recommendation for mechanical and possibly pharmacological prophylaxis.  Will discuss with surgeon and prescribe Lovenox if deemed indicated.  Otherwise, we will encourage early ambulation.   Pictures obtained: 09/25/2022  Post-op Rx sent to pharmacy: Doxycycline x 5 days, Oxycodone. Declines Zofran.  Patient was provided with the General Surgical Risk consent document and Pain Medication Agreement prior to their appointment.  They had adequate time to read through the risk consent documents and Pain Medication Agreement. We also discussed them in person together during this preop appointment. All of their questions were answered to their satisfaction.  Recommended calling if they have any further questions.  Risk consent form and Pain Medication Agreement to be scanned into patient's chart.    Electronically signed by: Evelena Leyden, PA-C 10/23/2022 12:15 PM

## 2022-10-22 NOTE — H&P (View-Only) (Signed)
   Patient ID: Robert Zhang, male    DOB: 12/13/1945, 76 y.o.   MRN: [id number]  Chief Complaint  Patient presents with   Pre-op Exam      ICD-10-CM   1. Basal cell carcinoma (BCC) of skin of other part of face  C44.319        History of Present Illness: Robert Zhang is a 76 y.o.  male  with a history of right cheek basal cell carcinoma.  He presents for preoperative evaluation for upcoming procedure, reexcision and possible closure of right cheek BCC, scheduled for 11/11/2022 with Dr.  Dillingham .  Patient denies any complication or difficulty with anesthesia.  His history of prostate cancer and recently diagnosed BCC right cheek.  Denies any personal or family history of blood clots or clotting disorder.  Denies any personal history of severe cardiac or pulmonary disease.  He reports history of TIA, but no CVA.  Takes 81 mg aspirin as prescribed by vascular and vein clinic for right-sided partial carotid artery stenosis, reports less than 60%.  We have already received surgical clearance on them to hold for 7 days prior to surgery when he had the blepharoplasty last month.  Diet-controlled T2DM.  Quit smoking over 30 years ago and denies use of any nicotine-containing products.  Allergy to PCN.  Blood pressure today on initial evaluation was 205/98.  He is confident that it will come down as he reports history of whitecoat syndrome.  Prior to leaving for the office today, his blood pressure was 166/89 and that it trends down over the course of the day.  [facility].  He is asymptomatic and without any chest discomfort, difficulty breathing, blurred vision or headache, or other symptoms.  Patient had multiple blood pressures obtained here at his preoperative appointment, first was 205/98 and the second was 218/101.  He tells me that he has his cuff checked regularly at home and it is functioning normally.  He is adamant that his  blood pressure is elevated due to whitecoat syndrome, mild anxiety regarding the upcoming surgery, and suspicion that it is partially due to ingesting a large quantity of salty popcorn last evening.  Patient states that it is multifactorial, but will check it again when he gets home and either way we will call [facility] to discuss his particularly elevated readings here in the clinic.  He adamantly denies any chest discomfort, palpitations, difficulty breathing, back pain, headache or dizziness, blurred vision or other visual deficits, nausea, weakness, or other symptoms.  Regardless of whether he is asymptomatic, discussed possible evaluation in the ER for further workup for hypertensive urgency versus hypertensive emergency.  He adamantly declines and would prefer to go home.  He states that he has an arrangement with his primary care provider whereby he titrates his losartan based on his readings and simply needs to titrate up.  Patient is ambulating well here in the ED and mentating appropriately.  Patient to call his primary care provider today even if his blood pressures normalized (for him) given that they are poorly controlled.  Summary of Previous Visit: He was last seen here in clinic for postop evaluation subsequent to bilateral upper eyelid blepharoplasty and right cheek lesion excised 09/17/2022 by Dr. Dillingham.  Surgical pathology from that day revealed nodular basal cell carcinoma which clinically resembles a cystic lesion.  Peripheral margin appeared involved   and reexcision was recommended.  He was otherwise well-healed from surgery.  Job: Retired.  PMH Significant for: L4-L5 PLIF, blepharoplasty 09/17/2022, partial carotid artery stenosis on ASA 81 mg, T2DM, HTN, prostate cancer treated with radiation, gout.   Past Medical History: Allergies: Allergies  Allergen Reactions   Mirabegron     Pt states he doesn't have a reaction to this medication but can not take  it because he takes hctz   Statins     Muscle pain   Tadalafil     Dropped blood pressure too low   Zetia [Ezetimibe]     Muscle pain   Penicillins Rash and Other (See Comments)    Childhood allergy Has patient had a PCN reaction causing immediate rash, facial/tongue/throat swelling, SOB or lightheadedness with hypotension: yes Has patient had a PCN reaction causing severe rash involving mucus membranes or skin necrosis: no Has patient had a PCN reaction that required hospitalization no Has patient had a PCN reaction occurring within the last 10 years: no If all of the above answers are "NO", then may proceed with Cephalosporin use.      Current Medications:  Current Outpatient Medications:    aspirin 81 MG tablet, Take 1 tablet (81 mg total) by mouth daily., Disp: 30 tablet, Rfl:    carboxymethylcellulose (REFRESH TEARS) 0.5 % SOLN, Place 1 drop into both eyes 2 (two) times daily., Disp: , Rfl:    diclofenac Sodium (VOLTAREN) 1 % GEL, Apply 2 g topically daily as needed (joint pain)., Disp: , Rfl:    gabapentin (NEURONTIN) 300 MG capsule, Take 300 mg by mouth 2 (two) times daily., Disp: , Rfl:    Lancets (ONETOUCH ULTRASOFT) lancets, , Disp: , Rfl:    losartan (COZAAR) 100 MG tablet, Take 50 mg by mouth 2 (two) times daily., Disp: , Rfl:    ONE TOUCH ULTRA TEST test strip, , Disp: , Rfl:    oxyCODONE-acetaminophen (PERCOCET) 5-325 MG tablet, Take 1 tablet by mouth every 4 (four) hours as needed for severe pain., Disp: 30 tablet, Rfl: 0   tamsulosin (FLOMAX) 0.4 MG CAPS capsule, Take 1 capsule (0.4 mg total) by mouth daily after supper., Disp: 90 capsule, Rfl: 3  Past Medical Problems: Past Medical History:  Diagnosis Date   Aortic atherosclerosis (HCC) 10/12/2017   Noted on CT    Arthritis    bilateral legs /   Neck-Degenerative Disc Disease   Carotid artery occlusion    Right Carotid    Cervical spondylosis    Cervical stenosis of spine    Degenerative disc disease,  cervical    C4-7   Diabetes mellitus without complication (HCC)    diet and exercise controlled, no medications, hgb A1C 07/31/2017 (6.9)   Erectile dysfunction 07/31/2020   External hemorrhoids    Hepatic cyst 10/12/2017   Low density lession right hepatic lobe, noted on CT   Hepatitis    History of colon polyps    Hypertension    Leg cramps    Prostate CA (HCC)    Protrusion of intervertebral disc of lumbosacral region    L5-S1   PTSD (post-traumatic stress disorder)    PTSD (post-traumatic stress disorder)    Pulmonary nodule 11/26/2016   2mm subpleural right lower lobe, noted on CT ABD/PELVIS   Right shoulder tendinitis    Sleep apnea    Stop Bang score of 5   Stroke (HCC) 2016   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      Past Surgical History: Past Surgical History:  Procedure Laterality Date   BROW LIFT Bilateral 09/17/2022   Procedure: Bilateral upper lid blepharoplasty and excision of right cheek cyst;  Surgeon: Dillingham, Claire S, DO;  Location: [facility];  Service: Plastics;  Laterality: Bilateral;   CATARACT EXTRACTION W/PHACO  07/20/2011   Procedure: CATARACT EXTRACTION PHACO AND INTRAOCULAR LENS PLACEMENT (IOC);  Surgeon: Carroll F Haines, MD;  Location: [facility];  Service: Ophthalmology;  Laterality: Right;  CDE=25.73   CATARACT EXTRACTION W/PHACO  08/31/2011   Procedure: CATARACT EXTRACTION PHACO AND INTRAOCULAR LENS PLACEMENT (IOC);  Surgeon: Carroll F Haines, MD;  Location: [facility];  Service: Ophthalmology;  Laterality: Left;  CDE: 38.59   COLONOSCOPY N/A 09/10/2016   Procedure: COLONOSCOPY;  Surgeon: Najeeb U Rehman, MD;  Location: [facility];  Service: Endoscopy;  Laterality: N/A;  1200   COLONOSCOPY W/ POLYPECTOMY  2011   [facility]-Dr. Rehman   COLONOSCOPY WITH PROPOFOL N/A 10/15/2021   Procedure: COLONOSCOPY WITH PROPOFOL;  Surgeon: Rehman, Najeeb U, MD;  Location: [facility];  Service: Endoscopy;  Laterality: N/A;  1020    cortisone injection Left 01/13/2016   left shoulder   cortisone injection Left 09/21/2017   left shoulder   CYST EXCISION Right 09/17/2022   Procedure: CYST REMOVAL;  Surgeon: Dillingham, Claire S, DO;  Location: [facility];  Service: Plastics;  Laterality: Right;   CYSTOSCOPY  01/20/2018   Procedure: CYSTOSCOPY FLEXIBLE;  Surgeon: McKenzie, Patrick L, MD;  Location: [facility];  Service: Urology;;  no seeds found in bladder   CYSTOSCOPY  07/22/2022   Procedure: CYSTOSCOPY FLEXIBLE, FOLEY INSERTION, DILATION OF URETHRAL STRICTURE;  Surgeon: Stoneking, Bradley J., MD;  Location: [facility];  Service: Urology;;   EYE SURGERY     laser surgery to repair retina tare   head reconstruction  1966   due to head injury in war   LIPOMA EXCISION Left 02/01/2019   Procedure: EXCISION LIPOMA LEFT UPPER EXTREMITY;  Surgeon: Jenkins, Mark, MD;  Location: [facility];  Service: General;  Laterality: Left;   PROSTATE BIOPSY  05/20/2016   RADIOACTIVE SEED IMPLANT N/A 01/20/2018   Procedure: RADIOACTIVE SEED IMPLANT/BRACHYTHERAPY IMPLANT;  Surgeon: McKenzie, Patrick L, MD;  Location: [facility];  Service: Urology;  Laterality: N/A;     88    seeds implanted   REFRACTIVE SURGERY Left 03/05/2017   to resolve cloudiness   SPACE OAR INSTILLATION N/A 01/20/2018   Procedure: SPACE OAR INSTILLATION;  Surgeon: McKenzie, Patrick L, MD;  Location: [facility];  Service: Urology;  Laterality: N/A;   TRIGGER FINGER RELEASE      Social History: Social History   Socioeconomic History   Marital status: Married    Spouse name: Janice   Number of children: Not on file   Years of education: Not on file   Highest education level: Not on file  Occupational History   Occupation: retired  Tobacco Use   Smoking status: Former    Packs/day: 1.00    Years: 13.00    Additional pack years: 0.00    Total pack years: 13.00    Types: Cigarettes    Quit date: 04/17/1976    Years  since quitting: 46.5    Passive exposure: Never   Smokeless tobacco: Never  Vaping Use   Vaping Use: Never used  Substance and Sexual Activity   Alcohol use: Not Currently    Alcohol/week: 4.0 - 6.0 standard drinks   of alcohol    Types: 4 - 6 Shots of liquor per week   Drug use: Never   Sexual activity: Not Currently  Other Topics Concern   Not on file  Social History Narrative   Not on file   Social Determinants of Health   Financial Resource Strain: Not on file  Food Insecurity: Not on file  Transportation Needs: Not on file  Physical Activity: Not on file  Stress: Not on file  Social Connections: Not on file  Intimate Partner Violence: Not on file    Family History: Family History  Problem Relation Age of Onset   Diabetes Mother    Hypertension Father    Heart disease Father        before age 60   Heart attack Father    Pancreatic cancer Brother    Anesthesia problems Neg Hx    Hypotension Neg Hx    Malignant hyperthermia Neg Hx    Pseudochol deficiency Neg Hx     Review of Systems: ROS Denies any chest discomfort, difficulty breathing, leg swelling, or fevers.  Physical Exam: Vital Signs BP (!) 218/101 (BP Location: Left Arm, Cuff Size: Normal)   Pulse 69   SpO2 100%   Physical Exam Constitutional:      General: Not in acute distress.    Appearance: Normal appearance. Not ill-appearing.  HENT:     Head: Normocephalic and atraumatic.  Eyes:     Pupils: Pupils are equal, round. Cardiovascular:     Rate and Rhythm: Normal rate.    Pulses: Normal pulses.  Pulmonary:     Effort: No respiratory distress or increased work of breathing.  Speaks in full sentences. Abdominal:     General: Abdomen is flat. No distension.   Musculoskeletal: Normal range of motion. No lower extremity swelling or edema. No varicosities. Skin:    General: Skin is warm and dry.     Findings: No erythema or rash.  Neurological:     Mental Status: Alert and oriented to person,  place, and time.  Psychiatric:        Mood and Affect: Mood normal.        Behavior: Behavior normal.    Assessment/Plan: The patient is scheduled for reexcision and possible primary closure right cheek BCC with Dr. Dillingham.  Risks, benefits, and alternatives of procedure discussed, questions answered and consent obtained.    Smoking Status: Former smoker, quit 35 years ago.  Caprini Score: 8; Risk Factors include: Age, BMI greater than 25, history of cancer, and length of planned surgery. Recommendation for mechanical and possibly pharmacological prophylaxis.  Will discuss with surgeon and prescribe Lovenox if deemed indicated.  Otherwise, we will encourage early ambulation.   Pictures obtained: 09/25/2022  Post-op Rx sent to pharmacy: Doxycycline x 5 days, Oxycodone. Declines Zofran.  Patient was provided with the General Surgical Risk consent document and Pain Medication Agreement prior to their appointment.  They had adequate time to read through the risk consent documents and Pain Medication Agreement. We also discussed them in person together during this preop appointment. All of their questions were answered to their satisfaction.  Recommended calling if they have any further questions.  Risk consent form and Pain Medication Agreement to be scanned into patient's chart.    Electronically signed by: Zelig Gacek, PA-C 10/23/2022 12:15 PM 

## 2022-10-23 ENCOUNTER — Ambulatory Visit (INDEPENDENT_AMBULATORY_CARE_PROVIDER_SITE_OTHER): Payer: No Typology Code available for payment source | Admitting: Physician Assistant

## 2022-10-23 VITALS — BP 218/101 | HR 69

## 2022-10-23 DIAGNOSIS — C44319 Basal cell carcinoma of skin of other parts of face: Secondary | ICD-10-CM

## 2022-10-23 MED ORDER — OXYCODONE HCL 5 MG PO TABS: 5.0000 mg | ORAL_TABLET | Freq: Three times a day (TID) | ORAL | 0 refills | Status: AC | PRN

## 2022-10-23 MED ORDER — DOXYCYCLINE HYCLATE 100 MG PO TABS: 100.0000 mg | ORAL_TABLET | Freq: Two times a day (BID) | ORAL | 0 refills | Status: AC

## 2022-11-04 ENCOUNTER — Encounter (HOSPITAL_BASED_OUTPATIENT_CLINIC_OR_DEPARTMENT_OTHER): Payer: Self-pay | Admitting: Plastic Surgery

## 2022-11-04 ENCOUNTER — Other Ambulatory Visit: Payer: Self-pay

## 2022-11-06 ENCOUNTER — Encounter (HOSPITAL_BASED_OUTPATIENT_CLINIC_OR_DEPARTMENT_OTHER)
Admission: RE | Admit: 2022-11-06 | Discharge: 2022-11-06 | Disposition: A | Discharge status: Home / Self Care | Payer: No Typology Code available for payment source | Source: Ambulatory Visit | Attending: Plastic Surgery | Admitting: Plastic Surgery

## 2022-11-06 DIAGNOSIS — Z01818 Encounter for other preprocedural examination: Secondary | ICD-10-CM | POA: Diagnosis present

## 2022-11-06 LAB — BASIC METABOLIC PANEL
Anion gap: 9 (ref 5–15)
BUN: 17 mg/dL (ref 8–23)
CO2: 25 mmol/L (ref 22–32)
Calcium: 8.9 mg/dL (ref 8.9–10.3)
Chloride: 105 mmol/L (ref 98–111)
Creatinine, Ser: 1.02 mg/dL (ref 0.61–1.24)
GFR, Estimated: >60 mL/min (ref >60)
Glucose, Bld: 183 mg/dL — ABNORMAL HIGH (ref 70–99)
Potassium: 4.5 mmol/L (ref 3.5–5.1)
Sodium: 139 mmol/L (ref 135–145)

## 2022-11-11 ENCOUNTER — Other Ambulatory Visit: Payer: Self-pay

## 2022-11-11 ENCOUNTER — Ambulatory Visit (HOSPITAL_BASED_OUTPATIENT_CLINIC_OR_DEPARTMENT_OTHER)
Admission: RE | Admit: 2022-11-11 | Discharge: 2022-11-11 | Disposition: A | Discharge status: Home / Self Care | Payer: No Typology Code available for payment source | Attending: Plastic Surgery | Admitting: Plastic Surgery

## 2022-11-11 ENCOUNTER — Encounter (HOSPITAL_BASED_OUTPATIENT_CLINIC_OR_DEPARTMENT_OTHER): Payer: Self-pay | Admitting: Plastic Surgery

## 2022-11-11 ENCOUNTER — Encounter (HOSPITAL_BASED_OUTPATIENT_CLINIC_OR_DEPARTMENT_OTHER)
Admission: RE | Disposition: A | Discharge status: Home / Self Care | Payer: Self-pay | Source: Home / Self Care | Attending: Plastic Surgery

## 2022-11-11 ENCOUNTER — Ambulatory Visit (HOSPITAL_BASED_OUTPATIENT_CLINIC_OR_DEPARTMENT_OTHER): Payer: No Typology Code available for payment source | Admitting: Anesthesiology

## 2022-11-11 DIAGNOSIS — Z7982 Long term (current) use of aspirin: Secondary | ICD-10-CM | POA: Diagnosis not present

## 2022-11-11 DIAGNOSIS — C44319 Basal cell carcinoma of skin of other parts of face: Secondary | ICD-10-CM | POA: Diagnosis present

## 2022-11-11 DIAGNOSIS — E119 Type 2 diabetes mellitus without complications: Secondary | ICD-10-CM | POA: Diagnosis not present

## 2022-11-11 DIAGNOSIS — Z8546 Personal history of malignant neoplasm of prostate: Secondary | ICD-10-CM | POA: Diagnosis not present

## 2022-11-11 DIAGNOSIS — G4730 Sleep apnea, unspecified: Secondary | ICD-10-CM | POA: Insufficient documentation

## 2022-11-11 DIAGNOSIS — C44310 Basal cell carcinoma of skin of unspecified parts of face: Secondary | ICD-10-CM | POA: Diagnosis not present

## 2022-11-11 DIAGNOSIS — K219 Gastro-esophageal reflux disease without esophagitis: Secondary | ICD-10-CM | POA: Diagnosis not present

## 2022-11-11 DIAGNOSIS — F419 Anxiety disorder, unspecified: Secondary | ICD-10-CM | POA: Diagnosis not present

## 2022-11-11 DIAGNOSIS — E1165 Type 2 diabetes mellitus with hyperglycemia: Secondary | ICD-10-CM

## 2022-11-11 DIAGNOSIS — I10 Essential (primary) hypertension: Secondary | ICD-10-CM | POA: Diagnosis not present

## 2022-11-11 DIAGNOSIS — Z8673 Personal history of transient ischemic attack (TIA), and cerebral infarction without residual deficits: Secondary | ICD-10-CM | POA: Diagnosis not present

## 2022-11-11 DIAGNOSIS — Z87891 Personal history of nicotine dependence: Secondary | ICD-10-CM | POA: Insufficient documentation

## 2022-11-11 DIAGNOSIS — M1990 Unspecified osteoarthritis, unspecified site: Secondary | ICD-10-CM | POA: Insufficient documentation

## 2022-11-11 HISTORY — PX: LESION REMOVAL: SHX5196

## 2022-11-11 LAB — GLUCOSE, CAPILLARY
Glucose-Capillary: 101 mg/dL — ABNORMAL HIGH (ref 70–99)
Glucose-Capillary: 126 mg/dL — ABNORMAL HIGH (ref 70–99)

## 2022-11-11 SURGERY — WIDE EXCISION, LESION, UPPER EXTREMITY
Anesthesia: General | Site: Face | Laterality: Right

## 2022-11-11 MED ORDER — LIDOCAINE HCL (CARDIAC) PF 100 MG/5ML IV SOSY
PREFILLED_SYRINGE | INTRAVENOUS | Status: DC | PRN
  Administered 2022-11-11: 60 mg via INTRAVENOUS

## 2022-11-11 MED ORDER — ACETAMINOPHEN 325 MG RE SUPP: 650.0000 mg | RECTAL | Status: DC | PRN

## 2022-11-11 MED ORDER — MIDAZOLAM HCL 2 MG/2ML IJ SOLN
INTRAMUSCULAR | Status: AC
  Filled 2022-11-11: qty 2

## 2022-11-11 MED ORDER — CIPROFLOXACIN IN D5W 400 MG/200ML IV SOLN
400.0000 mg | INTRAVENOUS | Status: AC
  Administered 2022-11-11: 400 mg via INTRAVENOUS

## 2022-11-11 MED ORDER — ONDANSETRON HCL 4 MG/2ML IJ SOLN
INTRAMUSCULAR | Status: DC | PRN
  Administered 2022-11-11: 4 mg via INTRAVENOUS

## 2022-11-11 MED ORDER — SODIUM CHLORIDE 0.9% FLUSH: 3.0000 mL | INTRAVENOUS | Status: DC | PRN

## 2022-11-11 MED ORDER — OXYCODONE HCL 5 MG/5ML PO SOLN: 5.0000 mg | Freq: Once | ORAL | Status: DC | PRN

## 2022-11-11 MED ORDER — SODIUM CHLORIDE 0.9 % IV SOLN: 250.0000 mL | INTRAVENOUS | Status: DC | PRN

## 2022-11-11 MED ORDER — LIDOCAINE-EPINEPHRINE 1 %-1:100000 IJ SOLN
INTRAMUSCULAR | Status: DC | PRN
  Administered 2022-11-11: 2 mL via INTRAMUSCULAR

## 2022-11-11 MED ORDER — FENTANYL CITRATE (PF) 100 MCG/2ML IJ SOLN
INTRAMUSCULAR | Status: DC | PRN
  Administered 2022-11-11: 50 ug via INTRAVENOUS

## 2022-11-11 MED ORDER — PROPOFOL 10 MG/ML IV BOLUS
INTRAVENOUS | Status: AC
  Filled 2022-11-11: qty 20

## 2022-11-11 MED ORDER — DEXAMETHASONE SODIUM PHOSPHATE 4 MG/ML IJ SOLN
INTRAMUSCULAR | Status: DC | PRN
  Administered 2022-11-11: 5 mg via INTRAVENOUS

## 2022-11-11 MED ORDER — ACETAMINOPHEN 500 MG PO TABS
ORAL_TABLET | ORAL | Status: AC
  Filled 2022-11-11: qty 2

## 2022-11-11 MED ORDER — ONDANSETRON HCL 4 MG/2ML IJ SOLN
INTRAMUSCULAR | Status: AC
  Filled 2022-11-11: qty 2

## 2022-11-11 MED ORDER — DEXAMETHASONE SODIUM PHOSPHATE 10 MG/ML IJ SOLN
INTRAMUSCULAR | Status: AC
  Filled 2022-11-11: qty 1

## 2022-11-11 MED ORDER — PHENYLEPHRINE HCL (PRESSORS) 10 MG/ML IV SOLN
INTRAVENOUS | Status: DC | PRN
  Administered 2022-11-11: 80 ug via INTRAVENOUS
  Administered 2022-11-11: 160 ug via INTRAVENOUS
  Administered 2022-11-11: 80 ug via INTRAVENOUS
  Administered 2022-11-11: 160 ug via INTRAVENOUS

## 2022-11-11 MED ORDER — CHLORHEXIDINE GLUCONATE CLOTH 2 % EX PADS: 6.0000 | MEDICATED_PAD | Freq: Once | CUTANEOUS | Status: DC

## 2022-11-11 MED ORDER — SODIUM CHLORIDE 0.9% FLUSH: 3.0000 mL | Freq: Two times a day (BID) | INTRAVENOUS | Status: DC

## 2022-11-11 MED ORDER — OXYCODONE HCL 5 MG PO TABS: 5.0000 mg | ORAL_TABLET | Freq: Once | ORAL | Status: DC | PRN

## 2022-11-11 MED ORDER — CIPROFLOXACIN IN D5W 400 MG/200ML IV SOLN
INTRAVENOUS | Status: AC
  Filled 2022-11-11: qty 200

## 2022-11-11 MED ORDER — LIDOCAINE 2% (20 MG/ML) 5 ML SYRINGE
INTRAMUSCULAR | Status: AC
  Filled 2022-11-11: qty 5

## 2022-11-11 MED ORDER — ACETAMINOPHEN 500 MG PO TABS
1000.0000 mg | ORAL_TABLET | Freq: Once | ORAL | Status: AC
  Administered 2022-11-11: 1000 mg via ORAL

## 2022-11-11 MED ORDER — FENTANYL CITRATE (PF) 100 MCG/2ML IJ SOLN
INTRAMUSCULAR | Status: AC
  Filled 2022-11-11: qty 2

## 2022-11-11 MED ORDER — ACETAMINOPHEN 325 MG PO TABS: 650.0000 mg | ORAL_TABLET | ORAL | Status: DC | PRN

## 2022-11-11 MED ORDER — ONDANSETRON HCL 4 MG/2ML IJ SOLN: 4.0000 mg | Freq: Once | INTRAMUSCULAR | Status: DC | PRN

## 2022-11-11 MED ORDER — LACTATED RINGERS IV SOLN: INTRAVENOUS | Status: DC

## 2022-11-11 MED ORDER — PHENYLEPHRINE 80 MCG/ML (10ML) SYRINGE FOR IV PUSH (FOR BLOOD PRESSURE SUPPORT)
PREFILLED_SYRINGE | INTRAVENOUS | Status: AC
  Filled 2022-11-11: qty 10

## 2022-11-11 MED ORDER — HYDROMORPHONE HCL 1 MG/ML IJ SOLN: 0.2500 mg | INTRAMUSCULAR | Status: DC | PRN

## 2022-11-11 MED ORDER — FENTANYL CITRATE (PF) 100 MCG/2ML IJ SOLN: 25.0000 ug | INTRAMUSCULAR | Status: DC | PRN

## 2022-11-11 MED ORDER — PROPOFOL 10 MG/ML IV BOLUS
INTRAVENOUS | Status: DC | PRN
  Administered 2022-11-11: 150 mg via INTRAVENOUS

## 2022-11-11 MED ORDER — OXYCODONE HCL 5 MG PO TABS: 5.0000 mg | ORAL_TABLET | ORAL | Status: DC | PRN

## 2022-11-11 SURGICAL SUPPLY — 56 items
ADH SKN CLS APL DERMABOND .7 (GAUZE/BANDAGES/DRESSINGS)
BLADE CLIPPER SURG (BLADE) IMPLANT
BLADE SURG 15 STRL LF DISP TIS (BLADE) ×1 IMPLANT
BLADE SURG 15 STRL SS (BLADE) ×1
BNDG CMPR 5X2 KNTD ELC UNQ LF (GAUZE/BANDAGES/DRESSINGS)
BNDG CMPR 75X21 PLY HI ABS (MISCELLANEOUS)
BNDG ELASTIC 2INX 5YD STR LF (GAUZE/BANDAGES/DRESSINGS) IMPLANT
CANISTER SUCT 1200ML W/VALVE (MISCELLANEOUS) IMPLANT
CORD BIPOLAR FORCEPS 12FT (ELECTRODE) IMPLANT
COVER BACK TABLE 60X90IN (DRAPES) ×1 IMPLANT
COVER MAYO STAND STRL (DRAPES) ×1 IMPLANT
DERMABOND ADVANCED .7 DNX12 (GAUZE/BANDAGES/DRESSINGS) IMPLANT
DRAPE LAPAROTOMY 100X72 PEDS (DRAPES) IMPLANT
DRAPE U-SHAPE 76X120 STRL (DRAPES) IMPLANT
DRSG TEGADERM 2-3/8X2-3/4 SM (GAUZE/BANDAGES/DRESSINGS) IMPLANT
ELECT NDL BLADE 2-5/6 (NEEDLE) ×1 IMPLANT
ELECT NEEDLE BLADE 2-5/6 (NEEDLE) ×1 IMPLANT
ELECT REM PT RETURN 9FT ADLT (ELECTROSURGICAL)
ELECT REM PT RETURN 9FT PED (ELECTROSURGICAL)
ELECTRODE REM PT RETRN 9FT PED (ELECTROSURGICAL) IMPLANT
ELECTRODE REM PT RTRN 9FT ADLT (ELECTROSURGICAL) IMPLANT
GAUZE SPONGE 2X2 STRL 8-PLY (GAUZE/BANDAGES/DRESSINGS) IMPLANT
GAUZE SPONGE 4X4 12PLY STRL LF (GAUZE/BANDAGES/DRESSINGS) IMPLANT
GAUZE STRETCH 2X75IN STRL (MISCELLANEOUS) IMPLANT
GAUZE XEROFORM 1X8 LF (GAUZE/BANDAGES/DRESSINGS) IMPLANT
GLOVE BIO SURGEON STRL SZ 6.5 (GLOVE) ×2 IMPLANT
GOWN STRL REUS W/ TWL LRG LVL3 (GOWN DISPOSABLE) ×2 IMPLANT
GOWN STRL REUS W/TWL LRG LVL3 (GOWN DISPOSABLE) ×2
NDL HYPO 27GX1-1/4 (NEEDLE) IMPLANT
NDL HYPO 30GX1 BEV (NEEDLE) ×1 IMPLANT
NEEDLE HYPO 27GX1-1/4 (NEEDLE) IMPLANT
NEEDLE HYPO 30GX1 BEV (NEEDLE) ×1 IMPLANT
NS IRRIG 1000ML POUR BTL (IV SOLUTION) IMPLANT
PACK BASIN DAY SURGERY FS (CUSTOM PROCEDURE TRAY) ×1 IMPLANT
PENCIL SMOKE EVACUATOR (MISCELLANEOUS) IMPLANT
SHEET MEDIUM DRAPE 40X70 STRL (DRAPES) IMPLANT
SPIKE FLUID TRANSFER (MISCELLANEOUS) ×1 IMPLANT
STRIP CLOSURE SKIN 1/2X4 (GAUZE/BANDAGES/DRESSINGS) IMPLANT
SUCTION TUBE FRAZIER 10FR DISP (SUCTIONS) IMPLANT
SUT MNCRL 6-0 UNDY P1 1X18 (SUTURE) IMPLANT
SUT MNCRL AB 4-0 PS2 18 (SUTURE) IMPLANT
SUT MON AB 5-0 P3 18 (SUTURE) IMPLANT
SUT MON AB 5-0 PS2 18 (SUTURE) IMPLANT
SUT PROLENE 5 0 P 3 (SUTURE) IMPLANT
SUT PROLENE 5 0 PS 2 (SUTURE) IMPLANT
SUT PROLENE 6 0 P 1 18 (SUTURE) IMPLANT
SUT SILK 4 0 P 3 (SUTURE) IMPLANT
SUT VIC AB 4-0 PS2 18 (SUTURE) IMPLANT
SUT VIC AB 5-0 P-3 18X BRD (SUTURE) IMPLANT
SUT VIC AB 5-0 P3 18 (SUTURE)
SUT VIC AB 5-0 PS2 18 (SUTURE) IMPLANT
SYR BULB EAR ULCER 3OZ GRN STR (SYRINGE) IMPLANT
SYR CONTROL 10ML LL (SYRINGE) ×1 IMPLANT
TOWEL GREEN STERILE FF (TOWEL DISPOSABLE) ×1 IMPLANT
TRAY DSU PREP LF (CUSTOM PROCEDURE TRAY) ×1 IMPLANT
TUBE CONNECTING 20X1/4 (TUBING) IMPLANT

## 2022-11-11 NOTE — Op Note (Signed)
DATE OF OPERATION: 11/11/2022  LOCATION: [facility]  PREOPERATIVE DIAGNOSIS: right cheek basal cell carcinoma  POSTOPERATIVE DIAGNOSIS: Same  PROCEDURE: Excision of basal cell carcinoma 1 x 2 cm  SURGEON: Amreen Raczkowski Sanger Crislyn Willbanks, DO  ASSISTANT: Keenan Bachelor, PA  EBL: none  CONDITION: Stable  COMPLICATIONS: None  INDICATION: The patient, Robert Zhang, is a 78 y.o. male born on 02-25-1946, is here for treatment of positive margins from excision of basal cell carcinoma of right cheek.   PROCEDURE DETAILS:  The patient was seen prior to surgery and marked.  The IV antibiotics were given. The patient was taken to the operating room and given a general anesthetic. A standard time out was performed and all information was confirmed by those in the room. SCDs were placed.   The face was prepped and draped.  Local with epinephrine was injected into the marked area of the right cheek.  The #15 blade was used to excise the 1 x 2 cm lesion.  A marking stitch was placed at 12 o'clock / superior short and a long stitch at lateral margin.  The deep layer was closed with the 6-0 Monocryl followed by a running 6-0 Monocryl.  Derma bond and steri strips were applied. The patient was allowed to wake up and taken to recovery room in stable condition at the end of the case. The family was notified at the end of the case.   The advanced practice practitioner (APP) assisted throughout the case.  The APP was essential in retraction and counter traction when needed to make the case progress smoothly.  This retraction and assistance made it possible to see the tissue plans for the procedure.  The assistance was needed for blood control, tissue re-approximation and assisted with closure of the incision site.

## 2022-11-11 NOTE — Anesthesia Procedure Notes (Signed)
Procedure Name: LMA Insertion Date/Time: 11/11/2022 1:39 PM  Performed by: Cleda Clarks, CRNAPre-anesthesia Checklist: Patient identified, Emergency Drugs available, Suction available and Patient being monitored Patient Re-evaluated:Patient Re-evaluated prior to induction Oxygen Delivery Method: Circle system utilized Preoxygenation: Pre-oxygenation with 100% oxygen Induction Type: IV induction Ventilation: Mask ventilation without difficulty LMA: LMA inserted LMA Size: 4.0 Number of attempts: 1 Placement Confirmation: positive ETCO2 Tube secured with: Tape Dental Injury: Teeth and Oropharynx as per pre-operative assessment

## 2022-11-11 NOTE — Interval H&P Note (Signed)
History and Physical Interval Note:  11/11/2022 12:59 PM  Robert Zhang  has presented today for surgery, with the diagnosis of basal cell carcinoma.  The various methods of treatment have been discussed with the patient and family. After consideration of risks, benefits and other options for treatment, the patient has consented to  Procedure(s): reexcision and possible closure right cheek BCC (Right) as a surgical intervention.  The patient's history has been reviewed, patient examined, no change in status, stable for surgery.  I have reviewed the patient's chart and labs.  Questions were answered to the patient's satisfaction.     Kresta Templeman S Abbey Veith   

## 2022-11-11 NOTE — Interval H&P Note (Signed)
History and Physical Interval Note:  11/11/2022 12:59 PM  Robert Zhang  has presented today for surgery, with the diagnosis of basal cell carcinoma.  The various methods of treatment have been discussed with the patient and family. After consideration of risks, benefits and other options for treatment, the patient has consented to  Procedure(s): reexcision and possible closure right cheek BCC (Right) as a surgical intervention.  The patient's history has been reviewed, patient examined, no change in status, stable for surgery.  I have reviewed the patient's chart and labs.  Questions were answered to the patient's satisfaction.     Alena Bills Kelso Bibby

## 2022-11-11 NOTE — Discharge Instructions (Signed)
No Tylenol before 7:00pm if needed.  Post Anesthesia Home Care Instructions  Activity: Get plenty of rest for the remainder of the day. A responsible individual must stay with you for 24 hours following the procedure.  For the next 24 hours, DO NOT: -Drive a car -Operate machinery -Drink alcoholic beverages -Take any medication unless instructed by your physician -Make any legal decisions or sign important papers.  Meals: Start with liquid foods such as gelatin or soup. Progress to regular foods as tolerated. Avoid greasy, spicy, heavy foods. If nausea and/or vomiting occur, drink only clear liquids until the nausea and/or vomiting subsides. Call your physician if vomiting continues.  Special Instructions/Symptoms: Your throat may feel dry or sore from the anesthesia or the breathing tube placed in your throat during surgery. If this causes discomfort, gargle with warm salt water. The discomfort should disappear within 24 hours.     

## 2022-11-11 NOTE — Anesthesia Postprocedure Evaluation (Signed)
Anesthesia Post Note  Patient: Robert Zhang  Procedure(s) Performed: reexcision and possible closure right cheek BCC (Right: Face)     Patient location during evaluation: PACU Anesthesia Type: General Level of consciousness: awake and alert, oriented and patient cooperative Pain management: pain level controlled Vital Signs Assessment: post-procedure vital signs reviewed and stable Respiratory status: spontaneous breathing, nonlabored ventilation and respiratory function stable Cardiovascular status: blood pressure returned to baseline and stable Postop Assessment: no apparent nausea or vomiting Anesthetic complications: no   No notable events documented.  Last Vitals:  Vitals:   11/11/22 1415 11/11/22 1430  BP: (!) 151/79 138/78  Pulse: 82 76  Resp: 12 11  Temp:    SpO2: 99% 94%    Last Pain:  Vitals:   11/11/22 1430  TempSrc:   PainSc: 0-No pain                 Lannie Fields

## 2022-11-11 NOTE — Transfer of Care (Signed)
Immediate Anesthesia Transfer of Care Note  Patient: Robert Zhang  Procedure(s) Performed: reexcision and possible closure right cheek BCC (Right: Face)  Patient Location: PACU  Anesthesia Type:General  Level of Consciousness: awake, alert , and oriented  Airway & Oxygen Therapy: Patient Spontanous Breathing and Patient connected to face mask oxygen  Post-op Assessment: Report given to RN and Post -op Vital signs reviewed and stable  Post vital signs: Reviewed and stable  Last Vitals:  Vitals Value Taken Time  BP 151/79 11/11/22 1415  Temp    Pulse 78 11/11/22 1419  Resp 12 11/11/22 1419  SpO2 98 % 11/11/22 1419  Vitals shown include unvalidated device data.  Last Pain:  Vitals:   11/11/22 1252  TempSrc: Oral  PainSc: 0-No pain      Patients Stated Pain Goal: 3 (11/11/22 1252)  Complications: No notable events documented.

## 2022-11-11 NOTE — Anesthesia Preprocedure Evaluation (Addendum)
Anesthesia Evaluation  Patient identified by MRN, date of birth, ID band Patient awake    Reviewed: Allergy & Precautions, NPO status , Patient's Chart, lab work & pertinent test results  Airway Mallampati: III  TM Distance: >3 FB Neck ROM: Full    Dental  (+) Teeth Intact, Dental Advisory Given   Pulmonary sleep apnea (pt denies) , former smoker Quit smoking 1977, 13 pack year history  Pt denies ever having sleep study, snores at night   Pulmonary exam normal breath sounds clear to auscultation       Cardiovascular hypertension (168/83 preop, per pt very labile), Pt. on medications Normal cardiovascular exam Rhythm:Regular Rate:Normal     Neuro/Psych  PSYCHIATRIC DISORDERS Anxiety     CVA (2016)    GI/Hepatic Neg liver ROS,GERD  Controlled,,  Endo/Other  diabetes, Well Controlled, Type 2    Renal/GU negative Renal ROS  negative genitourinary   Musculoskeletal  (+) Arthritis , Osteoarthritis,    Abdominal   Peds  Hematology negative hematology ROS (+)   Anesthesia Other Findings   Reproductive/Obstetrics negative OB ROS                             Anesthesia Physical Anesthesia Plan  ASA: 3  Anesthesia Plan: General   Post-op Pain Management: Tylenol PO (pre-op)*   Induction: Intravenous  PONV Risk Score and Plan: 2 and Ondansetron, Dexamethasone and Treatment may vary due to age or medical condition  Airway Management Planned: Oral ETT  Additional Equipment: None  Intra-op Plan:   Post-operative Plan: Extubation in OR  Informed Consent: I have reviewed the patients History and Physical, chart, labs and discussed the procedure including the risks, benefits and alternatives for the proposed anesthesia with the patient or authorized representative who has indicated his/her understanding and acceptance.     Dental advisory given  Plan Discussed with: CRNA  Anesthesia Plan  Comments:         Anesthesia Quick Evaluation

## 2022-11-12 ENCOUNTER — Encounter (HOSPITAL_BASED_OUTPATIENT_CLINIC_OR_DEPARTMENT_OTHER): Payer: Self-pay | Admitting: Plastic Surgery

## 2022-11-13 LAB — SURGICAL PATHOLOGY

## 2022-11-17 NOTE — Progress Notes (Signed)
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 Ulice Bold who presents to clinic for postoperative follow-up.  Reviewed operative report and the skin was closed with running 6-0 Monocryl followed by Dermabond and Steri-Strips.  Reviewed surgical pathology and there is no evidence of lymphovascular invasion and the resection margins are negative.  However there is minute foci suspicious for minimal residual basal cell carcinoma involving the superficial dermis.    Today, patient is doing well.  He believes he has some sutures that he is hopeful can be removed today in clinic.  Otherwise, denies any discomfort and reports no issues during his postoperative period.  On exam, the reexcision site appears to be well-approximated.  Small incisional wound superiorly, approximately 3 x 1 mm.  Superficial.  Remainder of closure appears CDI.  Several scattered Monocryl sutures removed without complication or difficulty.  Mildly dry appearing.  No erythema or induration.  No evidence concerning for infection.  Sutures removed without complication.  Recommending thin film of Vaseline and gentle massage 1-2 times daily.  Avoid excessive UV exposure.  As for the pathology report, will discuss with Dr. Ulice Bold who will be seeing him for his next postoperative encounter.  Picture(s) obtained of the patient and placed in the chart were with the patient's or guardian's permission.  His BP today is elevated at 181/78. He followed up with his primary care provider after his preoperative encounter given systolic greater than 200.  He states that the reading today is typical for him.  Continues to deny any chest discomfort, difficulty breathing, back pain, headache, dizziness, blurred vision, nausea, fatigue, or other symptoms concerning for ACS.  He understands to continue following up with primary care provider for ongoing  management.

## 2022-11-20 ENCOUNTER — Encounter: Payer: Self-pay | Admitting: Physician Assistant

## 2022-11-20 ENCOUNTER — Ambulatory Visit (INDEPENDENT_AMBULATORY_CARE_PROVIDER_SITE_OTHER): Payer: No Typology Code available for payment source | Admitting: Physician Assistant

## 2022-11-20 VITALS — BP 181/78 | HR 106

## 2022-11-20 DIAGNOSIS — C44319 Basal cell carcinoma of skin of other parts of face: Secondary | ICD-10-CM

## 2022-11-24 DIAGNOSIS — M4316 Spondylolisthesis, lumbar region: Secondary | ICD-10-CM | POA: Diagnosis not present

## 2022-11-24 DIAGNOSIS — M48062 Spinal stenosis, lumbar region with neurogenic claudication: Secondary | ICD-10-CM | POA: Diagnosis not present

## 2022-11-27 ENCOUNTER — Ambulatory Visit: Payer: Non-veteran care | Admitting: Plastic Surgery

## 2022-12-04 ENCOUNTER — Ambulatory Visit (INDEPENDENT_AMBULATORY_CARE_PROVIDER_SITE_OTHER): Payer: No Typology Code available for payment source | Admitting: Plastic Surgery

## 2022-12-04 ENCOUNTER — Encounter: Payer: Self-pay | Admitting: Plastic Surgery

## 2022-12-04 VITALS — BP 202/90 | HR 89 | Ht 68.0 in | Wt 170.0 lb

## 2022-12-04 DIAGNOSIS — C44319 Basal cell carcinoma of skin of other parts of face: Secondary | ICD-10-CM | POA: Diagnosis not present

## 2022-12-04 DIAGNOSIS — C4491 Basal cell carcinoma of skin, unspecified: Secondary | ICD-10-CM

## 2022-12-04 NOTE — Progress Notes (Signed)
The patient is a 77 yrs old male here for follow up after re-excision of BCC of right cheek.  He looks great.  No sign of infection.  Path indicating possible positive margin.  Plan for 5FU at next visit.  Patient aware.  Pictures were obtained of the patient and placed in the chart with the patient's or guardian's permission.

## 2022-12-15 ENCOUNTER — Encounter: Payer: Non-veteran care | Admitting: Physician Assistant

## 2022-12-24 NOTE — Progress Notes (Signed)
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 Ulice Bold who presents to clinic for postoperative follow-up.   He was last seen here in clinic on 12/04/2022.  At that time, exam was benign.  Given pathology indicating possible positive margin, plan was for initiation of 5-FU beginning at next encounter.  Today, patient is doing well.  No specific complaints.  States that he has some left shoulder arthritic discomfort, but has not had any issues with his BCC excision site and that it has been well-healed for a while.  On exam, excision site is completely healed.  No persistent wounds.  Minimal scarring.  Per discussion with Dr. Ulice Bold, we will plan for application of 5-FU topically to right cheek BCC excision site twice daily x 4 weeks.  He has already discussed this plan with Dr. Ulice Bold and understands that it will likely result in skin irritation including redness, inflammation, and possible burning sensation.  Follow-up in 4 weeks.  He will call the office should he have any questions or concerns in the interim.  Picture(s) obtained of the patient and placed in the chart were with the patient's or guardian's permission.

## 2022-12-25 ENCOUNTER — Ambulatory Visit (INDEPENDENT_AMBULATORY_CARE_PROVIDER_SITE_OTHER): Payer: No Typology Code available for payment source | Admitting: Physician Assistant

## 2022-12-25 ENCOUNTER — Telehealth: Payer: Self-pay | Admitting: Physician Assistant

## 2022-12-25 DIAGNOSIS — C44319 Basal cell carcinoma of skin of other parts of face: Secondary | ICD-10-CM

## 2022-12-25 DIAGNOSIS — C4491 Basal cell carcinoma of skin, unspecified: Secondary | ICD-10-CM

## 2022-12-25 MED ORDER — FLUOROURACIL 5 % EX CREA: TOPICAL_CREAM | Freq: Two times a day (BID) | CUTANEOUS | 0 refills | Status: AC

## 2022-12-25 NOTE — Telephone Encounter (Signed)
I completed a hand written prescription for the medication and it is on my desk. However, when I called him to inquire about faxing instructions he told me that he was still waiting to hear back from the [facility] about authorization.  If he does call back in the next few hours, the prescription is completed and on my desk.  Thank you.

## 2022-12-25 NOTE — Telephone Encounter (Signed)
Pt called and would like for you to call him.  The medication you prescribed for him this morning, Fluorouracil, was a $100 copay when he went to the pharmacy and not $10.  He would like to Rx sent to the [place].  Please call him at [phone]

## 2023-01-06 DIAGNOSIS — E0590 Thyrotoxicosis, unspecified without thyrotoxic crisis or storm: Secondary | ICD-10-CM | POA: Diagnosis not present

## 2023-01-06 DIAGNOSIS — I10 Essential (primary) hypertension: Secondary | ICD-10-CM | POA: Diagnosis not present

## 2023-01-06 DIAGNOSIS — J449 Chronic obstructive pulmonary disease, unspecified: Secondary | ICD-10-CM | POA: Diagnosis not present

## 2023-01-06 DIAGNOSIS — E1140 Type 2 diabetes mellitus with diabetic neuropathy, unspecified: Secondary | ICD-10-CM | POA: Diagnosis not present

## 2023-01-14 NOTE — Progress Notes (Addendum)
Referring Provider Assunta Found, MD [address] [place],  [place] [address]   CC:  Chief Complaint  Patient presents with   Wound Check      Robert Zhang is an 77 y.o. male.  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 Ulice Bold who presents to clinic for postoperative follow-up.   He was last seen here in clinic on 12/25/2022.  At that time, excision site appeared to be fully healed.  No persistent wounds and minimal scarring noted.  After discussion with Dr. Ulice Bold, ordered application of 5-FU topically to right cheek basal cell carcinoma excision site twice daily x 4 weeks.  Plan for follow-up after conclusion of treatment and asked that he call the office should he have any questions or concerns in interim.  Today, patient is doing well.  He states that he has some localized burning and itching at the site of his 5-FU treatment, but consistent with what we have discussed before initiation.  He has had some redness as well, but localized.  He has completed 17 days and understands that he has 11 more days of treatment.  He is pleased thus far with how things are going.   Allergies  Allergen Reactions   Mirabegron     Pt states he doesn't have a reaction to this medication but can not take it because he takes hctz   Statins     Muscle pain   Tadalafil     Dropped blood pressure too low   Zetia [Ezetimibe]     Muscle pain   Penicillins Rash and Other (See Comments)    Childhood allergy Has patient had a PCN reaction causing immediate rash, facial/tongue/throat swelling, SOB or lightheadedness with hypotension: yes Has patient had a PCN reaction causing severe rash involving mucus membranes or skin necrosis: no Has patient had a PCN reaction that required hospitalization no Has patient had a PCN reaction occurring within the last 10 years: no If all of the above  answers are "NO", then may proceed with Cephalosporin use.      Outpatient Encounter Medications as of 01/15/2023  Medication Sig   aspirin 81 MG tablet Take 1 tablet (81 mg total) by mouth daily.   carboxymethylcellulose (REFRESH TEARS) 0.5 % SOLN Place 1 drop into both eyes 2 (two) times daily.   diclofenac Sodium (VOLTAREN) 1 % GEL Apply 2 g topically daily as needed (joint pain).   fluorouracil (EFUDEX) 5 % cream Apply topically 2 (two) times daily for 28 days. Please apply 1/4" ointment to the right cheek basal cell carcinoma excision site twice daily x 4 weeks.   gabapentin (NEURONTIN) 300 MG capsule Take 300 mg by mouth 2 (two) times daily.   Lancets (ONETOUCH ULTRASOFT) lancets    losartan (COZAAR) 100 MG tablet Take 50 mg by mouth 2 (two) times daily.   naproxen (NAPROSYN) 250 MG tablet Take by mouth 2 (two) times daily with a meal.   ONE TOUCH ULTRA TEST test strip    tamsulosin (FLOMAX) 0.4 MG CAPS capsule Take 1 capsule (0.4 mg total) by mouth daily after supper.   No facility-administered encounter medications on file as of 01/15/2023.     Past Medical History:  Diagnosis Date   Aortic atherosclerosis (HCC) 10/12/2017   Noted on CT    Arthritis    bilateral legs /   Neck-Degenerative Disc Disease   Carotid  artery occlusion    Right Carotid    Cervical spondylosis    Cervical stenosis of spine    Degenerative disc disease, cervical    C4-7   Diabetes mellitus without complication (HCC)    diet and exercise controlled, no medications, hgb A1C 07/31/2017 (6.9)   Erectile dysfunction 07/31/2020   External hemorrhoids    Hepatic cyst 10/12/2017   Low density lession right hepatic lobe, noted on CT   Hepatitis    History of colon polyps    Hypertension    Leg cramps    Prostate CA (HCC)    Protrusion of intervertebral disc of lumbosacral region    L5-S1   PTSD (post-traumatic stress disorder)    PTSD (post-traumatic stress disorder)    Pulmonary nodule 11/26/2016    2mm subpleural right lower lobe, noted on CT ABD/PELVIS   Right shoulder tendinitis    Sleep apnea    Stop Bang score of 5   Stroke (HCC) 2016   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    Past Surgical History:  Procedure Laterality Date   BROW LIFT Bilateral 09/17/2022   Procedure: Bilateral upper lid blepharoplasty and excision of right cheek cyst;  Surgeon: Peggye Form, DO;  Location: [facility];  Service: Plastics;  Laterality: Bilateral;   CATARACT EXTRACTION W/PHACO  07/20/2011   Procedure: CATARACT EXTRACTION PHACO AND INTRAOCULAR LENS PLACEMENT (IOC);  Surgeon: Susa Simmonds, MD;  Location: [facility];  Service: Ophthalmology;  Laterality: Right;  CDE=25.73   CATARACT EXTRACTION W/PHACO  08/31/2011   Procedure: CATARACT EXTRACTION PHACO AND INTRAOCULAR LENS PLACEMENT (IOC);  Surgeon: Susa Simmonds, MD;  Location: [facility];  Service: Ophthalmology;  Laterality: Left;  CDE: 38.59   COLONOSCOPY N/A 09/10/2016   Procedure: COLONOSCOPY;  Surgeon: Malissa Hippo, MD;  [facility];  Service: Endoscopy;  Laterality: N/A;  1200   COLONOSCOPY W/ POLYPECTOMY  2011   [facility]-Dr. Rehman   COLONOSCOPY WITH PROPOFOL N/A 10/15/2021   Procedure: COLONOSCOPY WITH PROPOFOL;  Surgeon: Malissa Hippo, MD;  [facility];  Service: Endoscopy;  Laterality: N/A;  1020   cortisone injection Left 01/13/2016   left shoulder   cortisone injection Left 09/21/2017   left shoulder   CYST EXCISION Right 09/17/2022   Procedure: CYST REMOVAL;  Surgeon: Peggye Form, DO;  Location: [facility];  Service: Plastics;  Laterality: Right;   CYSTOSCOPY  01/20/2018   Procedure: CYSTOSCOPY FLEXIBLE;  Surgeon: Malen Gauze, MD;  Location: [facility];  Service: Urology;;  no seeds found in bladder   CYSTOSCOPY  07/22/2022   Procedure: CYSTOSCOPY FLEXIBLE, FOLEY INSERTION, DILATION OF URETHRAL STRICTURE;  Surgeon:  Milderd Meager., MD;  Location: [facility];  Service: Urology;;   EYE SURGERY     laser surgery to repair retina tare   head reconstruction  1966   due to head injury in war   LESION REMOVAL Right 11/11/2022   Procedure: reexcision and possible closure right cheek BCC;  Surgeon: Peggye Form, DO;  Location:  SUR[facility]ERY CENTER;  Service: Plastics;  Laterality: Right;   LIPOMA EXCISION Left 02/01/2019   Procedure: EXCISION LIPOMA LEFT UPPER EXTREMITY;  Surgeon: Franky Macho, MD;  Location: [facility];  Service: General;  Laterality: Left;   PROSTATE BIOPSY  05/20/2016   RADIOACTIVE SEED IMPLANT N/A 01/20/2018   Procedure: RADIOACTIVE SEED IMPLANT/BRACHYTHERAPY IMPLANT;  Surgeon: Malen Gauze, MD;  Location: [facility];  Service: Urology;  Laterality: N/A;     88    seeds implanted   REFRACTIVE SURGERY Left 03/05/2017   to resolve cloudiness   SPACE OAR INSTILLATION N/A 01/20/2018   Procedure: SPACE OAR INSTILLATION;  Surgeon: Malen Gauze, MD;  Location: [facility];  Service: Urology;  Laterality: N/A;   TRIGGER FINGER RELEASE      Family History  Problem Relation Age of Onset   Diabetes Mother    Hypertension Father    Heart disease Father        before age 57   Heart attack Father    Pancreatic cancer Brother    Anesthesia problems Neg Hx    Hypotension Neg Hx    Malignant hyperthermia Neg Hx    Pseudochol deficiency Neg Hx     Social History   Social History Narrative   Not on file     Review of Systems General: Denies fevers or chills Cardio: Denies chest pain Pulmonary: Denies difficulty breathing Skin: Endorses intermittent burning and itching at the 5-FU placement site.  Physical Exam    01/15/2023    9:44 AM 12/04/2022    8:53 AM 11/20/2022    8:13 AM  Vitals with BMI  Height  5\' 8"    Weight  170 lbs   BMI  25.85   Systolic 204 202 782  Diastolic 94 90 78  Pulse 75 89 106    General:  No acute distress,  nontoxic appearing  Respiratory: No increased work of breathing Neuro: Alert and oriented Psychiatric: Normal mood and affect  Skin: See image.   Assessment/Plan  Right cheek BCC excision:  Patient is doing well with 5-FU treatments.  Exam is consistent with what we would expect.  While he has had some intermittent burning discomfort, he reports that it is well-controlled and would like to continue with his treatment course.  Continue for an additional 11 days of topical application and then follow-up in 3 weeks for reevaluation.  Dr. Ulice Bold also evaluated patient at bedside and agrees with plan.  Picture(s) obtained of the patient and placed in the chart were with the patient's or guardian's permission.  Addendum: Patient's BP was markedly elevated at today's encounter.  Repeat was 190/96.  Unfortunately, this has been relatively consistent with his prior encounters.  [facility] high blood pressure.  He tells me that he has a good working relationship with them.  He denies any cardiac symptoms.  Will continue to advise patient that his considerable hypertension is putting him at risk for severe and life-threatening disease.   Evelena Leyden PA-C 01/15/2023, 9:56 AM

## 2023-01-15 ENCOUNTER — Encounter: Payer: Self-pay | Admitting: Physician Assistant

## 2023-01-15 ENCOUNTER — Ambulatory Visit (INDEPENDENT_AMBULATORY_CARE_PROVIDER_SITE_OTHER): Payer: No Typology Code available for payment source | Admitting: Physician Assistant

## 2023-01-15 VITALS — BP 190/96 | HR 75

## 2023-01-15 DIAGNOSIS — C44319 Basal cell carcinoma of skin of other parts of face: Secondary | ICD-10-CM | POA: Diagnosis not present

## 2023-01-15 DIAGNOSIS — C4491 Basal cell carcinoma of skin, unspecified: Secondary | ICD-10-CM

## 2023-02-03 ENCOUNTER — Ambulatory Visit (INDEPENDENT_AMBULATORY_CARE_PROVIDER_SITE_OTHER): Payer: No Typology Code available for payment source | Admitting: Physician Assistant

## 2023-02-03 VITALS — BP 192/92 | HR 95

## 2023-02-03 DIAGNOSIS — C4491 Basal cell carcinoma of skin, unspecified: Secondary | ICD-10-CM

## 2023-02-03 DIAGNOSIS — C44319 Basal cell carcinoma of skin of other parts of face: Secondary | ICD-10-CM

## 2023-02-03 NOTE — Progress Notes (Signed)
Referring Provider Assunta Found, MD [address] [place],  [place] [address]   CC:  Chief Complaint  Patient presents with   Post-op Follow-up      Robert Zhang is an 77 y.o. male.  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 Ulice Bold who presents to clinic for postoperative follow-up.   Patient was last seen here in clinic on 01/15/2023.  At that time, he reported some localized burning and itching the site of his 5-FU treatment.  Consistent with expectations.  He had completed 17 days and understood that there were an additional 11 days remaining in his treatment.  Overall pleased with how things were going.  Exam was as expected.  Recommended continuation based on patient tolerance.  Today, patient is doing well.  He states that he discontinued the treatment a few days prior to the completion of the full 4 weeks due to his skin cracking and bleeding.  Since then, he has been applying Vaseline and it has healed nicely.  He is now quite pleased with the cosmesis and recovery from his reexcision and closure.   Allergies  Allergen Reactions   Mirabegron     Pt states he doesn't have a reaction to this medication but can not take it because he takes hctz   Statins     Muscle pain   Tadalafil     Dropped blood pressure too low   Zetia [Ezetimibe]     Muscle pain   Penicillins Rash and Other (See Comments)    Childhood allergy Has patient had a PCN reaction causing immediate rash, facial/tongue/throat swelling, SOB or lightheadedness with hypotension: yes Has patient had a PCN reaction causing severe rash involving mucus membranes or skin necrosis: no Has patient had a PCN reaction that required hospitalization no Has patient had a PCN reaction occurring within the last 10 years: no If all of the above answers are "NO", then may proceed with Cephalosporin use.       Outpatient Encounter Medications as of 02/03/2023  Medication Sig   aspirin 81 MG tablet Take 1 tablet (81 mg total) by mouth daily.   carboxymethylcellulose (REFRESH TEARS) 0.5 % SOLN Place 1 drop into both eyes 2 (two) times daily.   diclofenac Sodium (VOLTAREN) 1 % GEL Apply 2 g topically daily as needed (joint pain).   gabapentin (NEURONTIN) 300 MG capsule Take 300 mg by mouth 2 (two) times daily.   Lancets (ONETOUCH ULTRASOFT) lancets    losartan (COZAAR) 100 MG tablet Take 50 mg by mouth 2 (two) times daily.   naproxen (NAPROSYN) 250 MG tablet Take by mouth 2 (two) times daily with a meal.   ONE TOUCH ULTRA TEST test strip    tamsulosin (FLOMAX) 0.4 MG CAPS capsule Take 1 capsule (0.4 mg total) by mouth daily after supper.   No facility-administered encounter medications on file as of 02/03/2023.     Past Medical History:  Diagnosis Date   Aortic atherosclerosis (HCC) 10/12/2017   Noted on CT    Arthritis    bilateral legs /   Neck-Degenerative Disc Disease   Carotid artery occlusion    Right Carotid    Cervical spondylosis    Cervical stenosis of spine    Degenerative disc disease, cervical    C4-7   Diabetes mellitus without complication (HCC)    diet and exercise controlled, no medications, hgb A1C 07/31/2017 (  6.9)   Erectile dysfunction 07/31/2020   External hemorrhoids    Hepatic cyst 10/12/2017   Low density lession right hepatic lobe, noted on CT   Hepatitis    History of colon polyps    Hypertension    Leg cramps    Prostate CA (HCC)    Protrusion of intervertebral disc of lumbosacral region    L5-S1   PTSD (post-traumatic stress disorder)    PTSD (post-traumatic stress disorder)    Pulmonary nodule 11/26/2016   2mm subpleural right lower lobe, noted on CT ABD/PELVIS   Right shoulder tendinitis    Sleep apnea    Stop Bang score of 5   Stroke (HCC) 2016   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    Past Surgical History:  Procedure Laterality  Date   BROW LIFT Bilateral 09/17/2022   Procedure: Bilateral upper lid blepharoplasty and excision of right cheek cyst;  Surgeon: Peggye Form, DO;  Location: [facility];  Service: Plastics;  Laterality: Bilateral;   CATARACT EXTRACTION W/PHACO  07/20/2011   Procedure: CATARACT EXTRACTION PHACO AND INTRAOCULAR LENS PLACEMENT (IOC);  Surgeon: Susa Simmonds, MD;  Location: [facility];  Service: Ophthalmology;  Laterality: Right;  CDE=25.73   CATARACT EXTRACTION W/PHACO  08/31/2011   Procedure: CATARACT EXTRACTION PHACO AND INTRAOCULAR LENS PLACEMENT (IOC);  Surgeon: Susa Simmonds, MD;  Location: [facility];  Service: Ophthalmology;  Laterality: Left;  CDE: 38.59   COLONOSCOPY N/A 09/10/2016   Procedure: COLONOSCOPY;  Surgeon: Malissa Hippo, MD;  [facility];  Service: Endoscopy;  Laterality: N/A;  1200   COLONOSCOPY W/ POLYPECTOMY  2011   [facility]-Dr. Rehman   COLONOSCOPY WITH PROPOFOL N/A 10/15/2021   Procedure: COLONOSCOPY WITH PROPOFOL;  Surgeon: Malissa Hippo, MD;  [facility];  Service: Endoscopy;  Laterality: N/A;  1020   cortisone injection Left 01/13/2016   left shoulder   cortisone injection Left 09/21/2017   left shoulder   CYST EXCISION Right 09/17/2022   Procedure: CYST REMOVAL;  Surgeon: Peggye Form, DO;  Location: [facility];  Service: Plastics;  Laterality: Right;   CYSTOSCOPY  01/20/2018   Procedure: CYSTOSCOPY FLEXIBLE;  Surgeon: Malen Gauze, MD;  Location: [facility];  Service: Urology;;  no seeds found in bladder   CYSTOSCOPY  07/22/2022   Procedure: CYSTOSCOPY FLEXIBLE, FOLEY INSERTION, DILATION OF URETHRAL STRICTURE;  Surgeon: Milderd Meager., MD;  [facility];  Service: Urology;;   EYE SURGERY     laser surgery to repair retina tare   head reconstruction  1966   due to head injury in war   LESION REMOVAL Right 11/11/2022   Procedure: reexcision and possible closure  right cheek BCC;  Surgeon: Peggye Form, DO;  Location:  SUR[facility]ERY C[facility];  Service: Plastics;  Laterality: Right;   LIPOMA EXCISION Left 02/01/2019   Procedure: EXCISION LIPOMA LEFT UPPER EXTREMITY;  Surgeon: Franky Macho, MD;  Location: [facility];  Service: General;  Laterality: Left;   PROSTATE BIOPSY  05/20/2016   RADIOACTIVE SEED IMPLANT N/A 01/20/2018   Procedure: RADIOACTIVE SEED IMPLANT/BRACHYTHERAPY IMPLANT;  Surgeon: Malen Gauze, MD;  [facility];  Service: Urology;  Laterality: N/A;     88    seeds implanted   REFRACTIVE SURGERY Left 03/05/2017   to resolve cloudiness   SPACE OAR INSTILLATION N/A 01/20/2018   Procedure: SPACE OAR INSTILLATION;  Surgeon: Wilkie Aye  L, MD;  Location: [facility];  Service: Urology;  Laterality: N/A;   TRIGGER FINGER RELEASE      Family History  Problem Relation Age of Onset   Diabetes Mother    Hypertension Father    Heart disease Father        before age 44   Heart attack Father    Pancreatic cancer Brother    Anesthesia problems Neg Hx    Hypotension Neg Hx    Malignant hyperthermia Neg Hx    Pseudochol deficiency Neg Hx     Social History   Social History Narrative   Not on file     Review of Systems General: Denies fevers or chills Cardio: Denies chest pain Pulmonary: Denies difficulty breathing Skin: Endorses healing  Physical Exam    02/03/2023    8:44 AM 01/15/2023   10:15 AM 01/15/2023    9:44 AM  Vitals with BMI  Systolic 192 190 621  Diastolic 92 96 94  Pulse 95  75    General:  No acute distress, nontoxic appearing  Respiratory: No increased work of breathing Neuro: Alert and oriented Psychiatric: Normal mood and affect  Skin: Reexcision site now fully healed.  No obvious lesion or recurrence.  Minimal persisting erythema, but significantly improved compared to last encounter.  No open wounds.  No evidence concerning for  infection.  Assessment/Plan  Right cheek BCC excision:  Patient did not complete the full 4-week course, but was greater than 3 weeks of treatment before medication intolerance due to side effects.  At this time, we will proceed with careful monitoring as discussed with Dr. Ulice Bold for any evidence concerning for recurrence.  At that time, can consider additional reexcision.  Otherwise, he is well-healed from his surgeries and follow-up is only as needed.  His blood pressure was again markedly elevated here in clinic.  He tells me that the [place] doctor who prescribes his antihypertensives has a system they have been working on, but informed patient that their strategy has not been particularly successful given persistently elevated blood pressures each time he has come to our clinic for the past 6 months.  He continues to deny any cardiac symptoms, but advised him to again follow-up with his managing physician and discuss medication adjustments or referral for second opinion.   Evelena Leyden PA-C 02/03/2023, 9:00 AM

## 2023-02-12 ENCOUNTER — Other Ambulatory Visit: Payer: Self-pay | Admitting: *Deleted

## 2023-02-12 DIAGNOSIS — I6521 Occlusion and stenosis of right carotid artery: Secondary | ICD-10-CM

## 2023-02-22 ENCOUNTER — Ambulatory Visit (HOSPITAL_COMMUNITY): Admission: RE | Admit: 2023-02-22 | Payer: Medicare Other | Source: Ambulatory Visit

## 2023-02-22 ENCOUNTER — Ambulatory Visit: Payer: Medicare Other

## 2023-03-03 ENCOUNTER — Ambulatory Visit (HOSPITAL_COMMUNITY)
Admission: RE | Admit: 2023-03-03 | Discharge: 2023-03-03 | Disposition: A | Discharge status: Home / Self Care | Payer: Medicare Other | Source: Ambulatory Visit | Attending: Surgery | Admitting: Surgery

## 2023-03-03 DIAGNOSIS — I6521 Occlusion and stenosis of right carotid artery: Secondary | ICD-10-CM | POA: Insufficient documentation

## 2023-03-04 DIAGNOSIS — Z23 Encounter for immunization: Secondary | ICD-10-CM | POA: Diagnosis not present

## 2023-03-18 NOTE — Progress Notes (Signed)
Referring Provider Assunta Found, MD [address] [place],  [place] [address]   CC:  Chief Complaint  Patient presents with   Follow-up      Robert Zhang is an 77 y.o. male.  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 Ulice Bold with a subsequent 5-FU treatment for residual [facility] who presents to clinic for postoperative follow-up.   He was last seen here in clinic on 02/03/2023.  At that time, he had discontinued the treatment a few days prior to completion of the full 4 weeks due to his skin cracking and bleeding.  However, he was overall quite pleased with the cosmesis and recovery.  Exam was benign.  Minimal persisting erythema, but improved compared to prior encounters.  Discussed case with Dr. Ulice Bold and plan was for continued watchful monitoring.  If there becomes concern for recurrence, could discuss additional reexcision.  Discussed with patient his markedly elevated blood pressures persistently throughout his postoperative period and have emphasized the importance of close outpatient follow-up with [facility] who are reportedly managing his hypertension.  Patient was understanding and agreeable.  Today, patient reports that he he has had some mild burning/itching at the excision site in the past few weeks and wanted to come in for evaluation.  He tells me that he has an authorization that is good until the end of next year for possible reexcision if needed.  He cannot tell if there is a new bump or not.  He is not sure one way or the other, but wanted to be checked.   Allergies  Allergen Reactions   Mirabegron     Pt states he doesn't have a reaction to this medication but can not take it because he takes hctz   Statins     Muscle pain   Tadalafil     Dropped blood pressure too low   Zetia [Ezetimibe]     Muscle pain   Penicillins Rash and Other (See  Comments)    Childhood allergy Has patient had a PCN reaction causing immediate rash, facial/tongue/throat swelling, SOB or lightheadedness with hypotension: yes Has patient had a PCN reaction causing severe rash involving mucus membranes or skin necrosis: no Has patient had a PCN reaction that required hospitalization no Has patient had a PCN reaction occurring within the last 10 years: no If all of the above answers are "NO", then may proceed with Cephalosporin use.      Outpatient Encounter Medications as of 03/19/2023  Medication Sig   aspirin 81 MG tablet Take 1 tablet (81 mg total) by mouth daily.   diclofenac Sodium (VOLTAREN) 1 % GEL Apply 2 g topically daily as needed (joint pain).   gabapentin (NEURONTIN) 300 MG capsule Take 300 mg by mouth 2 (two) times daily.   Lancets (ONETOUCH ULTRASOFT) lancets    losartan (COZAAR) 100 MG tablet Take 50 mg by mouth 2 (two) times daily.   naproxen (NAPROSYN) 500 MG tablet SMARTSIG:1 Tablet(s) By Mouth Every 12 Hours PRN   ONE TOUCH ULTRA TEST test strip    predniSONE (DELTASONE) 10 MG tablet Take by mouth.   pregabalin (LYRICA) 100 MG capsule Take 100 mg by mouth 3 (three) times daily.   tamsulosin (FLOMAX) 0.4 MG CAPS capsule Take 1 capsule (0.4 mg total) by mouth daily after supper.   [DISCONTINUED] naproxen (NAPROSYN) 250 MG tablet Take by mouth 2 (two) times  daily with a meal.   [DISCONTINUED] carboxymethylcellulose (REFRESH TEARS) 0.5 % SOLN Place 1 drop into both eyes 2 (two) times daily. (Patient not taking: Reported on 03/19/2023)   No facility-administered encounter medications on file as of 03/19/2023.     Past Medical History:  Diagnosis Date   Aortic atherosclerosis (HCC) 10/12/2017   Noted on CT    Arthritis    bilateral legs /   Neck-Degenerative Disc Disease   Carotid artery occlusion    Right Carotid    Cervical spondylosis    Cervical stenosis of spine    Degenerative disc disease, cervical    C4-7   Diabetes  mellitus without complication (HCC)    diet and exercise controlled, no medications, hgb A1C 07/31/2017 (6.9)   Erectile dysfunction 07/31/2020   External hemorrhoids    Hepatic cyst 10/12/2017   Low density lession right hepatic lobe, noted on CT   Hepatitis    History of colon polyps    Hypertension    Leg cramps    Prostate CA (HCC)    Protrusion of intervertebral disc of lumbosacral region    L5-S1   PTSD (post-traumatic stress disorder)    PTSD (post-traumatic stress disorder)    Pulmonary nodule 11/26/2016   2mm subpleural right lower lobe, noted on CT ABD/PELVIS   Right shoulder tendinitis    Sleep apnea    Stop Bang score of 5   Stroke (HCC) 2016   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    Past Surgical History:  Procedure Laterality Date   BROW LIFT Bilateral 09/17/2022   Procedure: Bilateral upper lid blepharoplasty and excision of right cheek cyst;  Surgeon: Peggye Form, DO;  Location: [facility];  Service: Plastics;  Laterality: Bilateral;   CATARACT EXTRACTION W/PHACO  07/20/2011   Procedure: CATARACT EXTRACTION PHACO AND INTRAOCULAR LENS PLACEMENT (IOC);  Surgeon: Susa Simmonds, MD;  [facility]: [facility];  Service: Ophthalmology;  Laterality: Right;  CDE=25.73   CATARACT EXTRACTION W/PHACO  08/31/2011   Procedure: CATARACT EXTRACTION PHACO AND INTRAOCULAR LENS PLACEMENT (IOC);  Surgeon: Susa Simmonds, MD;  Location: [facility];  Service: Ophthalmology;  Laterality: Left;  CDE: 38.59   COLONOSCOPY N/A 09/10/2016   Procedure: COLONOSCOPY;  Surgeon: Malissa Hippo, MD;  [facility];  Service: Endoscopy;  Laterality: N/A;  1200   COLONOSCOPY W/ POLYPECTOMY  2011   [facility]-Dr. Rehman   COLONOSCOPY WITH PROPOFOL N/A 10/15/2021   Procedure: COLONOSCOPY WITH PROPOFOL;  Surgeon: Malissa Hippo, MD;  [facility];  Service: Endoscopy;  Laterality: N/A;  1020   cortisone injection Left 01/13/2016   left shoulder    cortisone injection Left 09/21/2017   left shoulder   CYST EXCISION Right 09/17/2022   Procedure: CYST REMOVAL;  Surgeon: Peggye Form, DO;  Location: [facility];  Service: Plastics;  Laterality: Right;   CYSTOSCOPY  01/20/2018   Procedure: CYSTOSCOPY FLEXIBLE;  Surgeon: Malen Gauze, MD;  Location: [facility];  Service: Urology;;  no seeds found in bladder   CYSTOSCOPY  07/22/2022   Procedure: CYSTOSCOPY FLEXIBLE, FOLEY INSERTION, DILATION OF URETHRAL STRICTURE;  Surgeon: Milderd Meager., MD;  [facility];  Service: Urology;;   EYE SURGERY     laser surgery to repair retina tare   head reconstruction  1966   due to head injury in war   LESION REMOVAL Right 11/11/2022   Procedure: reexcision and  possible closure right cheek BCC;  Surgeon: Peggye Form, DO;  Location: [facility];  Service: Plastics;  Laterality: Right;   LIPOMA EXCISION Left 02/01/2019   Procedure: EXCISION LIPOMA LEFT UPPER EXTREMITY;  Surgeon: Franky Macho, MD;  [facility]: [facility];  Service: General;  Laterality: Left;   PROSTATE BIOPSY  05/20/2016   RADIOACTIVE SEED IMPLANT N/A 01/20/2018   Procedure: RADIOACTIVE SEED IMPLANT/BRACHYTHERAPY IMPLANT;  Surgeon: Malen Gauze, MD;  Location: [facility];  Service: Urology;  Laterality: N/A;     88    seeds implanted   REFRACTIVE SURGERY Left 03/05/2017   to resolve cloudiness   SPACE OAR INSTILLATION N/A 01/20/2018   Procedure: SPACE OAR INSTILLATION;  Surgeon: Malen Gauze, MD;  Location: [facility];  Service: Urology;  Laterality: N/A;   TRIGGER FINGER RELEASE      Family History  Problem Relation Age of Onset   Diabetes Mother    Hypertension Father    Heart disease Father        before age 22   Heart attack Father    Pancreatic cancer Brother    Anesthesia problems Neg Hx    Hypotension Neg Hx    Malignant hyperthermia Neg Hx    Pseudochol deficiency  Neg Hx     Social History   Social History Narrative   Not on file     Review of Systems General: Denies fevers or chills Skin: Endorses mild burning/irritation at the excision site  Physical Exam    03/19/2023    9:32 AM 03/19/2023    9:31 AM 02/03/2023    8:44 AM  Vitals with BMI  Systolic 193 204 540  Diastolic 47 104 92  Pulse  82 95    General:  No acute distress, nontoxic appearing  Respiratory: No increased work of breathing Neuro: Alert and oriented Psychiatric: Normal mood and affect   Assessment/Plan  Right cheek BCC excision:  Patient is concerned for possible recurrence.  He had BCC excised earlier this year and then reexcised 11/11/2022 Dr. Ulice Bold.  Afterwards, there was still positive margins and he was treated with 5-FU topical cream.    When looking at the photos obtained, no obvious new masses or lesions noted.  Similar contour to how he was after last reexcision and closure.  As for the mild burning and itching, he states that he did experience this during the 5-FU treatment as well.  He still has some mild residual erythema, but significantly improved compared to previous encounter.  Discussed case with Dr. Ulice Bold.  She would be willing to reexcised, but agrees that today's exam is benign.  Plan for him to follow-up with our office in 6 weeks for reevaluation and if he really would like reexcision, we can arrange that for him.  Patient's blood pressure was again markedly elevated here in the clinic today.  He states that he sees a Dr. Willa Rough with the [place] who has been managing his blood pressure, but he states that she does not really know how to improve his control.  Discussed referral to a cardiologist or to get a second opinion.  He understands the risks of persistently elevated blood pressures.  He continues to deny any symptoms here today and states that he "feels great".   Evelena Leyden PA-C 03/19/2023, 12:23 PM

## 2023-03-19 ENCOUNTER — Ambulatory Visit (INDEPENDENT_AMBULATORY_CARE_PROVIDER_SITE_OTHER): Payer: No Typology Code available for payment source | Admitting: Physician Assistant

## 2023-03-19 VITALS — BP 193/47 | HR 82

## 2023-03-19 DIAGNOSIS — C44319 Basal cell carcinoma of skin of other parts of face: Secondary | ICD-10-CM

## 2023-03-19 DIAGNOSIS — C4491 Basal cell carcinoma of skin, unspecified: Secondary | ICD-10-CM

## 2023-03-22 ENCOUNTER — Ambulatory Visit: Payer: Medicare Other | Admitting: Physician Assistant

## 2023-03-22 VITALS — BP 186/89 | HR 86 | Temp 97.8°F | Ht 68.0 in | Wt 170.3 lb

## 2023-03-22 DIAGNOSIS — I6521 Occlusion and stenosis of right carotid artery: Secondary | ICD-10-CM

## 2023-03-22 NOTE — Progress Notes (Unsigned)
Office Note   History of Present Illness   Robert Zhang is a 77 y.o. (1945/08/10) male who presents for surveillance of carotid artery stenosis.  He has a remote history of TIA with left-sided facial weakness and left leg weakness, which was unrelated to his carotid stenosis.  He has a known history of right ICA stenosis around 60%.  The patient returns today for follow up. The patient also denies any recent strokelike symptoms such as slurred speech, facial droop, sudden visual changes, or sudden weakness/numbness.  He takes a daily baby aspirin.  He has an intolerance to statins.  Current Outpatient Medications  Medication Sig Dispense Refill   aspirin 81 MG tablet Take 1 tablet (81 mg total) by mouth daily. 30 tablet    diclofenac Sodium (VOLTAREN) 1 % GEL Apply 2 g topically daily as needed (joint pain).     gabapentin (NEURONTIN) 300 MG capsule Take 300 mg by mouth 2 (two) times daily.     Lancets (ONETOUCH ULTRASOFT) lancets      losartan (COZAAR) 100 MG tablet Take 50 mg by mouth 2 (two) times daily.     naproxen (NAPROSYN) 500 MG tablet SMARTSIG:1 Tablet(s) By Mouth Every 12 Hours PRN     ONE TOUCH ULTRA TEST test strip      predniSONE (DELTASONE) 10 MG tablet Take by mouth.     pregabalin (LYRICA) 100 MG capsule Take 100 mg by mouth 3 (three) times daily.     tamsulosin (FLOMAX) 0.4 MG CAPS capsule Take 1 capsule (0.4 mg total) by mouth daily after supper. 90 capsule 3   No current facility-administered medications for this visit.    REVIEW OF SYSTEMS (negative unless checked):   Cardiac:  []  Chest pain or chest pressure? []  Shortness of breath upon activity? []  Shortness of breath when lying flat? []  Irregular heart rhythm?  Vascular:  []  Pain in calf, thigh, or hip brought on by walking? []  Pain in feet at night that wakes you up from your sleep? []  Blood clot in your veins? []  Leg swelling?  Pulmonary:  []  Oxygen at home? []  Productive cough? []   Wheezing?  Neurologic:  []  Sudden weakness in arms or legs? []  Sudden numbness in arms or legs? []  Sudden onset of difficult speaking or slurred speech? []  Temporary loss of vision in one eye? []  Problems with dizziness?  Gastrointestinal:  []  Blood in stool? []  Vomited blood?  Genitourinary:  []  Burning when urinating? []  Blood in urine?  Psychiatric:  []  Major depression  Hematologic:  []  Bleeding problems? []  Problems with blood clotting?  Dermatologic:  []  Rashes or ulcers?  Constitutional:  []  Fever or chills?  Ear/Nose/Throat:  []  Change in hearing? []  Nose bleeds? []  Sore throat?  Musculoskeletal:  []  Back pain? []  Joint pain? []  Muscle pain?   Physical Examination   Vitals:   03/22/23 1046  BP: (!) 186/89  Pulse: 86  Temp: 97.8 F (36.6 C)  SpO2: 98%  Weight: 170 lb 4.8 oz (77.2 kg)  Height: 5\' 8"  (1.727 m)   Body mass index is 25.89 kg/m.  General:  WDWN in NAD; vital signs documented above Gait: Not observed HENT: WNL, normocephalic Pulmonary: normal non-labored breathing , without rales, rhonchi,  wheezing Cardiac: regular Abdomen: soft, NT, no masses Skin: without rashes Vascular Exam/Pulses: palpable radial pulses bilaterally Extremities: without ischemic changes, without gangrene , without cellulitis; without open wounds;  Musculoskeletal: no muscle wasting or atrophy  Neurologic: A&O X  3;  No focal weakness or paresthesias are detected Psychiatric:  The pt has Normal affect.  Non-Invasive Vascular Imaging   Bilateral Carotid Duplex (03/04/2023):  R ICA stenosis:  40-59% R VA:  patent and antegrade L ICA stenosis:  1-39% L VA:  patent and antegrade   Medical Decision Making   Robert Zhang is a 77 y.o. male who presents for surveillance of carotid artery stenosis  Based on the patient's vascular studies, their carotid artery stenosis is unchanged bilaterally.  His right carotid stenosis is around 60%.  His left carotid  stenosis is 1 to 39% He denies any strokelike symptoms such as slurred speech, facial droop, sudden visual changes, or sudden weakness/numbness. He has palpable and equal radial pulses on exam.  He has no neuro deficits He should continue his daily aspirin and follow-up with our office in 1 year with repeat carotid duplex   Robert Dubonnet PA-C [facility] Office: [phone]  Clinic MD: Myra Gianotti

## 2023-04-08 ENCOUNTER — Other Ambulatory Visit: Payer: Self-pay

## 2023-04-08 DIAGNOSIS — I6521 Occlusion and stenosis of right carotid artery: Secondary | ICD-10-CM

## 2023-04-21 ENCOUNTER — Other Ambulatory Visit (HOSPITAL_COMMUNITY): Payer: Self-pay | Admitting: Otolaryngology

## 2023-04-21 DIAGNOSIS — H903 Sensorineural hearing loss, bilateral: Secondary | ICD-10-CM

## 2023-04-29 ENCOUNTER — Ambulatory Visit (HOSPITAL_COMMUNITY)
Admission: RE | Admit: 2023-04-29 | Discharge: 2023-04-29 | Disposition: A | Discharge status: Home / Self Care | Payer: No Typology Code available for payment source | Source: Ambulatory Visit | Attending: Otolaryngology | Admitting: Otolaryngology

## 2023-04-29 DIAGNOSIS — H903 Sensorineural hearing loss, bilateral: Secondary | ICD-10-CM | POA: Insufficient documentation

## 2023-04-29 MED ORDER — GADOBUTROL 1 MMOL/ML IV SOLN
8.0000 mL | Freq: Once | INTRAVENOUS | Status: AC | PRN
  Administered 2023-04-29: 8 mL via INTRAVENOUS

## 2023-04-30 ENCOUNTER — Ambulatory Visit (INDEPENDENT_AMBULATORY_CARE_PROVIDER_SITE_OTHER): Payer: No Typology Code available for payment source | Admitting: Physician Assistant

## 2023-04-30 VITALS — BP 196/93 | HR 70

## 2023-04-30 DIAGNOSIS — C44319 Basal cell carcinoma of skin of other parts of face: Secondary | ICD-10-CM | POA: Diagnosis not present

## 2023-04-30 DIAGNOSIS — C4491 Basal cell carcinoma of skin, unspecified: Secondary | ICD-10-CM

## 2023-04-30 NOTE — Progress Notes (Signed)
Referring Provider Assunta Found, MD [address] [place],  [place] [address]   CC:  Chief Complaint  Patient presents with   Follow-up      Robert Zhang is an 77 y.o. male.  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 Ulice Bold with a subsequent 5-FU treatment for residual [facility] who presents to clinic for postoperative follow-up.   Patient was last seen here in clinic on 03/19/2023.  At that time, he reported some mild burning/itching at the excision site and wanted to be evaluated.  Exam was benign.  Similar contour to how it was after reexcision and closure.  Suspect that the burning/itching could be related to the 5-FU treatment.  Erythema had improved considerably.  Discussed case with Dr. Ulice Bold and plan was for him to follow-up in 6 weeks.  If at that time he wants to proceed with reexcision, it can be arranged.  Otherwise continue watchful waiting.  Today, patient is doing well.  He states that he had continued to experience mild burning/itching sensation at the excision site, but he has not had any of those symptoms for the past week.  He does admit that the redness has improved.  He tells me that his authorization expires in April so he would like to have decision before that about moving forward with excision versus continued watchful waiting.  He reads a lot on the Internet and that is why he remains concerned.  However, he knowledges that the 5-FU may have treated the minute foci suspicious for minimal residual BCC of superficial dermis.   Allergies  Allergen Reactions   Mirabegron     Pt states he doesn't have a reaction to this medication but can not take it because he takes hctz   Statins     Muscle pain   Tadalafil     Dropped blood pressure too low   Zetia [Ezetimibe]     Muscle pain   Penicillins Rash and Other (See Comments)    Childhood allergy Has  patient had a PCN reaction causing immediate rash, facial/tongue/throat swelling, SOB or lightheadedness with hypotension: yes Has patient had a PCN reaction causing severe rash involving mucus membranes or skin necrosis: no Has patient had a PCN reaction that required hospitalization no Has patient had a PCN reaction occurring within the last 10 years: no If all of the above answers are "NO", then may proceed with Cephalosporin use.      Outpatient Encounter Medications as of 04/30/2023  Medication Sig   aspirin 81 MG tablet Take 1 tablet (81 mg total) by mouth daily.   diclofenac Sodium (VOLTAREN) 1 % GEL Apply 2 g topically daily as needed (joint pain).   gabapentin (NEURONTIN) 300 MG capsule Take 300 mg by mouth 2 (two) times daily.   Lancets (ONETOUCH ULTRASOFT) lancets    losartan (COZAAR) 100 MG tablet Take 50 mg by mouth 2 (two) times daily.   naproxen (NAPROSYN) 500 MG tablet SMARTSIG:1 Tablet(s) By Mouth Every 12 Hours PRN   ONE TOUCH ULTRA TEST test strip    predniSONE (DELTASONE) 10 MG tablet Take by mouth.   pregabalin (LYRICA) 100 MG capsule Take 100 mg by mouth 3 (three) times daily.   tamsulosin (FLOMAX) 0.4 MG CAPS capsule Take 1 capsule (0.4 mg total) by mouth daily after supper.   No facility-administered encounter medications on file as of 04/30/2023.  Past Medical History:  Diagnosis Date   Aortic atherosclerosis (HCC) 10/12/2017   Noted on CT    Arthritis    bilateral legs /   Neck-Degenerative Disc Disease   Carotid artery occlusion    Right Carotid    Cervical spondylosis    Cervical stenosis of spine    Degenerative disc disease, cervical    C4-7   Diabetes mellitus without complication (HCC)    diet and exercise controlled, no medications, hgb A1C 07/31/2017 (6.9)   Erectile dysfunction 07/31/2020   External hemorrhoids    Hepatic cyst 10/12/2017   Low density lession right hepatic lobe, noted on CT   Hepatitis    History of colon polyps     Hypertension    Leg cramps    Prostate CA (HCC)    Protrusion of intervertebral disc of lumbosacral region    L5-S1   PTSD (post-traumatic stress disorder)    PTSD (post-traumatic stress disorder)    Pulmonary nodule 11/26/2016   2mm subpleural right lower lobe, noted on CT ABD/PELVIS   Right shoulder tendinitis    Sleep apnea    Stop Bang score of 5   Stroke (HCC) 2016   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    Past Surgical History:  Procedure Laterality Date   BROW LIFT Bilateral 09/17/2022   Procedure: Bilateral upper lid blepharoplasty and excision of right cheek cyst;  Surgeon: Peggye Form, DO;  Location: [facility];  Service: Plastics;  Laterality: Bilateral;   CATARACT EXTRACTION W/PHACO  07/20/2011   Procedure: CATARACT EXTRACTION PHACO AND INTRAOCULAR LENS PLACEMENT (IOC);  Surgeon: Susa Simmonds, MD;  [facility];  Service: Ophthalmology;  Laterality: Right;  CDE=25.73   CATARACT EXTRACTION W/PHACO  08/31/2011   Procedure: CATARACT EXTRACTION PHACO AND INTRAOCULAR LENS PLACEMENT (IOC);  Surgeon: Susa Simmonds, MD;  [facility];  Service: Ophthalmology;  Laterality: Left;  CDE: 38.59   COLONOSCOPY N/A 09/10/2016   Procedure: COLONOSCOPY;  Surgeon: Malissa Hippo, MD;  [facility];  Service: Endoscopy;  Laterality: N/A;  1200   COLONOSCOPY W/ POLYPECTOMY  2011   [facility]-Dr. Rehman   COLONOSCOPY WITH PROPOFOL N/A 10/15/2021   Procedure: COLONOSCOPY WITH PROPOFOL;  Surgeon: Malissa Hippo, MD;  [facility];  Service: Endoscopy;  Laterality: N/A;  1020   cortisone injection Left 01/13/2016   left shoulder   cortisone injection Left 09/21/2017   left shoulder   CYST EXCISION Right 09/17/2022   Procedure: CYST REMOVAL;  Surgeon: Peggye Form, DO;  Location: [facility];  Service: Plastics;  Laterality: Right;   CYSTOSCOPY  01/20/2018   Procedure: CYSTOSCOPY FLEXIBLE;  Surgeon:  Malen Gauze, MD;  Location: [facility];  Service: Urology;;  no seeds found in bladder   CYSTOSCOPY  07/22/2022   Procedure: CYSTOSCOPY FLEXIBLE, FOLEY INSERTION, DILATION OF URETHRAL STRICTURE;  Surgeon: Milderd Meager., MD;  Location[facility];  Service: Urology;;   EYE SURGERY     laser surgery to repair retina tare   head reconstruction  1966   due to head injury in war   LESION REMOVAL Right 11/11/2022   Procedure: reexcision and possible closure right cheek BCC;  Surgeon: Peggye Form, DO;  Location: [facility];  Service: Plastics;  Laterality: Right;   LIPOMA EXCISION Left 02/01/2019   Procedure: EXCISION LIPOMA LEFT UPPER EXTREMITY;  Surgeon: Franky Macho, MD;  [facility];  Service: General;  Laterality: Left;   PROSTATE BIOPSY  05/20/2016   RADIOACTIVE SEED IMPLANT N/A 01/20/2018   Procedure: RADIOACTIVE SEED IMPLANT/BRACHYTHERAPY IMPLANT;  Surgeon: Malen Gauze, MD;  Location: [facility];  Service: Urology;  Laterality: N/A;     88    seeds implanted   REFRACTIVE SURGERY Left 03/05/2017   to resolve cloudiness   SPACE OAR INSTILLATION N/A 01/20/2018   Procedure: SPACE OAR INSTILLATION;  Surgeon: Malen Gauze, MD;  Location: [facility];  Service: Urology;  Laterality: N/A;   TRIGGER FINGER RELEASE      Family History  Problem Relation Age of Onset   Diabetes Mother    Hypertension Father    Heart disease Father        before age 64   Heart attack Father    Pancreatic cancer Brother    Anesthesia problems Neg Hx    Hypotension Neg Hx    Malignant hyperthermia Neg Hx    Pseudochol deficiency Neg Hx     Social History   Social History Narrative   Not on file     Review of Systems General: Denies fevers or chills Cardio: Denies chest pain, dyspnea, lightheadedness, nausea, back pain, blurred vision Pulmonary: Denies difficulty breathing  Physical Exam    03/22/2023    10:46 AM 03/19/2023    9:32 AM 03/19/2023    9:31 AM  Vitals with BMI  Height 5\' 8"     Weight 170 lbs 5 oz    BMI 25.9    Systolic 186 193 161  Diastolic 89 47 104  Pulse 86  82    General:  No acute distress, nontoxic appearing  Respiratory: No increased work of breathing Neuro: Alert and oriented Psychiatric: Normal mood and affect  Skin: Minimal scarring.  No bumps or lesions.  Skin looks healthy.  Significantly improved erythema since 5-FU treatment.  Assessment/Plan  Right BCC excision:  Physical exam is entirely reassuring.  The pathology from most recent excision 11/11/2022 did demonstrate a minute foci suspicious for residual BCC of superficial dermis.  This was prior to treatment with 5-FU.  Dr. Ulice Bold remains willing to reexcised if patient wants, but he does state that he is doing better.  He is uncertain.  He would like to follow-up in 8 weeks and will have his decision at that time.  Watchful waiting seems to be a reasonable conservative management, but patient does seem a bit anxious about possible residual BCC.  Patient's BP was markedly elevated in clinic again today, consistent with all prior encounters.  He is asymptomatic.  Again emphasized the critical importance of following up with his primary team and exploring referral to a cardiologist or have any second opinion.  He tells me that these are his blood pressures at home, as well.  Discussed the risks of persistently elevated blood pressures and he voices understanding.  [facility] on Tuesday and I have again emphasized the importance of having his uncontrolled hypertension.  Evelena Leyden PA-C 04/30/2023, 9:17 AM

## 2023-05-18 ENCOUNTER — Telehealth: Payer: Self-pay

## 2023-05-18 NOTE — Telephone Encounter (Signed)
 Patient had called and left a voice message on the triage line stating that he had a recent MRI of the brain that showed he had a bleed on his brain and he was wondering if he should come off his Aspirin. I attempted to call patient back however he had stepped out per his wife and would not return until after 12:30pm.   Reviewed pt's chart, returned call for clarification, two identifiers used. I spoke with patient after reviewing chart and what he had said on the message with Corey,PA. We are advising patient that he can stop the Aspirin for a short time if that is what the neurologist or whoever he is seeing that ordered the MRI wants him to do. We would like him to be on the aspirin due to his carotid stenosis, however if he needs to have a procedure done or if they are advising otherwise, it is okay for him to be off from it for a short interval. He should continue taking a statin in the interim. Patient will call the neurology office and follow up with them. I told him if he has any further questions to call us  back. Joane also would like patient to follow up with us  in 6 months for his carotid studies and visit instead of one year. I have updated this in the recall status for the patient and let him know this also.

## 2023-06-04 DIAGNOSIS — M48062 Spinal stenosis, lumbar region with neurogenic claudication: Secondary | ICD-10-CM | POA: Diagnosis not present

## 2023-06-04 DIAGNOSIS — I639 Cerebral infarction, unspecified: Secondary | ICD-10-CM | POA: Diagnosis not present

## 2023-06-04 DIAGNOSIS — G5621 Lesion of ulnar nerve, right upper limb: Secondary | ICD-10-CM | POA: Diagnosis not present

## 2023-06-04 DIAGNOSIS — Z981 Arthrodesis status: Secondary | ICD-10-CM | POA: Diagnosis not present

## 2023-06-28 ENCOUNTER — Ambulatory Visit: Payer: No Typology Code available for payment source | Admitting: Physician Assistant

## 2023-08-19 DIAGNOSIS — Z0001 Encounter for general adult medical examination with abnormal findings: Secondary | ICD-10-CM | POA: Diagnosis not present

## 2023-08-19 DIAGNOSIS — E0590 Thyrotoxicosis, unspecified without thyrotoxic crisis or storm: Secondary | ICD-10-CM | POA: Diagnosis not present

## 2023-08-19 DIAGNOSIS — E1140 Type 2 diabetes mellitus with diabetic neuropathy, unspecified: Secondary | ICD-10-CM | POA: Diagnosis not present

## 2023-08-19 DIAGNOSIS — I10 Essential (primary) hypertension: Secondary | ICD-10-CM | POA: Diagnosis not present

## 2023-09-24 ENCOUNTER — Other Ambulatory Visit: Payer: Self-pay | Admitting: *Deleted

## 2023-09-24 DIAGNOSIS — I6521 Occlusion and stenosis of right carotid artery: Secondary | ICD-10-CM

## 2023-10-04 ENCOUNTER — Encounter: Payer: Self-pay | Admitting: Physician Assistant

## 2023-10-04 ENCOUNTER — Ambulatory Visit (HOSPITAL_COMMUNITY)
Admission: RE | Admit: 2023-10-04 | Discharge: 2023-10-04 | Disposition: A | Discharge status: Home / Self Care | Source: Ambulatory Visit | Attending: Surgery | Admitting: Surgery

## 2023-10-04 ENCOUNTER — Ambulatory Visit: Admitting: Physician Assistant

## 2023-10-04 VITALS — BP 146/81 | HR 72 | Temp 98.0°F | Wt 168.3 lb

## 2023-10-04 DIAGNOSIS — I6521 Occlusion and stenosis of right carotid artery: Secondary | ICD-10-CM | POA: Diagnosis not present

## 2023-10-06 NOTE — Progress Notes (Signed)
 Office Note   History of Present Illness   Robert Zhang is a 78 y.o. (01/28/1946) male who presents for surveillance of carotid artery stenosis.  He has a remote history of TIA with left-sided facial weakness and left leg weakness, which was unrelated to his carotid stenosis. He has a known history of right ICA stenosis around 60%.   He returns today for follow up. He denies any recent strokelike symptoms such as slurred speech, facial droop, amaurosis fugax, or sudden weakness/numbness. He says he had a recent MRI which demonstrated a small, chronic left basal ganglia hemorrhage. He has seen a neurologist about this and they believe this is likely from a prior stroke. His neurologist is okay with him continuing a daily aspirin.   Current Outpatient Medications  Medication Sig Dispense Refill   amLODipine  (NORVASC ) 10 MG tablet Take 10 mg by mouth.     aspirin 81 MG tablet Take 1 tablet (81 mg total) by mouth daily. 30 tablet    diclofenac Sodium (VOLTAREN) 1 % GEL Apply 2 g topically daily as needed (joint pain).     gabapentin  (NEURONTIN ) 300 MG capsule Take 300 mg by mouth 2 (two) times daily.     Lancets (ONETOUCH ULTRASOFT) lancets      losartan  (COZAAR ) 100 MG tablet Take 50 mg by mouth 2 (two) times daily.     naproxen (NAPROSYN) 500 MG tablet SMARTSIG:1 Tablet(s) By Mouth Every 12 Hours PRN     ONE TOUCH ULTRA TEST test strip      predniSONE (DELTASONE) 10 MG tablet Take by mouth.     pregabalin (LYRICA) 100 MG capsule Take 100 mg by mouth 3 (three) times daily.     tamsulosin  (FLOMAX ) 0.4 MG CAPS capsule Take 1 capsule (0.4 mg total) by mouth daily after supper. 90 capsule 3   No current facility-administered medications for this visit.    REVIEW OF SYSTEMS (negative unless checked):   Cardiac:  []  Chest pain or chest pressure? []  Shortness of breath upon activity? []  Shortness of breath when lying flat? []  Irregular heart rhythm?  Vascular:  []  Pain in calf, thigh,  or hip brought on by walking? []  Pain in feet at night that wakes you up from your sleep? []  Blood clot in your veins? []  Leg swelling?  Pulmonary:  []  Oxygen at home? []  Productive cough? []  Wheezing?  Neurologic:  []  Sudden weakness in arms or legs? []  Sudden numbness in arms or legs? []  Sudden onset of difficult speaking or slurred speech? []  Temporary loss of vision in one eye? []  Problems with dizziness?  Gastrointestinal:  []  Blood in stool? []  Vomited blood?  Genitourinary:  []  Burning when urinating? []  Blood in urine?  Psychiatric:  []  Major depression  Hematologic:  []  Bleeding problems? []  Problems with blood clotting?  Dermatologic:  []  Rashes or ulcers?  Constitutional:  []  Fever or chills?  Ear/Nose/Throat:  []  Change in hearing? []  Nose bleeds? []  Sore throat?  Musculoskeletal:  []  Back pain? []  Joint pain? []  Muscle pain?   Physical Examination    Vitals:   10/04/23 1104 10/04/23 1107  BP: (!) 168/78 (!) 146/81  Pulse: 72   Temp: 98 F (36.7 C)   TempSrc: Temporal   SpO2: 96%   Weight: 168 lb 4.8 oz (76.3 kg)    Body mass index is 25.59 kg/m.  General:  WDWN in NAD; vital signs documented above Gait: Not observed HENT: WNL, normocephalic Pulmonary: normal  non-labored breathing , without rales, rhonchi,  wheezing Cardiac:regular Abdomen: soft, NT, no masses Skin: without rashes Vascular Exam/Pulses: palpable radial pulses bilaterally Extremities: without ischemic changes, without gangrene , without cellulitis; without open wounds;  Musculoskeletal: no muscle wasting or atrophy  Neurologic: A&O X 3;  No focal weakness or paresthesias are detected Psychiatric:  The pt has Normal affect.  Non-Invasive Vascular Imaging   Bilateral Carotid Duplex (10/04/2023):  R ICA stenosis:  1-39% R VA:  patent and antegrade L ICA stenosis:  normal L VA:  patent and antegrade   Medical Decision Making   Robert Zhang is a 78 y.o. male  who presents for surveillance of carotid artery stenosis  Based on the patient's vascular studies, his right ICA stenosis appears borderline between 1-39% and 40-59% stenosis. In 2023, his duplex demonstrated 60% right ICA stenosis He denies any strokelike symptoms such as slurred speech, facial droop, sudden visual changes, or sudden weakness/numbness.  On exam he has no neurological deficits. He has palpable and equal radial pulses bilaterally Given that his last 3 ultrasounds have demonstrated a maximum of right ICA 40-59% stenosis, we can plan for follow up in 1 year with repeat carotid duplex  Deneise Finlay PA-C [facility] Office: [phone]  Clinic MD: Charlotte Cookey

## 2023-12-14 ENCOUNTER — Other Ambulatory Visit (HOSPITAL_COMMUNITY): Payer: Self-pay | Admitting: Internal Medicine

## 2023-12-14 DIAGNOSIS — M545 Low back pain, unspecified: Secondary | ICD-10-CM

## 2023-12-20 ENCOUNTER — Ambulatory Visit (HOSPITAL_COMMUNITY)
Admission: RE | Admit: 2023-12-20 | Discharge: 2023-12-20 | Disposition: A | Discharge status: Home / Self Care | Source: Ambulatory Visit | Attending: Internal Medicine | Admitting: Internal Medicine

## 2023-12-20 DIAGNOSIS — M51369 Other intervertebral disc degeneration, lumbar region without mention of lumbar back pain or lower extremity pain: Secondary | ICD-10-CM

## 2023-12-20 DIAGNOSIS — M4807 Spinal stenosis, lumbosacral region: Secondary | ICD-10-CM | POA: Diagnosis not present

## 2023-12-20 DIAGNOSIS — M48061 Spinal stenosis, lumbar region without neurogenic claudication: Secondary | ICD-10-CM

## 2023-12-20 DIAGNOSIS — M5127 Other intervertebral disc displacement, lumbosacral region: Secondary | ICD-10-CM | POA: Diagnosis not present

## 2023-12-20 DIAGNOSIS — M47817 Spondylosis without myelopathy or radiculopathy, lumbosacral region: Secondary | ICD-10-CM

## 2023-12-20 DIAGNOSIS — G8929 Other chronic pain: Secondary | ICD-10-CM | POA: Diagnosis present

## 2023-12-20 DIAGNOSIS — M545 Low back pain, unspecified: Secondary | ICD-10-CM | POA: Diagnosis present

## 2023-12-20 DIAGNOSIS — M47816 Spondylosis without myelopathy or radiculopathy, lumbar region: Secondary | ICD-10-CM

## 2024-01-10 DIAGNOSIS — D2372 Other benign neoplasm of skin of left lower limb, including hip: Secondary | ICD-10-CM | POA: Diagnosis not present

## 2024-01-11 ENCOUNTER — Other Ambulatory Visit (HOSPITAL_COMMUNITY): Payer: Self-pay | Admitting: Orthopedic Surgery

## 2024-01-11 DIAGNOSIS — M25511 Pain in right shoulder: Secondary | ICD-10-CM

## 2024-01-12 ENCOUNTER — Ambulatory Visit (HOSPITAL_COMMUNITY)
Admission: RE | Admit: 2024-01-12 | Discharge: 2024-01-12 | Disposition: A | Discharge status: Home / Self Care | Source: Ambulatory Visit | Attending: Orthopedic Surgery | Admitting: Orthopedic Surgery

## 2024-01-12 DIAGNOSIS — M25511 Pain in right shoulder: Secondary | ICD-10-CM | POA: Insufficient documentation

## 2024-02-11 ENCOUNTER — Other Ambulatory Visit (HOSPITAL_COMMUNITY): Payer: Self-pay | Admitting: Neurosurgery

## 2024-02-11 DIAGNOSIS — M4316 Spondylolisthesis, lumbar region: Secondary | ICD-10-CM | POA: Diagnosis not present

## 2024-02-21 ENCOUNTER — Ambulatory Visit (HOSPITAL_COMMUNITY)
Admission: RE | Admit: 2024-02-21 | Discharge: 2024-02-21 | Disposition: A | Discharge status: Home / Self Care | Source: Ambulatory Visit | Attending: Neurosurgery | Admitting: Neurosurgery

## 2024-02-21 DIAGNOSIS — M4316 Spondylolisthesis, lumbar region: Secondary | ICD-10-CM | POA: Insufficient documentation

## 2024-03-06 ENCOUNTER — Other Ambulatory Visit: Payer: Self-pay | Admitting: Neurosurgery

## 2024-03-08 NOTE — Pre-Procedure Instructions (Addendum)
 Surgical Instructions   Your procedure is scheduled on March 13, 2024. Report to [facility] Main Entrance A at 9:00 A.M., then check in with the Admitting office. Any questions or running late day of surgery: call [phone]  Questions prior to your surgery date: call [phone], Monday-Friday, 8am-4pm. If you experience any cold or flu symptoms such as cough, fever, chills, shortness of breath, etc. between now and your scheduled surgery, please notify us  at the above number.     Remember:  Do not eat or drink after midnight the night before your surgery    Take these medicines the morning of surgery with A SIP OF WATER : amLODipine  (NORVASC )  gabapentin  (NEURONTIN )    May take these medicines IF NEEDED: carboxymethylcellulose (REFRESH PLUS) eye drops predniSONE (DELTASONE)    STOP taking your aspirin EC and diclofenac Sodium (VOLTAREN) GEL five days prior to surgery. DO NOT take any doses after October 21st.   One week prior to surgery, STOP taking any Aleve, Naproxen, Ibuprofen, Motrin, Advil, Goody's, BC's, all herbal medications, fish oil, and non-prescription vitamins.   HOW TO MANAGE YOUR DIABETES BEFORE AND AFTER SURGERY  Why is it important to control my blood sugar before and after surgery? Improving blood sugar levels before and after surgery helps healing and can limit problems. A way of improving blood sugar control is eating a healthy diet by:  Eating less sugar and carbohydrates  Increasing activity/exercise  Talking with your doctor about reaching your blood sugar goals High blood sugars (greater than 180 mg/dL) can raise your risk of infections and slow your recovery, so you will need to focus on controlling your diabetes during the weeks before surgery. Make sure that the doctor who takes care of your diabetes knows about your planned surgery including the date and location.  How do I manage my blood sugar before surgery? Check your blood sugar at  least 4 times a day, starting 2 days before surgery, to make sure that the level is not too high or low.  Check your blood sugar the morning of your surgery when you wake up and every 2 hours until you get to the Short Stay unit.  If your blood sugar is less than 70 mg/dL, you will need to treat for low blood sugar: Do not take insulin . Treat a low blood sugar (less than 70 mg/dL) with  cup of clear juice (cranberry or apple), 4 glucose tablets, OR glucose gel. Recheck blood sugar in 15 minutes after treatment (to make sure it is greater than 70 mg/dL). If your blood sugar is not greater than 70 mg/dL on recheck, call [phone] for further instructions. Report your blood sugar to the short stay nurse when you get to Short Stay.  If you are admitted to the hospital after surgery: Your blood sugar will be checked by the staff and you will probably be given insulin  after surgery (instead of oral diabetes medicines) to make sure you have good blood sugar levels. The goal for blood sugar control after surgery is 80-180 mg/dL.                      Do NOT Smoke (Tobacco/Vaping) for 24 hours prior to your procedure.  If you use a CPAP at night, you may bring your mask/headgear for your overnight stay.   You will be asked to remove any contacts, glasses, piercing's, hearing aid's, dentures/partials prior to surgery. Please bring cases for these items if needed.  Patients discharged the day of surgery will not be allowed to drive home, and someone needs to stay with them for 24 hours.  SURGICAL WAITING ROOM VISITATION Patients may have no more than 2 support people in the waiting area - these visitors may rotate.   Pre-op nurse will coordinate an appropriate time for 1 ADULT support person, who may not rotate, to accompany patient in pre-op.  Children under the age of 26 must have an adult with them who is not the patient and must remain in the main waiting area with an adult.  If the  patient needs to stay at the hospital during part of their recovery, the visitor guidelines for inpatient rooms apply.  Please refer to the [facility] website for the visitor guidelines for any additional information.   If you received a COVID test during your pre-op visit  it is requested that you wear a mask when out in public, stay away from anyone that may not be feeling well and notify your surgeon if you develop symptoms. If you have been in contact with anyone that has tested positive in the last 10 days please notify you surgeon.      Pre-operative 4 CHG Bathing Instructions   You can play a key role in reducing the risk of infection after surgery. Your skin needs to be as free of germs as possible. You can reduce the number of germs on your skin by washing with CHG (chlorhexidine  gluconate) soap before surgery. CHG is an antiseptic soap that kills germs and continues to kill germs even after washing.   DO NOT use if you have an allergy to chlorhexidine /CHG or antibacterial soaps. If your skin becomes reddened or irritated, stop using the CHG and notify one of our RNs at [phone].   Please shower with the CHG soap starting 4 days before surgery using the following schedule:     Please keep in mind the following:  DO NOT shave, including legs and underarms, starting the day of your first shower.   You may shave your face at any point before/day of surgery.  Place clean sheets on your bed the day you start using CHG soap. Use a clean washcloth (not used since being washed) for each shower. DO NOT sleep with pets once you start using the CHG.   CHG Shower Instructions:  Wash your face and private area with normal soap. If you choose to wash your hair, wash first with your normal shampoo.  After you use shampoo/soap, rinse your hair and body thoroughly to remove shampoo/soap residue.  Turn the water  OFF and apply  bottle of CHG soap to a CLEAN washcloth.  Apply CHG soap ONLY  FROM YOUR NECK DOWN TO YOUR TOES (washing for 3-5 minutes)  DO NOT use CHG soap on face, private areas, open wounds, or sores.  Pay special attention to the area where your surgery is being performed.  If you are having back surgery, having someone wash your back for you may be helpful. Wait 2 minutes after CHG soap is applied, then you may rinse off the CHG soap.  Pat dry with a clean towel  Put on clean clothes/pajamas   If you choose to wear lotion, please use ONLY the CHG-compatible lotions that are listed below.  Additional instructions for the day of surgery:  If you choose, you may shower the morning of surgery with an antibacterial soap.  DO NOT APPLY any lotions, deodorants, cologne, or perfumes.  Do not bring valuables to the hospital. [facility] is not responsible for any belongings/valuables. Do not wear nail polish, gel polish, artificial nails, or any other type of covering on natural nails (fingers and toes) Do not wear jewelry or makeup Put on clean/comfortable clothes.  Please brush your teeth.  Ask your nurse before applying any prescription medications to the skin.     CHG Compatible Lotions   Aveeno Moisturizing lotion  Cetaphil Moisturizing Cream  Cetaphil Moisturizing Lotion  Clairol Herbal Essence Moisturizing Lotion, Dry Skin  Clairol Herbal Essence Moisturizing Lotion, Extra Dry Skin  Clairol Herbal Essence Moisturizing Lotion, Normal Skin  Curel Age Defying Therapeutic Moisturizing Lotion with Alpha Hydroxy  Curel Extreme Care Body Lotion  Curel Soothing Hands Moisturizing Hand Lotion  Curel Therapeutic Moisturizing Cream, Fragrance-Free  Curel Therapeutic Moisturizing Lotion, Fragrance-Free  Curel Therapeutic Moisturizing Lotion, Original Formula  Eucerin Daily Replenishing Lotion  Eucerin Dry Skin Therapy Plus Alpha Hydroxy Crme  Eucerin Dry Skin Therapy Plus Alpha Hydroxy Lotion  Eucerin Original Crme  Eucerin Original Lotion  Eucerin Plus  Crme Eucerin Plus Lotion  Eucerin TriLipid Replenishing Lotion  Keri Anti-Bacterial Hand Lotion  Keri Deep Conditioning Original Lotion Dry Skin Formula Softly Scented  Keri Deep Conditioning Original Lotion, Fragrance Free Sensitive Skin Formula  Keri Lotion Fast Absorbing Fragrance Free Sensitive Skin Formula  Keri Lotion Fast Absorbing Softly Scented Dry Skin Formula  Keri Original Lotion  Keri Skin Renewal Lotion Keri Silky Smooth Lotion  Keri Silky Smooth Sensitive Skin Lotion  Nivea Body Creamy Conditioning Oil  Nivea Body Extra Enriched Lotion  Nivea Body Original Lotion  Nivea Body Sheer Moisturizing Lotion Nivea Crme  Nivea Skin Firming Lotion  NutraDerm 30 Skin Lotion  NutraDerm Skin Lotion  NutraDerm Therapeutic Skin Cream  NutraDerm Therapeutic Skin Lotion  ProShield Protective Hand Cream  Provon moisturizing lotion  Please read over the following fact sheets that you were given.

## 2024-03-09 ENCOUNTER — Encounter (HOSPITAL_COMMUNITY)
Admission: RE | Admit: 2024-03-09 | Discharge: 2024-03-09 | Disposition: A | Discharge status: Home / Self Care | Source: Ambulatory Visit | Attending: Neurosurgery | Admitting: Neurosurgery

## 2024-03-09 ENCOUNTER — Encounter (HOSPITAL_COMMUNITY): Payer: Self-pay

## 2024-03-09 ENCOUNTER — Other Ambulatory Visit: Payer: Self-pay

## 2024-03-09 VITALS — BP 162/86 | HR 75 | Temp 97.8°F | Resp 16

## 2024-03-09 DIAGNOSIS — Z01812 Encounter for preprocedural laboratory examination: Secondary | ICD-10-CM | POA: Insufficient documentation

## 2024-03-09 DIAGNOSIS — K769 Liver disease, unspecified: Secondary | ICD-10-CM | POA: Insufficient documentation

## 2024-03-09 DIAGNOSIS — Z8546 Personal history of malignant neoplasm of prostate: Secondary | ICD-10-CM | POA: Insufficient documentation

## 2024-03-09 DIAGNOSIS — F4310 Post-traumatic stress disorder, unspecified: Secondary | ICD-10-CM | POA: Diagnosis not present

## 2024-03-09 DIAGNOSIS — S32009K Unspecified fracture of unspecified lumbar vertebra, subsequent encounter for fracture with nonunion: Secondary | ICD-10-CM | POA: Insufficient documentation

## 2024-03-09 DIAGNOSIS — X58XXXD Exposure to other specified factors, subsequent encounter: Secondary | ICD-10-CM | POA: Insufficient documentation

## 2024-03-09 DIAGNOSIS — Z01818 Encounter for other preprocedural examination: Secondary | ICD-10-CM

## 2024-03-09 DIAGNOSIS — Z85828 Personal history of other malignant neoplasm of skin: Secondary | ICD-10-CM | POA: Diagnosis not present

## 2024-03-09 DIAGNOSIS — Z87891 Personal history of nicotine dependence: Secondary | ICD-10-CM | POA: Diagnosis not present

## 2024-03-09 DIAGNOSIS — I10 Essential (primary) hypertension: Secondary | ICD-10-CM | POA: Insufficient documentation

## 2024-03-09 DIAGNOSIS — E119 Type 2 diabetes mellitus without complications: Secondary | ICD-10-CM

## 2024-03-09 DIAGNOSIS — E1142 Type 2 diabetes mellitus with diabetic polyneuropathy: Secondary | ICD-10-CM | POA: Diagnosis not present

## 2024-03-09 DIAGNOSIS — Z8673 Personal history of transient ischemic attack (TIA), and cerebral infarction without residual deficits: Secondary | ICD-10-CM | POA: Diagnosis not present

## 2024-03-09 HISTORY — DX: Anxiety disorder, unspecified: F41.9

## 2024-03-09 HISTORY — DX: Basal cell carcinoma of skin, unspecified: C44.91

## 2024-03-09 HISTORY — DX: Myoneural disorder, unspecified: G70.9

## 2024-03-09 HISTORY — DX: Peripheral vascular disease, unspecified: I73.9

## 2024-03-09 LAB — COMPREHENSIVE METABOLIC PANEL WITH GFR
ALT: 21 U/L (ref 0–44)
AST: 20 U/L (ref 15–41)
Albumin: 3.9 g/dL (ref 3.5–5.0)
Alkaline Phosphatase: 78 U/L (ref 38–126)
Anion gap: 12 (ref 5–15)
BUN: 16 mg/dL (ref 8–23)
CO2: 25 mmol/L (ref 22–32)
Calcium: 9.2 mg/dL (ref 8.9–10.3)
Chloride: 102 mmol/L (ref 98–111)
Creatinine, Ser: 0.93 mg/dL (ref 0.61–1.24)
GFR, Estimated: >60 mL/min (ref >60)
Glucose, Bld: 152 mg/dL — ABNORMAL HIGH (ref 70–99)
Potassium: 3.8 mmol/L (ref 3.5–5.1)
Sodium: 139 mmol/L (ref 135–145)
Total Bilirubin: 0.8 mg/dL (ref 0.0–1.2)
Total Protein: 6.8 g/dL (ref 6.5–8.1)

## 2024-03-09 LAB — CBC
HCT: 47.1 % (ref 39.0–52.0)
Hemoglobin: 15.4 g/dL (ref 13.0–17.0)
MCH: 30.5 pg (ref 26.0–34.0)
MCHC: 32.7 g/dL (ref 30.0–36.0)
MCV: 93.3 fL (ref 80.0–100.0)
Platelets: 211 K/uL (ref 150–400)
RBC: 5.05 MIL/uL (ref 4.22–5.81)
RDW: 13.2 % (ref 11.5–15.5)
WBC: 8.7 K/uL (ref 4.0–10.5)
nRBC: 0 % (ref 0.0–0.2)

## 2024-03-09 LAB — TYPE AND SCREEN
ABO/RH(D): A POS
Antibody Screen: NEGATIVE

## 2024-03-09 LAB — SURGICAL PCR SCREEN
MRSA, PCR: NEGATIVE
Staphylococcus aureus: NEGATIVE

## 2024-03-09 LAB — HEMOGLOBIN A1C
Hgb A1c MFr Bld: 7.1 % — ABNORMAL HIGH (ref 4.8–5.6)
Mean Plasma Glucose: 157.07 mg/dL

## 2024-03-09 LAB — GLUCOSE, CAPILLARY: Glucose-Capillary: 155 mg/dL — ABNORMAL HIGH (ref 70–99)

## 2024-03-09 NOTE — Progress Notes (Signed)
 PCP - Dr. Norleen General Cardiologist - Denies  PPM/ICD - Denies Device Orders - n/a Rep Notified - n/a  Chest x-ray - n/a EKG - 12/09/2023 - tracing in chart Stress Test - 12/22/2023 ([facility]) ECHO - 02/15/2024 ([facility]) Cardiac Cath - Denies  Sleep Study - Denies CPAP - n/a  Pt is DM2. He checks his blood sugar about 2-3x/month. Normal fasting is 110-130. CBG at pre-op appointment 155. A1c result pending.  Last dose of GLP1 agonist-  n/a GLP1 instructions: n/a   Blood Thinner Instructions: n/a Aspirin Instructions: Pt instructed to stop taking ASA five days prior to surgery. Last dose was 10/21  NPO after midnight  COVID TEST- n/a   Anesthesia review: Yes. Recent cardiac testing completed within the last 6 months, all available in [facility].  Patient denies shortness of breath, fever, cough and chest pain at PAT appointment. Pt denies any respiratory illness/infection in the last two months.   All instructions explained to the patient, with a verbal understanding of the material. Patient agrees to go over the instructions while at home for a better understanding. Patient also instructed to self quarantine after being tested for COVID-19. The opportunity to ask questions was provided.

## 2024-03-10 NOTE — Anesthesia Preprocedure Evaluation (Addendum)
 Anesthesia Evaluation  Patient identified by MRN, date of birth, ID band Patient awake    Reviewed: Allergy & Precautions, NPO status , Patient's Chart, lab work & pertinent test results  History of Anesthesia Complications Negative for: history of anesthetic complications  Airway Mallampati: II  TM Distance: >3 FB Neck ROM: Full    Dental  (+) Dental Advisory Given   Pulmonary former smoker   breath sounds clear to auscultation       Cardiovascular hypertension, Pt. on medications (-) angina + Peripheral Vascular Disease   Rhythm:Regular Rate:Normal  Echo 02/15/2024 [facility]): Interpretation Summary There is no comparison study available. Left ventricular systolic function is normal. EF= 56% by 2D biplane method. No regional wall motion abnormalities noted. Grade I diastolic dysfunction, (abnormal relaxation pattern). There is mild aortic sclerosis. No hemodynamically significant valvular aortic stenosis. There is trace mitral regurgitation. There is trace tricuspid regurgitation. Right ventricular systolic pressure unable to be evaluated due to insufficient TR.    Nuclear stress test 12/22/2023 [facility]): Impression: 1. Normal myocardial perfusion. There is no evidence of myocardial ischemia or  infarction. 2. Normal LV EF at 73%. Normal LV end-diastolic volumes. 3. Low-risk nuclear stress test. 4. There are no prior studies available for comparison.     Neuro/Psych   Anxiety     CVA, No Residual Symptoms    GI/Hepatic Neg liver ROS,GERD  Controlled and Medicated,,  Endo/Other  diabetes (glu 122, diet controlled)    Renal/GU negative Renal ROS   H/o prostate cancer    Musculoskeletal   Abdominal   Peds  Hematology Hb 15.4, plt 211k   Anesthesia Other Findings   Reproductive/Obstetrics                              Anesthesia Physical Anesthesia Plan  ASA: 3  Anesthesia  Plan: General   Post-op Pain Management: Tylenol  PO (pre-op)*   Induction: Intravenous  PONV Risk Score and Plan: 2 and Ondansetron  and Dexamethasone   Airway Management Planned: Oral ETT  Additional Equipment: None  Intra-op Plan:   Post-operative Plan: Extubation in OR  Informed Consent: I have reviewed the patients History and Physical, chart, labs and discussed the procedure including the risks, benefits and alternatives for the proposed anesthesia with the patient or authorized representative who has indicated his/her understanding and acceptance.     Dental advisory given  Plan Discussed with: CRNA and Surgeon  Anesthesia Plan Comments: (PAT note written 03/10/2024 by Allison Zelenak, PA-C.  )         Anesthesia Quick Evaluation

## 2024-03-10 NOTE — Progress Notes (Signed)
 Anesthesia Chart Review:   Case: [id number] Date/Time: 03/13/24 1052   Procedure: POSTERIOR LUMBAR FUSION 1 LEVEL - EXPLORE AND REVISE LUMBAR FUSION/INSTRUMENTATION L45   Anesthesia type: General   Diagnosis: Lumbar pseudoarthrosis [S32.009K]   Pre-op diagnosis: LUMBAR PSEUDOARTHROSIS   [facility] / [facility]   Surgeons: Mavis Purchase, MD       DISCUSSION: Patient is a 78 year old male scheduled for the above procedure.  History includes former smoker (quit 04/17/1976), HTN, carotid artery stenosis (1-39% 09/2023), CVA (2016), DM2, ED, peripheral neuropathy, PTSD, skin cancer (BCC,right cheek), spinal surgery (L4-5 PLIF 07/22/2022), head reconstruction (1966), prostate cancer (s/p I-125 seed implant 01/20/2018).  He had recent cardiac testing through the [facility] See below.   He reported last aspirin 03/07/2024.  Anesthesia team to evaluate on the day of surgery.  VS: BP (!) 162/86   Pulse 75   Temp 36.6 C   Resp 16   SpO2 100%   PROVIDERS: Marvine Rush, MD is PCP    LABS: Labs reviewed: Acceptable for surgery. (all labs ordered are listed, but only abnormal results are displayed)  Labs Reviewed  GLUCOSE, CAPILLARY - Abnormal; Notable for the following components:      Result Value   Glucose-Capillary 155 (*)    All other components within normal limits  HEMOGLOBIN A1C - Abnormal; Notable for the following components:   Hgb A1c MFr Bld 7.1 (*)    All other components within normal limits  COMPREHENSIVE METABOLIC PANEL WITH GFR - Abnormal; Notable for the following components:   Glucose, Bld 152 (*)    All other components within normal limits  SURGICAL PCR SCREEN  CBC  TYPE AND SCREEN     IMAGES: CT L-spine 02/21/2024: IMPRESSION: 1. Postoperative changes at L4-L5 with evidence of loosening of the right L4 and left L5 pedicle screws; and no convincing arthrodesis at this level. 2. advanced lumbar spine degeneration elsewhere stable to August MRI.   EKG:  EKG 12/09/2023 (on shadow chart): SR with occasional PVCs. Septal infarct (age undetermined).   CV: Echo 02/15/2024 [facility]): Interpretation Summary There is no comparison study available. Left ventricular systolic function is normal. EF= 56% by 2D biplane method. No regional wall motion abnormalities noted. Grade I diastolic dysfunction, (abnormal relaxation pattern). There is mild aortic sclerosis. No hemodynamically significant valvular aortic stenosis. There is trace mitral regurgitation. There is trace tricuspid regurgitation. Right ventricular systolic pressure unable to be evaluated due to insufficient TR.    Nuclear stress test 12/22/2023 [facility]): Impression: 1. Normal myocardial perfusion. There is no evidence of myocardial ischemia or  infarction. 2. Normal LV EF at 73%. Normal LV end-diastolic volumes. 3. Low-risk nuclear stress test. 4. There are no prior studies available for comparison.    US  Carotid 10/04/2023: R ICA stenosis: 1-39% R VA: patent and antegrade L ICA stenosis: normal L VA: patent and antegrade   Past Medical History:  Diagnosis Date   Anxiety    PTSD   Aortic atherosclerosis 10/12/2017   Noted on CT    Arthritis    bilateral legs /   Neck-Degenerative Disc Disease   Basal cell carcinoma    Right side of nose   Carotid artery occlusion    Right Carotid    Cervical spondylosis    Cervical stenosis of spine    Degenerative disc disease, cervical    C4-7   Diabetes mellitus without complication (HCC)    diet and exercise controlled, no medications, hgb A1C  07/31/2017 (6.9)   Erectile dysfunction 07/31/2020   External hemorrhoids    Hepatic cyst 10/12/2017   Low density lession right hepatic lobe, noted on CT   Hepatitis    History of colon polyps    Hypertension    Leg cramps    Neuromuscular disorder (HCC)    Neuropathy bilateral feet   Peripheral vascular disease    Right carotid Artery Stenosis   Prostate CA (HCC)    Protrusion  of intervertebral disc of lumbosacral region    L5-S1   Pulmonary nodule 11/26/2016   2mm subpleural right lower lobe, noted on CT ABD/PELVIS   Right shoulder tendinitis    Stroke [facility]) 2016   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    Past Surgical History:  Procedure Laterality Date   BROW LIFT Bilateral 09/17/2022   Procedure: Bilateral upper lid blepharoplasty and excision of right cheek cyst;  Surgeon: Lowery Estefana RAMAN, DO;  Location: [facility];  Service: Plastics;  Laterality: Bilateral;   CATARACT EXTRACTION W/PHACO  07/20/2011   Procedure: CATARACT EXTRACTION PHACO AND INTRAOCULAR LENS PLACEMENT (IOC);  Surgeon: Dow JULIANNA Burke, MD;  Location: [facility];  Service: Ophthalmology;  Laterality: Right;  CDE=25.73   CATARACT EXTRACTION W/PHACO  08/31/2011   Procedure: CATARACT EXTRACTION PHACO AND INTRAOCULAR LENS PLACEMENT (IOC);  Surgeon: Dow JULIANNA Burke, MD;  Location: [facility];  Service: Ophthalmology;  Laterality: Left;  CDE: 38.59   COLONOSCOPY N/A 09/10/2016   Procedure: COLONOSCOPY;  Surgeon: Claudis RAYMOND Rivet, MD;  [facility];  Service: Endoscopy;  Laterality: N/A;  1200   COLONOSCOPY W/ POLYPECTOMY  2011   [facility]-Dr. Rehman   COLONOSCOPY WITH PROPOFOL  N/A 10/15/2021   Procedure: COLONOSCOPY WITH PROPOFOL ;  Surgeon: Rivet Claudis RAYMOND, MD;  [facility];  Service: Endoscopy;  Laterality: N/A;  1020   cortisone injection Left 01/13/2016   left shoulder   cortisone injection Left 09/21/2017   left shoulder   CYST EXCISION Right 09/17/2022   Procedure: CYST REMOVAL;  Surgeon: Lowery Estefana RAMAN, DO;  Location: [facility];  Service: Plastics;  Laterality: Right;   CYSTOSCOPY  01/20/2018   Procedure: CYSTOSCOPY FLEXIBLE;  Surgeon: Sherrilee Belvie CROME, MD;  Location: [facility];  Service: Urology;;  no seeds found in bladder   CYSTOSCOPY  07/22/2022   Procedure: CYSTOSCOPY FLEXIBLE, FOLEY  INSERTION, DILATION OF URETHRAL STRICTURE;  Surgeon: Roseann Adine PARAS., MD;  [facility];  Service: Urology;;   EYE SURGERY     laser surgery to repair retina tare   head reconstruction  1966   due to head injury in war   LESION REMOVAL Right 11/11/2022   Procedure: reexcision and possible closure right cheek BCC;  Surgeon: Lowery Estefana RAMAN, DO;  Location: [facility];  Service: Plastics;  Laterality: Right;   LIPOMA EXCISION Left 02/01/2019   Procedure: EXCISION LIPOMA LEFT UPPER EXTREMITY;  Surgeon: Mavis Anes, MD;  Location: [facility];  Service: General;  Laterality: Left;   LUMBAR FUSION  07/2022   Dr. Reyes Mavis   PROSTATE BIOPSY  05/20/2016   RADIOACTIVE SEED IMPLANT N/A 01/20/2018   Procedure: RADIOACTIVE SEED IMPLANT/BRACHYTHERAPY IMPLANT;  Surgeon: Sherrilee Belvie CROME, MD;  Location: [facility];  Service: Urology;  Laterality: N/A;     86    seeds implanted   REFRACTIVE SURGERY Left 03/05/2017   to resolve cloudiness   SPACE OAR INSTILLATION N/A 01/20/2018  Procedure: SPACE OAR INSTILLATION;  Surgeon: Sherrilee Belvie CROME, MD;  Location: [facility];  Service: Urology;  Laterality: N/A;   TRIGGER FINGER RELEASE      MEDICATIONS:  amLODipine  (NORVASC ) 10 MG tablet   aspirin EC 81 MG tablet   atorvastatin (LIPITOR) 10 MG tablet   carboxymethylcellulose (REFRESH PLUS) 0.5 % SOLN   diclofenac Sodium (VOLTAREN) 1 % GEL   gabapentin  (NEURONTIN ) 300 MG capsule   Lancets (ONETOUCH ULTRASOFT) lancets   losartan  (COZAAR ) 100 MG tablet   ONE TOUCH ULTRA TEST test strip   predniSONE (DELTASONE) 10 MG tablet   tamsulosin  (FLOMAX ) 0.4 MG CAPS capsule   No current facility-administered medications for this encounter.   Prednisone is as needed for gout attacks.   Isaiah Ruder, PA-C Surgical Short Stay/Anesthesiology [facility] Phone [phone] [facility] Phone [phone] 03/10/2024 4:06 PM

## 2024-03-13 ENCOUNTER — Inpatient Hospital Stay (HOSPITAL_COMMUNITY)
Admission: RE | Admit: 2024-03-13 | Discharge: 2024-03-13 | DRG: 451 | Disposition: A | Discharge status: Home / Self Care | Attending: Neurosurgery | Admitting: Neurosurgery

## 2024-03-13 ENCOUNTER — Inpatient Hospital Stay (HOSPITAL_COMMUNITY)

## 2024-03-13 ENCOUNTER — Inpatient Hospital Stay (HOSPITAL_COMMUNITY): Payer: Self-pay | Admitting: Vascular Surgery

## 2024-03-13 ENCOUNTER — Inpatient Hospital Stay (HOSPITAL_COMMUNITY): Payer: Self-pay | Admitting: Anesthesiology

## 2024-03-13 ENCOUNTER — Encounter (HOSPITAL_COMMUNITY)
Admission: RE | Disposition: A | Discharge status: Home / Self Care | Payer: Self-pay | Source: Home / Self Care | Attending: Neurosurgery

## 2024-03-13 ENCOUNTER — Encounter (HOSPITAL_COMMUNITY): Payer: Self-pay | Admitting: Neurosurgery

## 2024-03-13 DIAGNOSIS — E1140 Type 2 diabetes mellitus with diabetic neuropathy, unspecified: Secondary | ICD-10-CM | POA: Diagnosis present

## 2024-03-13 DIAGNOSIS — T84038A Mechanical loosening of other internal prosthetic joint, initial encounter: Secondary | ICD-10-CM | POA: Diagnosis present

## 2024-03-13 DIAGNOSIS — Z8249 Family history of ischemic heart disease and other diseases of the circulatory system: Secondary | ICD-10-CM | POA: Diagnosis not present

## 2024-03-13 DIAGNOSIS — E1151 Type 2 diabetes mellitus with diabetic peripheral angiopathy without gangrene: Secondary | ICD-10-CM | POA: Diagnosis present

## 2024-03-13 DIAGNOSIS — Z8673 Personal history of transient ischemic attack (TIA), and cerebral infarction without residual deficits: Secondary | ICD-10-CM

## 2024-03-13 DIAGNOSIS — Z7982 Long term (current) use of aspirin: Secondary | ICD-10-CM | POA: Diagnosis not present

## 2024-03-13 DIAGNOSIS — Z79899 Other long term (current) drug therapy: Secondary | ICD-10-CM

## 2024-03-13 DIAGNOSIS — Z85828 Personal history of other malignant neoplasm of skin: Secondary | ICD-10-CM | POA: Diagnosis not present

## 2024-03-13 DIAGNOSIS — F4310 Post-traumatic stress disorder, unspecified: Secondary | ICD-10-CM | POA: Diagnosis present

## 2024-03-13 DIAGNOSIS — Z880 Allergy status to penicillin: Secondary | ICD-10-CM

## 2024-03-13 DIAGNOSIS — E119 Type 2 diabetes mellitus without complications: Secondary | ICD-10-CM

## 2024-03-13 DIAGNOSIS — F419 Anxiety disorder, unspecified: Secondary | ICD-10-CM

## 2024-03-13 DIAGNOSIS — S32009K Unspecified fracture of unspecified lumbar vertebra, subsequent encounter for fracture with nonunion: Principal | ICD-10-CM | POA: Diagnosis present

## 2024-03-13 DIAGNOSIS — R911 Solitary pulmonary nodule: Secondary | ICD-10-CM | POA: Diagnosis present

## 2024-03-13 DIAGNOSIS — Z961 Presence of intraocular lens: Secondary | ICD-10-CM | POA: Diagnosis present

## 2024-03-13 DIAGNOSIS — Z8546 Personal history of malignant neoplasm of prostate: Secondary | ICD-10-CM

## 2024-03-13 DIAGNOSIS — Z833 Family history of diabetes mellitus: Secondary | ICD-10-CM | POA: Diagnosis not present

## 2024-03-13 DIAGNOSIS — I10 Essential (primary) hypertension: Secondary | ICD-10-CM | POA: Diagnosis present

## 2024-03-13 DIAGNOSIS — M960 Pseudarthrosis after fusion or arthrodesis: Secondary | ICD-10-CM | POA: Diagnosis present

## 2024-03-13 DIAGNOSIS — Z888 Allergy status to other drugs, medicaments and biological substances status: Secondary | ICD-10-CM | POA: Diagnosis not present

## 2024-03-13 DIAGNOSIS — Z7952 Long term (current) use of systemic steroids: Secondary | ICD-10-CM | POA: Diagnosis not present

## 2024-03-13 DIAGNOSIS — Z860100 Personal history of colon polyps, unspecified: Secondary | ICD-10-CM

## 2024-03-13 DIAGNOSIS — I700 Atherosclerosis of aorta: Secondary | ICD-10-CM | POA: Diagnosis present

## 2024-03-13 DIAGNOSIS — Y838 Other surgical procedures as the cause of abnormal reaction of the patient, or of later complication, without mention of misadventure at the time of the procedure: Secondary | ICD-10-CM | POA: Diagnosis present

## 2024-03-13 DIAGNOSIS — M159 Polyosteoarthritis, unspecified: Secondary | ICD-10-CM | POA: Diagnosis present

## 2024-03-13 DIAGNOSIS — Z87891 Personal history of nicotine dependence: Secondary | ICD-10-CM | POA: Diagnosis not present

## 2024-03-13 LAB — GLUCOSE, CAPILLARY
Glucose-Capillary: 122 mg/dL — ABNORMAL HIGH (ref 70–99)
Glucose-Capillary: 211 mg/dL — ABNORMAL HIGH (ref 70–99)
Glucose-Capillary: 166 mg/dL — ABNORMAL HIGH (ref 70–99)
Glucose-Capillary: 222 mg/dL — ABNORMAL HIGH (ref 70–99)

## 2024-03-13 SURGERY — POSTERIOR LUMBAR FUSION 1 LEVEL
Anesthesia: General

## 2024-03-13 MED ORDER — CHLORHEXIDINE GLUCONATE CLOTH 2 % EX PADS: 6.0000 | MEDICATED_PAD | Freq: Once | CUTANEOUS | Status: DC

## 2024-03-13 MED ORDER — DEXAMETHASONE SOD PHOSPHATE PF 10 MG/ML IJ SOLN
INTRAMUSCULAR | Status: DC | PRN
  Administered 2024-03-13: 10 mg via INTRAVENOUS

## 2024-03-13 MED ORDER — LIDOCAINE HCL (CARDIAC) PF 100 MG/5ML IV SOSY
PREFILLED_SYRINGE | INTRAVENOUS | Status: DC | PRN
  Administered 2024-03-13: 40 mg via INTRAVENOUS

## 2024-03-13 MED ORDER — ACETAMINOPHEN 500 MG PO TABS
1000.0000 mg | ORAL_TABLET | Freq: Four times a day (QID) | ORAL | Status: DC
  Administered 2024-03-13: 1000 mg via ORAL
  Filled 2024-03-13: qty 2

## 2024-03-13 MED ORDER — OXYCODONE HCL 5 MG PO TABS: 5.0000 mg | ORAL_TABLET | Freq: Once | ORAL | Status: DC | PRN

## 2024-03-13 MED ORDER — SUGAMMADEX SODIUM 200 MG/2ML IV SOLN
INTRAVENOUS | Status: DC | PRN
  Administered 2024-03-13: 200 mg via INTRAVENOUS

## 2024-03-13 MED ORDER — DOCUSATE SODIUM 100 MG PO CAPS
100.0000 mg | ORAL_CAPSULE | Freq: Two times a day (BID) | ORAL | Status: DC
Start: 2024-03-13 — End: 2024-03-14

## 2024-03-13 MED ORDER — OXYCODONE HCL 5 MG PO TABS: 5.0000 mg | ORAL_TABLET | ORAL | Status: DC | PRN

## 2024-03-13 MED ORDER — BACITRACIN ZINC 500 UNIT/GM EX OINT
TOPICAL_OINTMENT | CUTANEOUS | Status: AC
Start: 2024-03-13 — End: 2024-03-13
  Filled 2024-03-13: qty 28.35

## 2024-03-13 MED ORDER — HYDROMORPHONE HCL 1 MG/ML IJ SOLN: 0.2500 mg | INTRAMUSCULAR | Status: DC | PRN

## 2024-03-13 MED ORDER — MEPERIDINE HCL 25 MG/ML IJ SOLN: 6.2500 mg | INTRAMUSCULAR | Status: DC | PRN

## 2024-03-13 MED ORDER — SODIUM CHLORIDE 0.9 % IV SOLN: 250.0000 mL | INTRAVENOUS | Status: DC

## 2024-03-13 MED ORDER — ACETAMINOPHEN 500 MG PO TABS
1000.0000 mg | ORAL_TABLET | Freq: Once | ORAL | Status: AC
  Administered 2024-03-13: 1000 mg via ORAL
  Filled 2024-03-13: qty 2

## 2024-03-13 MED ORDER — THROMBIN 5000 UNITS EX SOLR
OROMUCOSAL | Status: DC | PRN
  Administered 2024-03-13: 5 mL via TOPICAL

## 2024-03-13 MED ORDER — THROMBIN 5000 UNITS EX KIT
PACK | CUTANEOUS | Status: AC
  Filled 2024-03-13: qty 1

## 2024-03-13 MED ORDER — ROCURONIUM BROMIDE 10 MG/ML (PF) SYRINGE
PREFILLED_SYRINGE | INTRAVENOUS | Status: AC
  Filled 2024-03-13: qty 10

## 2024-03-13 MED ORDER — SODIUM CHLORIDE 0.9% FLUSH: 3.0000 mL | INTRAVENOUS | Status: DC | PRN

## 2024-03-13 MED ORDER — ONDANSETRON HCL 4 MG/2ML IJ SOLN
INTRAMUSCULAR | Status: DC | PRN
  Administered 2024-03-13: 4 mg via INTRAVENOUS

## 2024-03-13 MED ORDER — FENTANYL CITRATE (PF) 250 MCG/5ML IJ SOLN
INTRAMUSCULAR | Status: DC | PRN
  Administered 2024-03-13: 50 ug via INTRAVENOUS
  Administered 2024-03-13: 150 ug via INTRAVENOUS

## 2024-03-13 MED ORDER — MENTHOL 3 MG MT LOZG: 1.0000 | LOZENGE | OROMUCOSAL | Status: DC | PRN

## 2024-03-13 MED ORDER — CYCLOBENZAPRINE HCL 5 MG PO TABS: 5.0000 mg | ORAL_TABLET | Freq: Three times a day (TID) | ORAL | 1 refills | Status: AC | PRN

## 2024-03-13 MED ORDER — ALBUMIN HUMAN 5 % IV SOLN: INTRAVENOUS | Status: DC | PRN

## 2024-03-13 MED ORDER — 0.9 % SODIUM CHLORIDE (POUR BTL) OPTIME
TOPICAL | Status: DC | PRN
  Administered 2024-03-13: 1000 mL

## 2024-03-13 MED ORDER — SODIUM CHLORIDE 0.9% FLUSH: 3.0000 mL | Freq: Two times a day (BID) | INTRAVENOUS | Status: DC

## 2024-03-13 MED ORDER — CYCLOBENZAPRINE HCL 10 MG PO TABS: 10.0000 mg | ORAL_TABLET | Freq: Three times a day (TID) | ORAL | Status: DC | PRN

## 2024-03-13 MED ORDER — ORAL CARE MOUTH RINSE: 15.0000 mL | Freq: Once | OROMUCOSAL | Status: AC

## 2024-03-13 MED ORDER — OXYCODONE HCL 5 MG PO TABS: 10.0000 mg | ORAL_TABLET | ORAL | Status: DC | PRN

## 2024-03-13 MED ORDER — ONDANSETRON HCL 4 MG/2ML IJ SOLN: 4.0000 mg | Freq: Four times a day (QID) | INTRAMUSCULAR | Status: DC | PRN

## 2024-03-13 MED ORDER — VASHE WOUND IRRIGATION OPTIME
TOPICAL | Status: DC | PRN
  Administered 2024-03-13: 34 [oz_av] via TOPICAL

## 2024-03-13 MED ORDER — PROPOFOL 10 MG/ML IV BOLUS
INTRAVENOUS | Status: DC | PRN
  Administered 2024-03-13: 150 mg via INTRAVENOUS

## 2024-03-13 MED ORDER — PHENOL 1.4 % MT LIQD: 1.0000 | OROMUCOSAL | Status: DC | PRN

## 2024-03-13 MED ORDER — BISACODYL 10 MG RE SUPP: 10.0000 mg | Freq: Every day | RECTAL | Status: DC | PRN

## 2024-03-13 MED ORDER — OXYCODONE-ACETAMINOPHEN 5-325 MG PO TABS
1.0000 | ORAL_TABLET | ORAL | 0 refills | Status: AC | PRN
Start: 2024-03-13 — End: 2025-03-13

## 2024-03-13 MED ORDER — ONDANSETRON HCL 4 MG PO TABS: 4.0000 mg | ORAL_TABLET | Freq: Four times a day (QID) | ORAL | Status: DC | PRN

## 2024-03-13 MED ORDER — ROCURONIUM BROMIDE 100 MG/10ML IV SOLN
INTRAVENOUS | Status: DC | PRN
  Administered 2024-03-13: 60 mg via INTRAVENOUS

## 2024-03-13 MED ORDER — CEFAZOLIN SODIUM-DEXTROSE 2-4 GM/100ML-% IV SOLN
2.0000 g | Freq: Three times a day (TID) | INTRAVENOUS | Status: DC
  Administered 2024-03-13: 2 g via INTRAVENOUS
  Filled 2024-03-13: qty 100

## 2024-03-13 MED ORDER — TAMSULOSIN HCL 0.4 MG PO CAPS
0.4000 mg | ORAL_CAPSULE | Freq: Every day | ORAL | Status: DC
  Administered 2024-03-13: 0.4 mg via ORAL
  Filled 2024-03-13: qty 1

## 2024-03-13 MED ORDER — ATORVASTATIN CALCIUM 10 MG PO TABS
10.0000 mg | ORAL_TABLET | Freq: Every day | ORAL | Status: DC
  Administered 2024-03-13: 10 mg via ORAL
  Filled 2024-03-13: qty 1

## 2024-03-13 MED ORDER — INSULIN ASPART 100 UNIT/ML IJ SOLN
0.0000 [IU] | INTRAMUSCULAR | Status: DC
  Administered 2024-03-13: 7 [IU] via SUBCUTANEOUS

## 2024-03-13 MED ORDER — GABAPENTIN 300 MG PO CAPS: 300.0000 mg | ORAL_CAPSULE | Freq: Two times a day (BID) | ORAL | Status: DC

## 2024-03-13 MED ORDER — VANCOMYCIN HCL IN DEXTROSE 1-5 GM/200ML-% IV SOLN
1000.0000 mg | INTRAVENOUS | Status: AC
  Administered 2024-03-13: 1000 mg via INTRAVENOUS
  Filled 2024-03-13: qty 200

## 2024-03-13 MED ORDER — PHENYLEPHRINE HCL (PRESSORS) 10 MG/ML IV SOLN
INTRAVENOUS | Status: AC
  Filled 2024-03-13: qty 1

## 2024-03-13 MED ORDER — PROPOFOL 10 MG/ML IV BOLUS
INTRAVENOUS | Status: AC
  Filled 2024-03-13: qty 20

## 2024-03-13 MED ORDER — MORPHINE SULFATE (PF) 2 MG/ML IV SOLN: 2.0000 mg | INTRAVENOUS | Status: DC | PRN

## 2024-03-13 MED ORDER — BUPIVACAINE-EPINEPHRINE (PF) 0.5% -1:200000 IJ SOLN
INTRAMUSCULAR | Status: AC
  Filled 2024-03-13: qty 30

## 2024-03-13 MED ORDER — BUPIVACAINE-EPINEPHRINE (PF) 0.5% -1:200000 IJ SOLN
INTRAMUSCULAR | Status: DC | PRN
  Administered 2024-03-13: 10 mL via PERINEURAL

## 2024-03-13 MED ORDER — DOCUSATE SODIUM 100 MG PO CAPS: 100.0000 mg | ORAL_CAPSULE | Freq: Two times a day (BID) | ORAL | 0 refills | Status: AC

## 2024-03-13 MED ORDER — CHLORHEXIDINE GLUCONATE 0.12 % MT SOLN
OROMUCOSAL | Status: AC
  Administered 2024-03-13: 15 mL via OROMUCOSAL
  Filled 2024-03-13: qty 15

## 2024-03-13 MED ORDER — ACETAMINOPHEN 650 MG RE SUPP: 650.0000 mg | RECTAL | Status: DC | PRN

## 2024-03-13 MED ORDER — AMLODIPINE BESYLATE 10 MG PO TABS: 10.0000 mg | ORAL_TABLET | Freq: Every morning | ORAL | Status: DC

## 2024-03-13 MED ORDER — FENTANYL CITRATE (PF) 250 MCG/5ML IJ SOLN
INTRAMUSCULAR | Status: AC
  Filled 2024-03-13: qty 5

## 2024-03-13 MED ORDER — LACTATED RINGERS IV SOLN: INTRAVENOUS | Status: DC

## 2024-03-13 MED ORDER — ONDANSETRON HCL 4 MG/2ML IJ SOLN
INTRAMUSCULAR | Status: AC
  Filled 2024-03-13: qty 2

## 2024-03-13 MED ORDER — PHENYLEPHRINE HCL-NACL 20-0.9 MG/250ML-% IV SOLN
INTRAVENOUS | Status: DC | PRN
  Administered 2024-03-13: 50 ug/min via INTRAVENOUS

## 2024-03-13 MED ORDER — ACETAMINOPHEN 325 MG PO TABS: 650.0000 mg | ORAL_TABLET | ORAL | Status: DC | PRN

## 2024-03-13 MED ORDER — MIDAZOLAM HCL (PF) 2 MG/2ML IJ SOLN: 0.5000 mg | Freq: Once | INTRAMUSCULAR | Status: DC | PRN

## 2024-03-13 MED ORDER — PHENYLEPHRINE 80 MCG/ML (10ML) SYRINGE FOR IV PUSH (FOR BLOOD PRESSURE SUPPORT)
PREFILLED_SYRINGE | INTRAVENOUS | Status: AC
  Filled 2024-03-13: qty 20

## 2024-03-13 MED ORDER — LIDOCAINE 2% (20 MG/ML) 5 ML SYRINGE
INTRAMUSCULAR | Status: AC
  Filled 2024-03-13: qty 5

## 2024-03-13 MED ORDER — BUPIVACAINE LIPOSOME 1.3 % IJ SUSP
INTRAMUSCULAR | Status: AC
  Filled 2024-03-13: qty 20

## 2024-03-13 MED ORDER — INSULIN ASPART 100 UNIT/ML IJ SOLN
0.0000 [IU] | INTRAMUSCULAR | Status: DC | PRN
  Administered 2024-03-13: 2 [IU] via SUBCUTANEOUS
  Filled 2024-03-13: qty 1

## 2024-03-13 MED ORDER — OXYCODONE HCL 5 MG/5ML PO SOLN: 5.0000 mg | Freq: Once | ORAL | Status: DC | PRN

## 2024-03-13 MED ORDER — LOSARTAN POTASSIUM 50 MG PO TABS: 100.0000 mg | ORAL_TABLET | Freq: Every morning | ORAL | Status: DC

## 2024-03-13 MED ORDER — CHLORHEXIDINE GLUCONATE 0.12 % MT SOLN: 15.0000 mL | Freq: Once | OROMUCOSAL | Status: AC

## 2024-03-13 SURGICAL SUPPLY — 59 items
BAG COUNTER SPONGE SURGICOUNT (BAG) ×1 IMPLANT
BASKET BONE COLLECTION (BASKET) ×1 IMPLANT
BENZOIN TINCTURE PRP APPL 2/3 (GAUZE/BANDAGES/DRESSINGS) ×1 IMPLANT
BLADE CLIPPER SURG (BLADE) IMPLANT
BUR MATCHSTICK NEURO 3.0 LAGG (BURR) ×1 IMPLANT
BUR PRECISION FLUTE 6.0 (BURR) ×1 IMPLANT
CANISTER SUCTION 3000ML PPV (SUCTIONS) ×1 IMPLANT
CAP LOCK DLX THRD (Cap) IMPLANT
CLEANSER WND VASHE INSTL 34OZ (WOUND CARE) ×1 IMPLANT
CNTNR URN SCR LID CUP LEK RST (MISCELLANEOUS) ×1 IMPLANT
COVER BACK TABLE 60X90IN (DRAPES) ×1 IMPLANT
DRAPE C-ARM 42X72 X-RAY (DRAPES) ×2 IMPLANT
DRAPE HALF SHEET 40X57 (DRAPES) ×1 IMPLANT
DRAPE LAPAROTOMY 100X72X124 (DRAPES) ×1 IMPLANT
DRAPE SURG 17X23 STRL (DRAPES) ×1 IMPLANT
DRSG OPSITE POSTOP 4X10 (GAUZE/BANDAGES/DRESSINGS) IMPLANT
DRSG OPSITE POSTOP 4X6 (GAUZE/BANDAGES/DRESSINGS) ×1 IMPLANT
ELECTRODE BLDE 4.0 EZ CLN MEGD (MISCELLANEOUS) ×1 IMPLANT
ELECTRODE REM PT RTRN 9FT ADLT (ELECTROSURGICAL) ×1 IMPLANT
EVACUATOR 1/8 PVC DRAIN (DRAIN) ×1 IMPLANT
GAUZE 4X4 16PLY ~~LOC~~+RFID DBL (SPONGE) ×1 IMPLANT
GLOVE BIO SURGEON STRL SZ 6 (GLOVE) ×1 IMPLANT
GLOVE BIO SURGEON STRL SZ8 (GLOVE) ×2 IMPLANT
GLOVE BIO SURGEON STRL SZ8.5 (GLOVE) ×2 IMPLANT
GLOVE BIOGEL PI IND STRL 6.5 (GLOVE) ×1 IMPLANT
GOWN STRL REUS W/ TWL LRG LVL3 (GOWN DISPOSABLE) ×1 IMPLANT
GOWN STRL REUS W/ TWL XL LVL3 (GOWN DISPOSABLE) ×2 IMPLANT
GOWN STRL REUS W/TWL 2XL LVL3 (GOWN DISPOSABLE) IMPLANT
HEMOSTAT POWDER KIT SURGIFOAM (HEMOSTASIS) ×1 IMPLANT
KIT BASIN OR (CUSTOM PROCEDURE TRAY) ×1 IMPLANT
KIT GRAFTMAG DEL NEURO DISP (NEUROSURGERY SUPPLIES) IMPLANT
KIT INFUSE SMALL (Orthopedic Implant) IMPLANT
KIT POSITIONER JACKSON TABLE (MISCELLANEOUS) ×1 IMPLANT
KIT TURNOVER KIT B (KITS) ×1 IMPLANT
NDL HYPO 21X1.5 SAFETY (NEEDLE) ×1 IMPLANT
NDL HYPO 22X1.5 SAFETY MO (MISCELLANEOUS) ×1 IMPLANT
NEEDLE HYPO 21X1.5 SAFETY (NEEDLE) ×1 IMPLANT
NEEDLE HYPO 22X1.5 SAFETY MO (MISCELLANEOUS) ×1 IMPLANT
PACK LAMINECTOMY NEURO (CUSTOM PROCEDURE TRAY) ×1 IMPLANT
PAD ARMBOARD POSITIONER FOAM (MISCELLANEOUS) ×3 IMPLANT
PATTIES SURGICAL .5 X1 (DISPOSABLE) IMPLANT
PUTTY DBM 5CC CALC GRAN (Putty) IMPLANT
ROD CREO DLX CVD 6.35X40 (Rod) IMPLANT
SCREW PA DLX CREO 7.5X55 (Screw) IMPLANT
SCREW PA DLX CREO 8.5X55 (Screw) IMPLANT
SCREW PA DLX CREO 9.5X55 (Screw) IMPLANT
SOLN 0.9% NACL POUR BTL 1000ML (IV SOLUTION) ×1 IMPLANT
SOLN STERILE WATER BTL 1000 ML (IV SOLUTION) ×1 IMPLANT
SPIKE FLUID TRANSFER (MISCELLANEOUS) ×1 IMPLANT
SPONGE NEURO XRAY DETECT 1X3 (DISPOSABLE) IMPLANT
SPONGE SURGIFOAM ABS GEL 100 (HEMOSTASIS) IMPLANT
SPONGE T-LAP 4X18 ~~LOC~~+RFID (SPONGE) IMPLANT
STRIP CLOSURE SKIN 1/2X4 (GAUZE/BANDAGES/DRESSINGS) ×1 IMPLANT
SUT VIC AB 1 CT1 18XBRD ANBCTR (SUTURE) ×2 IMPLANT
SUT VIC AB 2-0 CP2 18 (SUTURE) ×2 IMPLANT
SYR 20ML LL LF (SYRINGE) IMPLANT
TOWEL GREEN STERILE (TOWEL DISPOSABLE) ×1 IMPLANT
TOWEL GREEN STERILE FF (TOWEL DISPOSABLE) ×1 IMPLANT
TRAY FOLEY MTR SLVR 16FR STAT (SET/KITS/TRAYS/PACK) ×1 IMPLANT

## 2024-03-13 NOTE — Discharge Instructions (Signed)

## 2024-03-13 NOTE — Discharge Summary (Signed)
 Physician Discharge Summary     Providing Compassionate, Quality Care - Together   Patient ID: Robert Zhang MRN: [id number] DOB/AGE: 80-Aug-1947 80 y.o.  Admit date: 03/13/2024 Discharge date: 03/13/2024  Admission Diagnoses: Lumbar pseudoarthrosis  Discharge Diagnoses:  Principal Problem:   Lumbar pseudoarthrosis   Discharged Condition: good  Hospital Course: Patient underwent redo L4-5 posterolateral arthrodesis by Dr. Mavis on 03/13/2024. He was admitted to 3C07 following recovery from anesthesia in the PACU. His postoperative course has been uncomplicated. He is ambulating independently and without difficulty. He is tolerating a normal diet. He is not having any bowel or bladder dysfunction. His pain is well-controlled with oral pain medication. He is ready for discharge home.   Consults: None  Significant Diagnostic Studies: radiology: DG Lumbar Spine 2-3 Views Result Date: 03/13/2024 CLINICAL DATA:  Elective surgery EXAM: LUMBAR SPINE - 2-3 VIEW COMPARISON:  Preoperative imaging FINDINGS: Two fluoroscopic spot views of the lumbar spine submitted from the operating room. Posterior rod and pedicle screw fixation is seen at L4-L5 with interbody spacer. Fluoroscopy time 3 seconds. Dose 2.12 mGy. IMPRESSION: Intraoperative fluoroscopy during lumbar surgery. Electronically Signed   By: Andrea Gasman M.D.   On: 03/13/2024 15:42   DG C-Arm 1-60 Min-No Report Result Date: 03/13/2024 Fluoroscopy was utilized by the requesting physician.  No radiographic interpretation.     Treatments: surgery: Exploration of lumbar fusion/removal of lumbar hardware; redo L4-5 posterolateral arthrodesis with local morselized autograft bone, bone morphogenic protein soaked collagen sponges, and intra grow bone graft extender; posterior nonsegmental instrumentation with globus titanium pedicle screws and rods.  Discharge Exam: Blood pressure (!) 173/86, pulse (!) 101, temperature 98.8 F (37.1  C), resp. rate 18, height 5' 8 (1.727 m), weight 73.9 kg, SpO2 97%.  Per report: Alert and oriented x 4 PERRLA CN II-XII grossly intact MAE, Strength and sensation intact Incision is covered with Honeycomb dressing and Steri Strips; Dressing is clean, dry, and intact   Disposition: Discharge disposition: 01-Home or Self Care       Discharge Instructions     Call MD for:  difficulty breathing, headache or visual disturbances   Complete by: As directed    Call MD for:  hives   Complete by: As directed    Call MD for:  persistant nausea and vomiting   Complete by: As directed    Call MD for:  redness, tenderness, or signs of infection (pain, swelling, redness, odor or green/yellow discharge around incision site)   Complete by: As directed    Call MD for:  severe uncontrolled pain   Complete by: As directed    Diet - low sodium heart healthy   Complete by: As directed    If the dressing is still on your incision site when you go home, remove it on the third day after your surgery date. Remove dressing if it begins to fall off, or if it is dirty or damaged before the third day.   Complete by: As directed    Increase activity slowly   Complete by: As directed       Allergies as of 03/13/2024       Reactions   Mirabegron    Pt states he doesn't have a reaction to this medication but can not take it because he takes hctz   Tadalafil     Dropped blood pressure too low   Zetia [ezetimibe]    Muscle pain   Penicillins Rash, Other (See Comments)   Childhood allergy Has patient  had a PCN reaction causing immediate rash, facial/tongue/throat swelling, SOB or lightheadedness with hypotension: yes Has patient had a PCN reaction causing severe rash involving mucus membranes or skin necrosis: no Has patient had a PCN reaction that required hospitalization no Has patient had a PCN reaction occurring within the last 10 years: no If all of the above answers are NO, then may proceed  with Cephalosporin use.        Medication List     TAKE these medications    amLODipine  10 MG tablet Commonly known as: NORVASC  Take 10 mg by mouth in the morning.   aspirin EC 81 MG tablet Take 81 mg by mouth in the morning.   atorvastatin 10 MG tablet Commonly known as: LIPITOR Take 10 mg by mouth at bedtime.   carboxymethylcellulose 0.5 % Soln Commonly known as: REFRESH PLUS Place 1 drop into both eyes 3 (three) times daily as needed (dry/irritated eyes.).   cyclobenzaprine  5 MG tablet Commonly known as: FLEXERIL  Take 1 tablet (5 mg total) by mouth 3 (three) times daily as needed for muscle spasms.   diclofenac Sodium 1 % Gel Commonly known as: VOLTAREN Apply 2 g topically 3 (three) times daily as needed (joint pain).   docusate sodium  100 MG capsule Commonly known as: COLACE Take 1 capsule (100 mg total) by mouth 2 (two) times daily.   gabapentin  300 MG capsule Commonly known as: NEURONTIN  Take 300 mg by mouth 2 (two) times daily.   losartan  100 MG tablet Commonly known as: COZAAR  Take 100 mg by mouth in the morning.   ONE TOUCH ULTRA TEST test strip Generic drug: glucose blood   onetouch ultrasoft lancets   oxyCODONE -acetaminophen  5-325 MG tablet Commonly known as: Percocet Take 1-2 tablets by mouth every 4 (four) hours as needed.   predniSONE 10 MG tablet Commonly known as: DELTASONE Take 10 mg by mouth daily as needed (gout attacks).   tamsulosin  0.4 MG Caps capsule Commonly known as: FLOMAX  Take 1 capsule (0.4 mg total) by mouth daily after supper.               Discharge Care Instructions  (From admission, onward)           Start     Ordered   03/13/24 0000  If the dressing is still on your incision site when you go home, remove it on the third day after your surgery date. Remove dressing if it begins to fall off, or if it is dirty or damaged before the third day.        03/13/24 1824            Follow-up Information      Mavis Purchase, MD. Go on 04/04/2024.   Specialty: Neurosurgery Why: First post op appointment with x-rays is on 04/04/2024 at 8:15 AM. Contact information: [address] [place] [address] [phone]                 Signed: Gerard Beck, DNP, AGNP-C Nurse Practitioner  [facility] [address], [address], [place], [place] [address] P: [phone]    F: [phone]  03/13/2024, 6:24 PM

## 2024-03-13 NOTE — H&P (Signed)
 Subjective: The patient is a 78 year old white male on whom I performed a lumbar fusion.  He initially improved but developed recurrent worsening back pain.  He failed medical management.  He was worked up with a lumbar CT which demonstrated good position of the instrumentation at L4-5 but some evidence of loosening indicative of pseudoarthrosis.  I discussed the various treatment options with him.  He has decided proceed with surgery.  Past Medical History:  Diagnosis Date   Anxiety    PTSD   Aortic atherosclerosis 10/12/2017   Noted on CT    Arthritis    bilateral legs /   Neck-Degenerative Disc Disease   Basal cell carcinoma    Right side of nose   Carotid artery occlusion    Right Carotid    Cervical spondylosis    Cervical stenosis of spine    Degenerative disc disease, cervical    C4-7   Diabetes mellitus without complication (HCC)    diet and exercise controlled, no medications, hgb A1C 07/31/2017 (6.9)   Erectile dysfunction 07/31/2020   External hemorrhoids    Hepatic cyst 10/12/2017   Low density lession right hepatic lobe, noted on CT   Hepatitis    History of colon polyps    Hypertension    Leg cramps    Neuromuscular disorder (HCC)    Neuropathy bilateral feet   Peripheral vascular disease    Right carotid Artery Stenosis   Prostate CA (HCC)    Protrusion of intervertebral disc of lumbosacral region    L5-S1   Pulmonary nodule 11/26/2016   2mm subpleural right lower lobe, noted on CT ABD/PELVIS   Right shoulder tendinitis    Stroke (HCC) 2016   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    Past Surgical History:  Procedure Laterality Date   BROW LIFT Bilateral 09/17/2022   Procedure: Bilateral upper lid blepharoplasty and excision of right cheek cyst;  Surgeon: Lowery Estefana RAMAN, DO;  Location: [facility];  Service: Plastics;  Laterality: Bilateral;   CATARACT EXTRACTION W/PHACO  07/20/2011   Procedure: CATARACT EXTRACTION PHACO AND  INTRAOCULAR LENS PLACEMENT (IOC);  Surgeon: Dow JULIANNA Burke, MD;  Location: [facility];  Service: Ophthalmology;  Laterality: Right;  CDE=25.73   CATARACT EXTRACTION W/PHACO  08/31/2011   Procedure: CATARACT EXTRACTION PHACO AND INTRAOCULAR LENS PLACEMENT (IOC);  Surgeon: Dow JULIANNA Burke, MD;  Location: [facility];  Service: Ophthalmology;  Laterality: Left;  CDE: 38.59   COLONOSCOPY N/A 09/10/2016   Procedure: COLONOSCOPY;  Surgeon: Claudis RAYMOND Rivet, MD;  Location: [facility];  Service: Endoscopy;  Laterality: N/A;  1200   COLONOSCOPY W/ POLYPECTOMY  2011   [facility]-Dr. Rehman   COLONOSCOPY WITH PROPOFOL  N/A 10/15/2021   Procedure: COLONOSCOPY WITH PROPOFOL ;  Surgeon: Rivet Claudis RAYMOND, MD;  Location: [facility];  Service: Endoscopy;  Laterality: N/A;  1020   cortisone injection Left 01/13/2016   left shoulder   cortisone injection Left 09/21/2017   left shoulder   CYST EXCISION Right 09/17/2022   Procedure: CYST REMOVAL;  Surgeon: Lowery Estefana RAMAN, DO;  Location: [facility];  Service: Plastics;  Laterality: Right;   CYSTOSCOPY  01/20/2018   Procedure: CYSTOSCOPY FLEXIBLE;  Surgeon: Sherrilee Belvie CROME, MD;  Location: [facility];  Service: Urology;;  no seeds found in bladder   CYSTOSCOPY  07/22/2022   Procedure: CYSTOSCOPY FLEXIBLE, FOLEY INSERTION, DILATION OF URETHRAL STRICTURE;  Surgeon: Roseann Adine PARAS., MD;  Location: [facility]  OR;  Service: Urology;;   EYE SURGERY     laser surgery to repair retina tare   head reconstruction  1966   due to head injury in war   LESION REMOVAL Right 11/11/2022   Procedure: reexcision and possible closure right cheek BCC;  Surgeon: Lowery Estefana RAMAN, DO;  Location: [facility];  Service: Plastics;  Laterality: Right;   LIPOMA EXCISION Left 02/01/2019   Procedure: EXCISION LIPOMA LEFT UPPER EXTREMITY;  Surgeon: Mavis Anes, MD;  Location: [facility];  Service: General;  Laterality: Left;   LUMBAR  FUSION  07/2022   Dr. Reyes Mavis   PROSTATE BIOPSY  05/20/2016   RADIOACTIVE SEED IMPLANT N/A 01/20/2018   Procedure: RADIOACTIVE SEED IMPLANT/BRACHYTHERAPY IMPLANT;  Surgeon: Sherrilee Belvie CROME, MD;  Location: [facility];  Service: Urology;  Laterality: N/A;     86    seeds implanted   REFRACTIVE SURGERY Left 03/05/2017   to resolve cloudiness   SPACE OAR INSTILLATION N/A 01/20/2018   Procedure: SPACE OAR INSTILLATION;  Surgeon: Sherrilee Belvie CROME, MD;  Location: [facility];  Service: Urology;  Laterality: N/A;   TRIGGER FINGER RELEASE      Allergies  Allergen Reactions   Mirabegron     Pt states he doesn't have a reaction to this medication but can not take it because he takes hctz   Tadalafil      Dropped blood pressure too low   Zetia [Ezetimibe]     Muscle pain   Penicillins Rash and Other (See Comments)    Childhood allergy Has patient had a PCN reaction causing immediate rash, facial/tongue/throat swelling, SOB or lightheadedness with hypotension: yes Has patient had a PCN reaction causing severe rash involving mucus membranes or skin necrosis: no Has patient had a PCN reaction that required hospitalization no Has patient had a PCN reaction occurring within the last 10 years: no If all of the above answers are NO, then may proceed with Cephalosporin use.      Social History   Tobacco Use   Smoking status: Former    Current packs/day: 0.00    Average packs/day: 1 pack/day for 13.0 years (13.0 ttl pk-yrs)    Types: Cigarettes    Start date: 04/18/1963    Quit date: 04/17/1976    Years since quitting: 47.9    Passive exposure: Never   Smokeless tobacco: Never  Substance Use Topics   Alcohol  use: Not Currently    Alcohol /week: 4.0 - 6.0 standard drinks of alcohol     Types: 4 - 6 Shots of liquor per week    Family History  Problem Relation Age of Onset   Diabetes Mother    Hypertension Father    Heart disease Father         before age 35   Heart attack Father    Pancreatic cancer Brother    Anesthesia problems Neg Hx    Hypotension Neg Hx    Malignant hyperthermia Neg Hx    Pseudochol deficiency Neg Hx    Prior to Admission medications   Medication Sig Start Date End Date Taking? Authorizing Provider  amLODipine  (NORVASC ) 10 MG tablet Take 10 mg by mouth in the morning. 06/03/23  Yes [provider]  aspirin EC 81 MG tablet Take 81 mg by mouth in the morning. 09/11/16  Yes Rehman, Claudis PENNER, MD  atorvastatin (LIPITOR) 10 MG tablet Take 10 mg by mouth at bedtime.   Yes [provider]  carboxymethylcellulose (  REFRESH PLUS) 0.5 % SOLN Place 1 drop into both eyes 3 (three) times daily as needed (dry/irritated eyes.).   Yes [provider]  diclofenac Sodium (VOLTAREN) 1 % GEL Apply 2 g topically 3 (three) times daily as needed (joint pain). 08/28/21  Yes [provider]  gabapentin  (NEURONTIN ) 300 MG capsule Take 300 mg by mouth 2 (two) times daily. 06/19/20  Yes [provider]  losartan  (COZAAR ) 100 MG tablet Take 100 mg by mouth in the morning. 07/08/22  Yes [provider]  predniSONE (DELTASONE) 10 MG tablet Take 10 mg by mouth daily as needed (gout attacks). 02/24/23  Yes [provider]  tamsulosin  (FLOMAX ) 0.4 MG CAPS capsule Take 1 capsule (0.4 mg total) by mouth daily after supper. 08/20/21  Yes McKenzie, Belvie CROME, MD  Lancets JANETT ULTRASOFT) lancets  05/23/17   [provider]  ONE TOUCH ULTRA TEST test strip  05/23/17   [provider]     Review of Systems  Positive ROS: As above  All other systems have been reviewed and were otherwise negative with the exception of those mentioned in the HPI and as above.  Objective: Vital signs in last 24 hours: Temp:  [97.9 F (36.6 C)] 97.9 F (36.6 C) (10/27 0911) Pulse Rate:  [85] 85 (10/27 0911) Resp:  [18] 18 (10/27 0911) BP: (180)/(82) 180/82 (10/27 0911) SpO2:  [98 %] 98 %  (10/27 0911) Weight:  [73.9 kg] 73.9 kg (10/27 0911) Estimated body mass index is 24.78 kg/m as calculated from the following:   Height as of this encounter: 5' 8 (1.727 m).   Weight as of this encounter: 73.9 kg.   General Appearance: Alert Head: Normocephalic, without obvious abnormality, atraumatic Eyes: PERRL, conjunctiva/corneas clear, EOM's intact,    Ears: Normal  Throat: Normal  Neck: Supple, Back: His lumbar incision is well-healed. Lungs: Clear to auscultation bilaterally, respirations unlabored Heart: Regular rate and rhythm, no murmur, rub or gallop Abdomen: Soft, non-tender Extremities: Extremities normal, atraumatic, no cyanosis or edema Skin: unremarkable  NEUROLOGIC:   Mental status: alert and oriented,Motor Exam - grossly normal Sensory Exam - grossly normal Reflexes:  Coordination - grossly normal Gait - grossly normal Balance - grossly normal Cranial Nerves: I: smell Not tested  II: visual acuity  OS: Normal  OD: Normal   II: visual fields Full to confrontation  II: pupils Equal, round, reactive to light  III,VII: ptosis None  III,IV,VI: extraocular muscles  Full ROM  V: mastication Normal  V: facial light touch sensation  Normal  V,VII: corneal reflex  Present  VII: facial muscle function - upper  Normal  VII: facial muscle function - lower Normal  VIII: hearing Not tested  IX: soft palate elevation  Normal  IX,X: gag reflex Present  XI: trapezius strength  5/5  XI: sternocleidomastoid strength 5/5  XI: neck flexion strength  5/5  XII: tongue strength  Normal    Data Review Lab Results  Component Value Date   WBC 8.7 03/09/2024   HGB 15.4 03/09/2024   HCT 47.1 03/09/2024   MCV 93.3 03/09/2024   PLT 211 03/09/2024   Lab Results  Component Value Date   NA 139 03/09/2024   K 3.8 03/09/2024   CL 102 03/09/2024   CO2 25 03/09/2024   BUN 16 03/09/2024   CREATININE 0.93 03/09/2024   GLUCOSE 152 (H) 03/09/2024   Lab Results  Component  Value Date   INR 0.95 01/13/2018    Assessment/Plan: Lumbar  pseudoarthrosis, lumbago: I discussed the situation with the patient.  I reviewed his CT scan with him and pointed out the abnormalities.  We discussed the various treatment options including surgery.  I have described the surgical treatment option of a exploration of his lumbar fusion with an L4-5 redo instrumentation and fusion.  I have shown him surgical models.  I have given him a surgical pamphlet.  We have discussed the risk, benefits, alternatives, expected postoperative course, and likelihood of achieving our goals with surgery.  I have answered all his questions.  He has decided proceed with surgery.   Reyes JONETTA Budge 03/13/2024 12:10 PM

## 2024-03-13 NOTE — Anesthesia Procedure Notes (Signed)
 Procedure Name: Intubation Date/Time: 03/13/2024 1:12 PM  Performed by: Obadiah Reyes BROCKS, CRNAPre-anesthesia Checklist: Patient identified, Emergency Drugs available, Suction available and Patient being monitored Patient Re-evaluated:Patient Re-evaluated prior to induction Oxygen Delivery Method: Circle System Utilized Preoxygenation: Pre-oxygenation with 100% oxygen Induction Type: IV induction Ventilation: Mask ventilation without difficulty Laryngoscope Size: Miller and 2 Grade View: Grade I Tube type: Oral Tube size: 7.5 mm Number of attempts: 1 Airway Equipment and Method: Stylet and Oral airway Placement Confirmation: ETT inserted through vocal cords under direct vision, positive ETCO2 and breath sounds checked- equal and bilateral Secured at: 23 cm Tube secured with: Tape Dental Injury: Teeth and Oropharynx as per pre-operative assessment

## 2024-03-13 NOTE — Transfer of Care (Signed)
 Immediate Anesthesia Transfer of Care Note  Patient: Robert Zhang  Procedure(s) Performed: POSTERIOR LUMBAR FUSION REVISION LUMBAR FOUR-FIVE  Patient Location: PACU  Anesthesia Type:General  Level of Consciousness: sedated and responds to stimulation  Airway & Oxygen Therapy: Patient Spontanous Breathing and Patient connected to face mask oxygen  Post-op Assessment: Report given to RN and Post -op Vital signs reviewed and stable  Post vital signs: Reviewed and stable  Last Vitals:  Vitals Value Taken Time  BP 134/68 03/13/24 15:41  Temp    Pulse 82 03/13/24 15:45  Resp 14 03/13/24 15:45  SpO2 99 % 03/13/24 15:45  Vitals shown include unfiled device data.  Last Pain:  Vitals:   03/13/24 0911  TempSrc: Oral         Complications: No notable events documented.

## 2024-03-13 NOTE — Op Note (Signed)
 Brief history: The patient is a 78 year old white male on whom I performed a L4-5 fusion in March 2025.  He initially made a good recovery but then developed recurrent and worsening back pain.  He failed medical management.  He was worked up with a lumbar CT which demonstrated loosening of the screws indicative of a pseudoarthrosis.  I discussed the various treatment options with him.  He has decided proceed with surgery.  Preoperative diagnosis: Lumbar pseudoarthrosis, lumbago   Postoperative diagnosis: The same   Procedure: Exploration of lumbar fusion/removal of lumbar hardware; redo L4-5 posterolateral arthrodesis with local morselized autograft bone, bone morphogenic protein soaked collagen sponges, and intra grow bone graft extender; posterior nonsegmental instrumentation with globus titanium pedicle screws and rods  Surgeon: Dr. Chyrl Budge  Asst.: Duwaine Beck, NP  Anesthesia: Gen. endotracheal  Estimated blood loss: 125 cc  Drains: Medium Hemovac drain   Complications: None  Description of procedure: The patient was brought to the operating room by the anesthesia team. General endotracheal anesthesia was induced. The patient was turned to the prone position on the Wilson frame. The patient's lumbosacral region was then prepared with Betadine  scrub and Betadine  solution. Sterile drapes were applied.  I then injected the area to be incised with Marcaine  with epinephrine  solution. I then used the scalpel to make a linear midline incision over the L4-5 interspace, incising through the old surgical scar. I then used electrocautery to perform a bilateral subperiosteal dissection exposing the spinous process and lamina of L4-5 and the old hardware at L4-5. We then inserted the Verstrac retractor to provide exposure.  We explored the fusion by removing the caps from the old screws.  We then remove the rods.  The screws at L4 bilaterally and L5 on the left were loose indicative of  pseudoarthrosis.  I removed all 4 screws.  We now turned attention to the instrumentation. Under fluoroscopic guidance I inserted a 7.5 x 55 pedicle screw into the right L5 pedicle and 8.5 x 55 into the left L4 pedicle a 9.5 x 55 into the right L4 pedicle and a 9.5 x 55 into the left L5 pedicle.  A good good bony purchase at each level.  We then connected the unilateral pedicle screws with a lordotic rod. We secured the rod in place with the caps. We then tightened the caps appropriately. This completed the instrumentation from L4-5 bilaterally.  We now turned our attention to the redo posterior lateral arthrodesis at L4-5.  I used electrocautery to expose the remainder of the facets pars lamina and transverse processes at L4-5 bilaterally.  We used the high-speed drill to decorticate the remainder of the facets, pars, transverse process at 4 5 bilaterally. We then applied a combination of bone morphogenic protein soaked collagen sponges, local morselized autograft bone and Zimmer DBM over these decorticated posterior lateral structures. This completed the redo posterior lateral arthrodesis L4-5 bilaterally.  We then obtained hemostasis using bipolar electrocautery. We irrigated the wound out with vashe solution.We then removed the retractor.  I placed a Hemovac drain and tunneled it out through a separate stab wound.  We reapproximated patient's thoracolumbar fascia with interrupted #1 Vicryl suture. We reapproximated patient's subcutaneous tissue with interrupted 2-0 Vicryl suture. The reapproximated patient's skin with Steri-Strips and benzoin. The wound was then coated with bacitracin  ointment. A sterile dressing was applied. The drapes were removed. The patient was subsequently returned to the supine position where they were extubated by the anesthesia team. He was then transported  to the post anesthesia care unit in stable condition. All sponge instrument and needle counts were reportedly correct at the  end of this case.

## 2024-03-13 NOTE — Anesthesia Procedure Notes (Signed)
 Procedure Name: Intubation Date/Time: 03/13/2024 1:12 PM  Performed by: Obadiah Reyes BROCKS, CRNAPre-anesthesia Checklist: Patient identified, Emergency Drugs available, Suction available and Patient being monitored Patient Re-evaluated:Patient Re-evaluated prior to induction Oxygen Delivery Method: Circle System Utilized Preoxygenation: Pre-oxygenation with 100% oxygen Induction Type: IV induction Ventilation: Mask ventilation without difficulty Laryngoscope Size: Mac and 4 Grade View: Grade I Tube type: Oral Tube size: 7.5 mm Number of attempts: 1 Airway Equipment and Method: Stylet and Oral airway Placement Confirmation: ETT inserted through vocal cords under direct vision, positive ETCO2 and breath sounds checked- equal and bilateral Secured at: 21 cm Tube secured with: Tape Dental Injury: Teeth and Oropharynx as per pre-operative assessment

## 2024-03-13 NOTE — Progress Notes (Signed)
 Orthopedic Tech Progress Note Patient Details:  Robert Zhang 1946/02/27 [id number] Patient did not want it applied onto himself. He stated he will put it on when he gets home. Ortho Devices Type of Ortho Device: Lumbar corsett Ortho Device/Splint Location: L SPINE Ortho Device/Splint Interventions: Ordered   Post Interventions Patient Tolerated: Well Instructions Provided: Care of device  [name] [name] 03/13/2024, 5:19 PM

## 2024-03-13 NOTE — Plan of Care (Signed)
 Pt doing well. Pt and family given D/C instructions with verbal understanding. Rx's were sent to the pharmacy by MD. Pt's incision is clean and dry with no sign of infection. Pt's IV and Hemovac were removed prior to D/C. Pt D/C'd home via wheelchair per MD order. Pt is stable @ D/C and has no other needs at this time. Rosina Rakers, RN

## 2024-03-14 NOTE — Anesthesia Postprocedure Evaluation (Signed)
 Anesthesia Post Note  Patient: Robert Zhang  Procedure(s) Performed: POSTERIOR LUMBAR FUSION REVISION LUMBAR FOUR-FIVE     Patient location during evaluation: PACU Anesthesia Type: General Level of consciousness: awake and alert Pain management: pain level controlled Vital Signs Assessment: post-procedure vital signs reviewed and stable Respiratory status: spontaneous breathing, nonlabored ventilation, respiratory function stable and patient connected to nasal cannula oxygen Cardiovascular status: blood pressure returned to baseline and stable Postop Assessment: no apparent nausea or vomiting Anesthetic complications: no   No notable events documented.  Last Vitals:  Vitals:   03/13/24 1630 03/13/24 1701  BP: (!) 156/97 (!) 173/86  Pulse: 99 (!) 101  Resp: 20 18  Temp: 36.7 C 37.1 C  SpO2: 95% 97%    Last Pain:  Vitals:   03/13/24 1715  TempSrc:   PainSc: 0-No pain                 [name]

## 2024-03-17 MED FILL — Sodium Chloride IV Soln 0.9%: INTRAVENOUS | Qty: 2000 | Status: AC

## 2024-03-17 MED FILL — Heparin Sodium (Porcine) Inj 1000 Unit/ML: INTRAMUSCULAR | Qty: 30 | Status: AC

## 2024-03-27 ENCOUNTER — Ambulatory Visit: Admitting: Orthopedic Surgery

## 2024-03-27 ENCOUNTER — Encounter: Payer: Self-pay | Admitting: Orthopedic Surgery

## 2024-03-27 VITALS — Ht 68.0 in | Wt 163.0 lb

## 2024-03-27 DIAGNOSIS — E1129 Type 2 diabetes mellitus with other diabetic kidney complication: Secondary | ICD-10-CM | POA: Insufficient documentation

## 2024-03-27 DIAGNOSIS — G609 Hereditary and idiopathic neuropathy, unspecified: Secondary | ICD-10-CM | POA: Insufficient documentation

## 2024-03-27 DIAGNOSIS — M19011 Primary osteoarthritis, right shoulder: Secondary | ICD-10-CM | POA: Diagnosis not present

## 2024-03-27 DIAGNOSIS — M75121 Complete rotator cuff tear or rupture of right shoulder, not specified as traumatic: Secondary | ICD-10-CM | POA: Diagnosis not present

## 2024-03-27 DIAGNOSIS — M545 Low back pain, unspecified: Secondary | ICD-10-CM | POA: Insufficient documentation

## 2024-03-27 DIAGNOSIS — I639 Cerebral infarction, unspecified: Secondary | ICD-10-CM | POA: Insufficient documentation

## 2024-03-27 DIAGNOSIS — M25552 Pain in left hip: Secondary | ICD-10-CM | POA: Insufficient documentation

## 2024-03-27 DIAGNOSIS — Z981 Arthrodesis status: Secondary | ICD-10-CM | POA: Insufficient documentation

## 2024-03-27 DIAGNOSIS — R079 Chest pain, unspecified: Secondary | ICD-10-CM | POA: Insufficient documentation

## 2024-03-27 DIAGNOSIS — M25551 Pain in right hip: Secondary | ICD-10-CM | POA: Insufficient documentation

## 2024-03-27 DIAGNOSIS — M4150 Other secondary scoliosis, site unspecified: Secondary | ICD-10-CM | POA: Insufficient documentation

## 2024-03-27 DIAGNOSIS — I6529 Occlusion and stenosis of unspecified carotid artery: Secondary | ICD-10-CM | POA: Insufficient documentation

## 2024-03-27 DIAGNOSIS — I491 Atrial premature depolarization: Secondary | ICD-10-CM | POA: Insufficient documentation

## 2024-03-27 DIAGNOSIS — E876 Hypokalemia: Secondary | ICD-10-CM | POA: Insufficient documentation

## 2024-03-27 DIAGNOSIS — B351 Tinea unguium: Secondary | ICD-10-CM | POA: Insufficient documentation

## 2024-03-27 DIAGNOSIS — H903 Sensorineural hearing loss, bilateral: Secondary | ICD-10-CM | POA: Insufficient documentation

## 2024-03-27 DIAGNOSIS — I619 Nontraumatic intracerebral hemorrhage, unspecified: Secondary | ICD-10-CM | POA: Insufficient documentation

## 2024-03-27 DIAGNOSIS — G5621 Lesion of ulnar nerve, right upper limb: Secondary | ICD-10-CM | POA: Insufficient documentation

## 2024-03-27 DIAGNOSIS — M19019 Primary osteoarthritis, unspecified shoulder: Secondary | ICD-10-CM

## 2024-03-27 DIAGNOSIS — M47816 Spondylosis without myelopathy or radiculopathy, lumbar region: Secondary | ICD-10-CM | POA: Insufficient documentation

## 2024-03-27 NOTE — Progress Notes (Signed)
  Intake history:  Chief Complaint  Patient presents with   Shoulder Pain    Right     Right shoulder   How long has this bothered you? (DOI?DOS?WS?)  Since Feb this year   Was there an injury? No  78 year old male status post recent lumbar fusion doing well.  Patient will see Dr. Mavis in 2 weeks and asking if he can have a reverse total shoulder arthroplasty  He comes in complaining of pain weakness and decreased range of motion in his right shoulder  After discussing options with him he is interested in surgical intervention  His exam shows abduction to 90 degrees external rotation when his arm is at his side 45 degrees  He has pain from 90 degrees abduction active through passive range of motion of 120 degrees in the scapular plane  The rotator cuff itself has some weakness but it would be grade 4 out of 5 in abduction and flexion  No instability  Neurovascular exam intact  Prior imaging studies include an MRI  MRI was done December 26, 2023 and has 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 Moderate partially scarred loss of the glenohumeral joint.  1. High-grade partial-thickness bursal surface tear along the anterior half and a small full-thickness tear more posteriorly measuring 9 mm AP. 2. Severe infraspinatus tendinosis with a large full-thickness tear at the musculotendinous junction and 3 cm of retraction. Severe muscle strain of the infraspinatus muscle. 3. Mild subscapularis tendinosis with a tiny interstitial tear. 4. Complete tear of the long of the biceps tendon.     Electronically Signed   By: Julaine Blanch M.D.   On: 01/13/2024 13:30   Anticoag.  No   Any ALLERGIES _________ Allergies  Allergen Reactions   Mirabegron     Pt states he doesn't have a reaction to this  medication but can not take it because he takes hctz   Tadalafil      Dropped blood pressure too low   Zetia [Ezetimibe]     Muscle pain   Penicillins Rash and Other (See Comments)    Childhood allergy Has patient had a PCN reaction causing immediate rash, facial/tongue/throat swelling, SOB or lightheadedness with hypotension: yes Has patient had a PCN reaction causing severe rash involving mucus membranes or skin necrosis: no Has patient had a PCN reaction that required hospitalization no Has patient had a PCN reaction occurring within the last 10 years: no If all of the above answers are NO, then may proceed with Cephalosporin use.     _____________________________________   Treatment:  Have you taken:  Tylenol  No  Advil Yes  Had PT No/ has had recent back surgery doesn't want PT right now   Had injection Yes  Other  _________________________

## 2024-03-27 NOTE — Patient Instructions (Signed)
 Ask Dr Mavis about when you can have shoulder surgery, for a shoulder replacement You can call to schedule when you are ready to proceed with surgery with Dr Onesimo [phone]

## 2024-03-27 NOTE — Progress Notes (Signed)
  Intake history:  Chief Complaint  Patient presents with   Shoulder Pain    Right      Ht 5' 8 (1.727 m)   Wt 163 lb (73.9 kg)   BMI 24.78 kg/m  Body mass index is 24.78 kg/m.  Pharmacy? _____[facility]_________________________________  WHAT ARE WE SEEING YOU FOR TODAY?   Right shoulder   How long has this bothered you? (DOI?DOS?WS?)  Since Feb this year   Was there an injury? No  IMPRESSION: 1. High-grade partial-thickness bursal surface tear along the anterior half and a small full-thickness tear more posteriorly measuring 9 mm AP. 2. Severe infraspinatus tendinosis with a large full-thickness tear at the musculotendinous junction and 3 cm of retraction. Severe muscle strain of the infraspinatus muscle. 3. Mild subscapularis tendinosis with a tiny interstitial tear. 4. Complete tear of the long of the biceps tendon.     Electronically Signed   By: Julaine Blanch M.D.   On: 01/13/2024 13:30   Anticoag.  No   Any ALLERGIES _________ Allergies  Allergen Reactions   Mirabegron     Pt states he doesn't have a reaction to this medication but can not take it because he takes hctz   Tadalafil      Dropped blood pressure too low   Zetia [Ezetimibe]     Muscle pain   Penicillins Rash and Other (See Comments)    Childhood allergy Has patient had a PCN reaction causing immediate rash, facial/tongue/throat swelling, SOB or lightheadedness with hypotension: yes Has patient had a PCN reaction causing severe rash involving mucus membranes or skin necrosis: no Has patient had a PCN reaction that required hospitalization no Has patient had a PCN reaction occurring within the last 10 years: no If all of the above answers are NO, then may proceed with Cephalosporin use.     _____________________________________   Treatment:  Have you taken:  Tylenol  No  Advil Yes  Had PT No/ has had recent back surgery doesn't want PT right now   Had injection Yes  Other   _________________________
